# Patient Record
Sex: Male | Born: 1955 | Race: White | Hispanic: No | Marital: Single | Smoking: Current every day smoker
Health system: Southern US, Community
[De-identification: ages and names within clinical notes are randomized; demographics above are authoritative.]

## PROBLEM LIST (undated history)

## (undated) DIAGNOSIS — J449 Chronic obstructive pulmonary disease, unspecified: Secondary | ICD-10-CM

## (undated) DIAGNOSIS — M549 Dorsalgia, unspecified: Secondary | ICD-10-CM

## (undated) DIAGNOSIS — F1011 Alcohol abuse, in remission: Secondary | ICD-10-CM

## (undated) DIAGNOSIS — G8929 Other chronic pain: Secondary | ICD-10-CM

## (undated) DIAGNOSIS — Z8619 Personal history of other infectious and parasitic diseases: Secondary | ICD-10-CM

## (undated) DIAGNOSIS — R569 Unspecified convulsions: Secondary | ICD-10-CM

## (undated) DIAGNOSIS — M48061 Spinal stenosis, lumbar region without neurogenic claudication: Secondary | ICD-10-CM

## (undated) DIAGNOSIS — F329 Major depressive disorder, single episode, unspecified: Secondary | ICD-10-CM

## (undated) DIAGNOSIS — F32A Depression, unspecified: Secondary | ICD-10-CM

## (undated) DIAGNOSIS — M461 Sacroiliitis, not elsewhere classified: Secondary | ICD-10-CM

## (undated) DIAGNOSIS — G250 Essential tremor: Secondary | ICD-10-CM

## (undated) DIAGNOSIS — Z720 Tobacco use: Secondary | ICD-10-CM

## (undated) DIAGNOSIS — F419 Anxiety disorder, unspecified: Secondary | ICD-10-CM

## (undated) HISTORY — DX: Sacroiliitis, not elsewhere classified: M46.1

## (undated) HISTORY — DX: Anxiety disorder, unspecified: F41.9

## (undated) HISTORY — DX: Other chronic pain: G89.29

## (undated) HISTORY — DX: Alcohol abuse, in remission: F10.11

## (undated) HISTORY — DX: Major depressive disorder, single episode, unspecified: F32.9

## (undated) HISTORY — DX: Depression, unspecified: F32.A

## (undated) HISTORY — DX: Essential tremor: G25.0

## (undated) HISTORY — DX: Personal history of other infectious and parasitic diseases: Z86.19

## (undated) HISTORY — DX: Spinal stenosis, lumbar region without neurogenic claudication: M48.061

## (undated) HISTORY — DX: Dorsalgia, unspecified: M54.9

## (undated) HISTORY — DX: Chronic obstructive pulmonary disease, unspecified: J44.9

## (undated) HISTORY — DX: Tobacco use: Z72.0

---
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
## 1991-06-13 DIAGNOSIS — F1011 Alcohol abuse, in remission: Secondary | ICD-10-CM

## 1991-06-13 HISTORY — DX: Alcohol abuse, in remission: F10.11

## 1998-06-12 HISTORY — PX: ANTERIOR CERVICAL DECOMP/DISCECTOMY FUSION: SHX1161

## 2001-01-14 ENCOUNTER — Encounter: Admission: RE | Admit: 2001-01-14 | Discharge: 2001-01-14 | Payer: Self-pay | Admitting: *Deleted

## 2001-01-14 ENCOUNTER — Encounter: Payer: Self-pay | Admitting: Family Medicine

## 2001-01-31 ENCOUNTER — Inpatient Hospital Stay (HOSPITAL_COMMUNITY): Admission: RE | Admit: 2001-01-31 | Discharge: 2001-02-01 | Payer: Self-pay | Admitting: Neurosurgery

## 2001-01-31 ENCOUNTER — Encounter: Payer: Self-pay | Admitting: Neurosurgery

## 2001-02-21 ENCOUNTER — Encounter: Admission: RE | Admit: 2001-02-21 | Discharge: 2001-02-21 | Payer: Self-pay | Admitting: Neurosurgery

## 2001-02-21 ENCOUNTER — Encounter: Payer: Self-pay | Admitting: Neurosurgery

## 2001-04-05 ENCOUNTER — Encounter: Admission: RE | Admit: 2001-04-05 | Discharge: 2001-04-05 | Payer: Self-pay | Admitting: Neurosurgery

## 2001-04-05 ENCOUNTER — Encounter: Payer: Self-pay | Admitting: Neurosurgery

## 2002-07-21 ENCOUNTER — Emergency Department (HOSPITAL_COMMUNITY): Admission: EM | Admit: 2002-07-21 | Discharge: 2002-07-21 | Payer: Self-pay | Admitting: Emergency Medicine

## 2002-10-14 ENCOUNTER — Encounter: Payer: Self-pay | Admitting: Emergency Medicine

## 2002-10-14 ENCOUNTER — Emergency Department (HOSPITAL_COMMUNITY): Admission: EM | Admit: 2002-10-14 | Discharge: 2002-10-14 | Payer: Self-pay | Admitting: Emergency Medicine

## 2002-10-23 ENCOUNTER — Emergency Department (HOSPITAL_COMMUNITY): Admission: EM | Admit: 2002-10-23 | Discharge: 2002-10-23 | Payer: Self-pay | Admitting: Emergency Medicine

## 2004-01-20 ENCOUNTER — Ambulatory Visit (HOSPITAL_BASED_OUTPATIENT_CLINIC_OR_DEPARTMENT_OTHER): Admission: RE | Admit: 2004-01-20 | Discharge: 2004-01-20 | Payer: Self-pay | Admitting: Internal Medicine

## 2004-08-29 ENCOUNTER — Encounter: Admission: RE | Admit: 2004-08-29 | Discharge: 2004-08-29 | Payer: Self-pay | Admitting: Family Medicine

## 2004-09-12 ENCOUNTER — Encounter: Admission: RE | Admit: 2004-09-12 | Discharge: 2004-09-12 | Payer: Self-pay | Admitting: Family Medicine

## 2004-09-26 ENCOUNTER — Encounter: Admission: RE | Admit: 2004-09-26 | Discharge: 2004-09-26 | Payer: Self-pay | Admitting: Family Medicine

## 2005-12-18 ENCOUNTER — Emergency Department (HOSPITAL_COMMUNITY): Admission: EM | Admit: 2005-12-18 | Discharge: 2005-12-18 | Payer: Self-pay | Admitting: *Deleted

## 2005-12-25 ENCOUNTER — Ambulatory Visit: Payer: Self-pay | Admitting: Family Medicine

## 2006-01-15 ENCOUNTER — Ambulatory Visit: Payer: Self-pay | Admitting: *Deleted

## 2007-06-11 ENCOUNTER — Emergency Department (HOSPITAL_COMMUNITY): Admission: EM | Admit: 2007-06-11 | Discharge: 2007-06-11 | Payer: Self-pay | Admitting: *Deleted

## 2007-07-11 ENCOUNTER — Encounter: Admission: RE | Admit: 2007-07-11 | Discharge: 2007-07-11 | Payer: Self-pay | Admitting: Neurosurgery

## 2007-08-05 ENCOUNTER — Encounter: Admission: RE | Admit: 2007-08-05 | Discharge: 2007-08-05 | Payer: Self-pay | Admitting: Neurosurgery

## 2009-08-03 ENCOUNTER — Encounter: Admission: RE | Admit: 2009-08-03 | Discharge: 2009-08-03 | Payer: Self-pay | Admitting: Family Medicine

## 2010-10-28 NOTE — Procedures (Signed)
NAME:  Ronald [name], Ronald [name]              ACCOUNT NO.:  [id number]   MEDICAL RECORD NO.:  [id number]          PATIENT TYPE:  OUT   LOCATION:  SLEEP CENTER                 FACILITY:  [facility]   PHYSICIAN:  Clinton D. Maple Hudson, M.D. DATE OF BIRTH:  [birth year]   DATE OF ADMISSION:  01/20/2004  DATE OF DISCHARGE:  01/20/2004                              NOCTURNAL POLYSOMNOGRAM   REFERRING PHYSICIAN:  Dr. Jetty Duhamel   INDICATION FOR STUDY:  Insomnia with sleep apnea, difficulty maintaining  sleep, nonrestorative sleep.   EPWORTH SLEEPINESS SCALE:  14/24   BODY MASS INDEX:  29.7   WEIGHT:  220 pounds.   MEDICATIONS:  Protonix, Primidone.   SLEEP ARCHITECTURE:  The patient arrived a little late having just gotten  off of work.  He had a caffeine drink just before arrival and smoked a  cigarette before the monitor leads were applied.  Total sleep time 329  minutes with a sleep efficiency of 90%.  Stage I was 2%, Stage II 65%,  Stages III and IV 8%, REM 15% of total sleep time.  Latency to sleep onset  9.5 minutes.  Latency to REM was 97 minutes.  Awake after sleep onset 27  minutes.  Arousal index 36.8 per hour.   RESPIRATORY DATA:  Occasional respiratory events, within normal limits with  RDI 2.7/hr reflecting 15 hypopneas.  Events were not positional.  REM RDI  was 2.3/hr.   OXYGEN DATA:  Light to moderate snoring, loudest while supine.  Occasional  coughing.  Mild occasional oxygen desaturation to 84%.  Mean oxygen  saturation through the study was 90%.   CARDIAC DATA:  Sinus rhythm with occasional PAC, multifocal PVC and  couplets.   MOVEMENT/PARASOMNIA:  84 limb movements were recorded of which 32 were  associated with arousal or awakening for a periodic limb movement with  arousal index of 5.8/hr consistent with mild periodic limb movement  syndrome.   IMPRESSION/RECOMMENDATION:  No significant obstructive sleep disorder  breathing.  Lifestyle choices likely to affect sleep  quality.  Periodic limb  movement with arousal.                                   ______________________________                                Rennis Chris. Maple Hudson, M.D.                                Diplomate, American Board of Sleep Medicine    CDY/MEDQ  D:  01/24/2004 13:39:10  T:  01/25/2004 11:36:58  Job:  [id number]

## 2010-10-28 NOTE — Op Note (Signed)
[place]  Patient:    Ronald [name], Ronald [name] Visit Number: [id number] MRN: [id number]          Service Type: SUR Location: 5700 [id number] Attending Physician:  Barton Fanny Proc. Date: 01/31/01 Adm. Date:  01/31/2001                             Operative Report  PREOPERATIVE DIAGNOSES:  C4-5 and C5-6 degenerative disk disease, spondylosis, and disk herniation with radiculopathy.  POSTOPERATIVE DIAGNOSES:  C4-5 and C5-6 degenerative disk disease, spondylosis, and disk herniation with radiculopathy.  PROCEDURE:  C4-5 and C5-6 anterior cervical diskectomy and arthrodesis with iliac crest allograft and Tether cervical plating.  SURGEON:  Hewitt Shorts, M.D.  ASSISTANT:  Cristi Loron, M.D.  ANESTHESIA:  General endotracheal.  INDICATION:  The patient is a 55 year old man who presented with a right cervical radiculopathy and was found to have multilevel degenerative disk disease and spondylosis, worst at the C4-5 and C5-6 levels with superimposed disk herniation to the right at C5-6.  A decision made to proceed with elective anterior cervical diskectomy and arthrodesis.  DESCRIPTION OF PROCEDURE:  The patient was brought to the operating room, placed under general endotracheal anesthesia.  The patient was placed in 10 pounds of Holter traction.  Then the neck was prepped with Betadine soap and solution and draped in a sterile fashion.  A horizontal incision was made in the left side of the neck.  The line of the incision was infiltrated with local anesthetic with epinephrine.  The incision itself was made with a Shaw scalpel at a temperature of 120.  Dissection was carried down through the subcutaneous tissue and platysma.  Dissection was then carried out through the avascular plane, leaving the sternocleidomastoid, carotid artery, and jugular vein laterally and the trachea and esophagus medially.  The ventral aspect of the  vertebral column was identified and a localizing x-ray taken and the C4-5 and C5-6 intervertebral disk space identified.  Diskectomy was begun at each level with incision of the annulus and continued with microcurettes and pituitary rongeurs.  At each level we encountered advanced spondylitic degeneration.  The cartilaginous end plates of the corresponding vertebrae were removed using microcurettes as well as the [facility] Max drill.  The microscope was then draped and brought into the field to provide additional magnification, illumination, and visualization, and the remainder of the procedure was performed using microdissection and microsurgical technique. Posterior osteophytic overgrowth was removed using the [facility] Max drill as well as the 2 mm Kerrison punch with the thin foot plate.  Foraminotomy was performed bilaterally, and the spondylitic posterior longitudinal ligament was removed, as was the disk herniation.  In the end, good decompression of the thecal sac and nerve roots bilaterally was achieved at each level, and then once the decompression was completed, hemostasis was established with the use of Gelfoam soaked in thrombin, and then we proceeded with the arthrodesis.  We selected wedges of iliac crest allograft.  They were cut and shaped to size and positioned in the intervertebral disk space and countersunk.  Anterior osteophytic overgrowth was removed using the double-action rongeurs as well as the [facility] Max drill.  Then we selected a 38 mm Tether cervical plate.  This was positioned over the fusion construct and secured to the C4 and C6 vertebrae with a pair of 4.0 x 14 mm variable-angled screws and a single 4.0 x 14 mm  variable-angled screw at C5.  All screws had final tightening done.  An x-ray was done, which showed the grafts and plate and screws in good position, and the overall alignment was good.  We then once again checked for hemostasis, which was established  and confirmed, and we proceeded with closure.  The platysma was closed with interrupted inverted 2-0 undyed Vicryl sutures, the subcutaneous and subcuticular were closed with interrupted inverted 3-0 undyed Vicryl sutures, and the skin was reapproximated with Dermabond.  The patient tolerated the procedure well.  The estimated blood loss was 50 cc.  Sponge and needle count were correct.  Following surgery, the patient had Holter traction discontinued once the grafts were in place, was placed in a soft cervical collar, reversed from the anesthetic, to be extubated and transferred to the recovery room for further care. Attending Physician:  Barton Fanny DD:  01/31/01 TD:  02/01/01 Job: [phone] [id number]

## 2010-10-28 NOTE — H&P (Signed)
[place]  Patient:    Ronald [name], Ronald [name] Visit Number: [id number] MRN: [id number]          Service Type: SUR Location: [facility] 3172 06 Attending Physician:  Barton Fanny Dictated by:   Hewitt Shorts, M.D. Adm. Date:  01/31/2001                           History and Physical  CHIEF COMPLAINT: The patient is a 55 year old right-handed white male, who was evaluated for a right cervical radiculopathy.  HISTORY OF PRESENT ILLNESS: He injured himself at work a little over a month ago when pushing a roll of rubber back onto a forklift that was above his head.  He developed pain in the neck running down through the right shoulder, arm, and forearm.  He has had numbness in the right arm, forearm, and hand, although he cannot identify it to any specific digit.  He is not aware of any weakness.  He has been working on [employer] duty and he has taken anti-inflammatories without relief.  He has used muscle relaxants, which actually have tended to aggravate the discomfort.  He was evaluated with an MRI scan.  PAST MEDICAL HISTORY: He does not describe any history of hypertension, myocardial infarction, cancer, stroke, diabetes, peptic ulcer disease, or lung disease.  PAST SURGICAL HISTORY: No previous surgeries.  ALLERGIES: He does report an allergy to CODEINE.  CURRENT MEDICATIONS: None.  FAMILY HISTORY: His mother is in good health at age 85.  His father died at age 65 of unknown causes.  SOCIAL HISTORY: The patient is separated from his wife.  He works as a Company secretary.  He smokes two packs a day and has been smoking for 26 years.  He does not drink alcohol.  He denies history of substance abuse.  REVIEW OF SYSTEMS: Notable for that described in the History of Present Illness and Past Medical History, otherwise unremarkable.  PHYSICAL EXAMINATION:  GENERAL: He is a well-developed, well-nourished white male, who appears  in discomfort and his mobility is somewhat limited.  VITAL SIGNS: Temperature 97 degrees, pulse 88, blood pressure 142/80, respiratory rate 20.  Height 6 feet 1 inch.  Weight 188 pounds.  LUNGS: Clear to auscultation.  He has symmetric respiratory excursion.  HEART: Regular rate and rhythm.  Normal S1 and S2.  No murmur.  ABDOMEN: Soft, nontender.  Bowel sounds present.  EXTREMITIES: No clubbing, cyanosis, or edema.  MUSCULOSKELETAL: Mild tenderness to palpation in the mid to lower cervical region posteriorly.  Mobility of the neck is intact but he has discomfort with extension as well as with lateral flexion to either side.  NEUROLOGIC: Strength 5/5 throughout the upper extremities except for the right triceps, which is 4-/5.  Sensory examination shows sensation to pinprick is more intense in the left hand in all the digits as compared to the right hand, with no specific dermatomal deficits to pinprick.  Reflex at the left biceps is 2 and the right is 1, brachial radialis and triceps are 1 bilaterally, quadriceps are 1 bilaterally, gastrocnemius trace bilaterally.  Toes downgoing bilaterally.  Normal gait and stance.  DIAGNOSTIC STUDIES: MRI of the cervical spine shows degenerative disk disease and spondylosis through all the cervical disk levels, most advanced at C4-5 and C5-6, and at C5-6 there is a moderately large central to the right acute disk herniation with significant spinal canal narrowing as well at C4-5 and C5-6.  IMPRESSION: Acute right cervical radiculopathy with right triceps weakness secondary to spondylosis and degenerative disk disease, with superimposed central and to the right C5-6 cervical disk herniation.  The patient has weakness of his triceps on the right side on examination.  PLAN: The patient is being admitted for two-level C4-5 and C5-6 anterior cervical diskectomy and arthrodesis with iliac crest allograft and cervical plating.  We have discussed  alternatives to surgery, the nature of the surgical procedure itself, typical length of surgery and hospital stay and overall recuperation, limitations postoperatively, need for postoperative mobilization and a soft cervical collar, and the risks of surgery including the risks of infection, bleeding with possible need for transfusion, the risk of nerve dysfunction with pain, weakness, numbness, or paresthesias, the risk of spinal cord dysfunction with paralysis of all four limbs and quadriplegia, the risk of failure of the arthrodesis, particularly in light of his heavy smoking history, the anesthetic risks of myocardial infarction, stroke, pneumonia, and death, all of which again are increased due to his significant smoking history.  Understanding all this the patient does wish to proceed with surgery.  He was strongly counseled to quit or at least reduce the amount of smoking that he does, and he understands the importance of that. Dictated by:   Hewitt Shorts, M.D. Attending Physician:  Barton Fanny DD:  01/31/01 TD:  01/31/01 Job: [phone] [id number]

## 2013-02-12 ENCOUNTER — Ambulatory Visit: Payer: Self-pay | Admitting: Neurology

## 2013-02-13 ENCOUNTER — Encounter: Payer: Self-pay | Admitting: Neurology

## 2013-02-13 ENCOUNTER — Telehealth: Payer: Self-pay | Admitting: Neurology

## 2013-02-13 ENCOUNTER — Ambulatory Visit: Payer: Self-pay | Admitting: Neurology

## 2013-02-13 NOTE — Telephone Encounter (Signed)
Pt no showed 2 appts 02/12/13 and 02/13/13. Per Dr. Arbutus Leas, pt is not to be rescheduled again with our office. Contacted [facility] # [phone] and made them aware of this. Also sent note to pt advising we could not r/s  Ronald S.

## 2013-03-20 ENCOUNTER — Emergency Department: Payer: Self-pay | Admitting: Emergency Medicine

## 2013-09-03 ENCOUNTER — Other Ambulatory Visit: Payer: Self-pay | Admitting: Family Medicine

## 2013-09-03 DIAGNOSIS — F0390 Unspecified dementia without behavioral disturbance: Secondary | ICD-10-CM

## 2013-09-10 ENCOUNTER — Inpatient Hospital Stay
Admission: RE | Admit: 2013-09-10 | Discharge: 2013-09-10 | Disposition: A | Discharge status: Home / Self Care | Payer: Self-pay | Source: Ambulatory Visit | Attending: Family Medicine | Admitting: Family Medicine

## 2013-09-24 ENCOUNTER — Encounter: Payer: Self-pay | Admitting: Neurology

## 2013-09-24 ENCOUNTER — Ambulatory Visit (INDEPENDENT_AMBULATORY_CARE_PROVIDER_SITE_OTHER): Payer: Medicare Other | Admitting: Neurology

## 2013-09-24 ENCOUNTER — Other Ambulatory Visit: Payer: Self-pay | Admitting: Neurology

## 2013-09-24 VITALS — BP 112/68 | HR 68 | Temp 97.2°F | Resp 18 | Ht 72.0 in | Wt 138.9 lb

## 2013-09-24 DIAGNOSIS — G252 Other specified forms of tremor: Secondary | ICD-10-CM

## 2013-09-24 DIAGNOSIS — F09 Unspecified mental disorder due to known physiological condition: Secondary | ICD-10-CM

## 2013-09-24 DIAGNOSIS — G250 Essential tremor: Secondary | ICD-10-CM

## 2013-09-24 DIAGNOSIS — R4189 Other symptoms and signs involving cognitive functions and awareness: Secondary | ICD-10-CM

## 2013-09-24 LAB — VITAMIN B12: VITAMIN B 12: 410 pg/mL (ref 211–911)

## 2013-09-24 LAB — TSH: TSH: 1.719 u[IU]/mL (ref 0.350–4.500)
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
## 2013-09-24 NOTE — Patient Instructions (Addendum)
1.  We will check an MRI of the brain 2.  We will check vitamin B12 and thyroid levels 3.  Would refer to neuropsychological testing. 4.  Follow up afterwards.  5.  Continue trying to stop smoking.

## 2013-09-29 ENCOUNTER — Telehealth: Payer: Self-pay | Admitting: *Deleted

## 2013-09-29 ENCOUNTER — Ambulatory Visit
Admission: RE | Admit: 2013-09-29 | Discharge: 2013-09-29 | Disposition: A | Discharge status: Home / Self Care | Payer: Medicare Other | Source: Ambulatory Visit | Attending: Family Medicine | Admitting: Family Medicine

## 2013-09-29 DIAGNOSIS — F0390 Unspecified dementia without behavioral disturbance: Secondary | ICD-10-CM

## 2013-09-29 MED ORDER — GADOBENATE DIMEGLUMINE 529 MG/ML IV SOLN
13.0000 mL | Freq: Once | INTRAVENOUS | Status: AC | PRN
  Administered 2013-09-29: 13 mL via INTRAVENOUS

## 2013-09-29 NOTE — Telephone Encounter (Signed)
Patient has appt with Dr Lurena NidaBourn  On 10/22/13 @10  am  He is aware notes faxed to [phone]

## 2013-12-24 ENCOUNTER — Ambulatory Visit: Payer: Medicare Other | Admitting: Neurology

## 2013-12-25 ENCOUNTER — Telehealth: Payer: Self-pay | Admitting: Neurology

## 2013-12-25 ENCOUNTER — Encounter: Payer: Self-pay | Admitting: Neurology

## 2013-12-25 NOTE — Telephone Encounter (Signed)
Pt no showed 12/24/13 appt w/ Dr. Everlena CooperJaffe. No show letter mailed to pt / Sherri S.

## 2013-12-28 ENCOUNTER — Emergency Department (HOSPITAL_COMMUNITY)
Admission: EM | Admit: 2013-12-28 | Discharge: 2013-12-28 | Disposition: A | Discharge status: Home / Self Care | Payer: Medicare Other | Attending: Emergency Medicine | Admitting: Emergency Medicine

## 2013-12-28 ENCOUNTER — Emergency Department (HOSPITAL_COMMUNITY): Payer: Medicare Other

## 2013-12-28 ENCOUNTER — Encounter (HOSPITAL_COMMUNITY): Payer: Self-pay | Admitting: Emergency Medicine

## 2013-12-28 DIAGNOSIS — F17200 Nicotine dependence, unspecified, uncomplicated: Secondary | ICD-10-CM | POA: Insufficient documentation

## 2013-12-28 DIAGNOSIS — Z7982 Long term (current) use of aspirin: Secondary | ICD-10-CM | POA: Diagnosis not present

## 2013-12-28 DIAGNOSIS — R000 Tachycardia, unspecified: Secondary | ICD-10-CM | POA: Diagnosis not present

## 2013-12-28 DIAGNOSIS — G40909 Epilepsy, unspecified, not intractable, without status epilepticus: Secondary | ICD-10-CM | POA: Insufficient documentation

## 2013-12-28 DIAGNOSIS — Z79899 Other long term (current) drug therapy: Secondary | ICD-10-CM | POA: Diagnosis not present

## 2013-12-28 DIAGNOSIS — R569 Unspecified convulsions: Secondary | ICD-10-CM

## 2013-12-28 LAB — URINALYSIS, ROUTINE W REFLEX MICROSCOPIC
BILIRUBIN URINE: NEGATIVE
GLUCOSE, UA: NEGATIVE mg/dL
Ketones, ur: NEGATIVE mg/dL
LEUKOCYTES UA: NEGATIVE
Nitrite: NEGATIVE
PROTEIN: NEGATIVE mg/dL
Specific Gravity, Urine: 1.011 (ref 1.005–1.030)
Urobilinogen, UA: 1 mg/dL (ref 0.0–1.0)
pH: 6 (ref 5.0–8.0)

## 2013-12-28 LAB — COMPREHENSIVE METABOLIC PANEL
ALBUMIN: 4.1 g/dL (ref 3.5–5.2)
ALT: 9 U/L (ref 0–53)
ANION GAP: 15 (ref 5–15)
AST: 18 U/L (ref 0–37)
Alkaline Phosphatase: 61 U/L (ref 39–117)
BUN: 11 mg/dL (ref 6–23)
CHLORIDE: 98 meq/L (ref 96–112)
CO2: 23 meq/L (ref 19–32)
Calcium: 9.1 mg/dL (ref 8.4–10.5)
Creatinine, Ser: 0.71 mg/dL (ref 0.50–1.35)
GFR calc non Af Amer: >90 mL/min (ref >90)
GLUCOSE: 107 mg/dL — AB (ref 70–99)
Potassium: 4.6 mEq/L (ref 3.7–5.3)
SODIUM: 136 meq/L — AB (ref 137–147)
Total Bilirubin: 0.8 mg/dL (ref 0.3–1.2)
Total Protein: 7.1 g/dL (ref 6.0–8.3)

## 2013-12-28 LAB — CBC
HEMATOCRIT: 39.9 % (ref 39.0–52.0)
HEMOGLOBIN: 13.7 g/dL (ref 13.0–17.0)
MCH: 33 pg (ref 26.0–34.0)
MCHC: 34.3 g/dL (ref 30.0–36.0)
MCV: 96.1 fL (ref 78.0–100.0)
Platelets: 204 10*3/uL (ref 150–400)
RBC: 4.15 MIL/uL — ABNORMAL LOW (ref 4.22–5.81)
RDW: 13.1 % (ref 11.5–15.5)
WBC: 10.6 10*3/uL — AB (ref 4.0–10.5)

## 2013-12-28 LAB — URINE MICROSCOPIC-ADD ON

## 2013-12-28 LAB — I-STAT TROPONIN, ED: Troponin i, poc: 0.01 ng/mL (ref 0.00–0.08)

## 2013-12-28 LAB — TROPONIN I: Troponin I: <0.3 ng/mL (ref <0.30)

## 2013-12-28 MED ORDER — SODIUM CHLORIDE 0.9 % IV BOLUS (SEPSIS)
1000.0000 mL | Freq: Once | INTRAVENOUS | Status: AC
  Administered 2013-12-28: 1000 mL via INTRAVENOUS

## 2013-12-28 MED ORDER — LORAZEPAM 2 MG/ML IJ SOLN
1.0000 mg | Freq: Once | INTRAMUSCULAR | Status: AC
  Administered 2013-12-28: 1 mg via INTRAVENOUS
  Filled 2013-12-28: qty 1

## 2013-12-28 MED ORDER — CITALOPRAM HYDROBROMIDE 20 MG PO TABS: 20.0000 mg | ORAL_TABLET | Freq: Every day | ORAL | Status: DC

## 2013-12-28 NOTE — ED Notes (Signed)
Per son: Pt was sitting in chair and "everything seemed fine." Son then reports "all of a sudden his jaw became clenched, he was breathing really shallow and then he just stopped breathing. He turned blue. His whole body was shaking." Per family and pt, pt had decrease in Valium from 30mg  to 10mg  per day.  Pt denies any chest pain or palpitations. Pt states "I feel just fine."

## 2013-12-28 NOTE — ED Provider Notes (Signed)
TIME SEEN: 5:45 PM  CHIEF COMPLAINT: Seizure  HPI: Patient is a 58 y.o. M with prior history of heavy alcohol use, chronic pain and resting tremors to recently stop taking Percocet and is tapering down on his Valium for the past several weeks who presents to the emergency department with a generalized tonic-clonic seizure today. Sister witnessed the seizure. She states she began staring off into space and then grabbed his arms around his chest, his eyes in the back of his head and began shaking all over. Family reports this lasted for several minutes and then he was post ictal afterwards. She states she did foam at the mouth and had blue lips. He has had a seizure before when he stopped drinking but states it was over 20 years ago and has not had alcohol in several years. He states that he has been on Valium chronically but has been decreasing his dose recently per Dr. Clarene DukeLittle his PCPs request. He was taking 30 mg a day and has now dropped down to 10 mg a day over the past 3 weeks. No recent head injury. He is not on anticoagulation. He does have a mild headache but no numbness, tingling or focal weakness. No chest pain or shortness of breath. No abdominal or back pain. He is not on other medications such as Wellbutrin, tramadol. No family history of epilepsy.  ROS: See HPI Constitutional: no fever  Eyes: no drainage  ENT: no runny nose   Cardiovascular:  no chest pain  Resp: no SOB  GI: no vomiting GU: no dysuria Integumentary: no rash  Allergy: no hives  Musculoskeletal: no leg swelling  Neurological: no slurred speech ROS otherwise negative  PAST MEDICAL HISTORY/PAST SURGICAL HISTORY:  History reviewed. No pertinent past medical history.  MEDICATIONS:  Prior to Admission medications   Medication Sig Start Date End Date Taking? Authorizing Provider  aspirin 325 MG tablet Take 325 mg by mouth daily. For pain   Yes Historical Provider, MD  diazepam (VALIUM) 5 MG tablet Take 5 mg by mouth 2  (two) times daily.   Yes Historical Provider, MD  naproxen sodium (ANAPROX) 220 MG tablet Take 440 mg by mouth daily as needed (for pain).   Yes Historical Provider, MD  oxyCODONE-acetaminophen (PERCOCET) 10-325 MG per tablet Take 1 tablet by mouth every 4 (four) hours as needed for pain. Neck and back pain   Yes Historical Provider, MD    ALLERGIES:  Allergies  Allergen Reactions  . Antihistamines, Diphenhydramine-Type Other (See Comments)    Speeds up heart rate.  . Codeine Nausea And Vomiting    SOCIAL HISTORY:  History  Substance Use Topics  . Smoking status: Current Every Day Smoker -- 1.00 packs/day for 26 years    Types: Cigarettes  . Smokeless tobacco: Not on file     Comment: patient is aware he needs to stop   . Alcohol Use: No    FAMILY HISTORY: Family History  Problem Relation Age of Onset  . Cancer Mother     lymphoma  . Cancer Brother     lung     EXAM: BP 108/84  Pulse 95  Temp(Src) 98.1 F (36.7 C) (Oral)  Resp 18  Ht 6' (1.829 m)  Wt 132 lb (59.875 kg)  BMI 17.90 kg/m2  SpO2 95% CONSTITUTIONAL: Alert and oriented and responds appropriately to questions. Well-appearing; well-nourished HEAD: Normocephalic EYES: Conjunctivae clear, PERRL ENT: normal nose; no rhinorrhea; moist mucous membranes; pharynx without lesions noted NECK: Supple, no  meningismus, no LAD  CARD: Regular and tachycardic; S1 and S2 appreciated; no murmurs, no clicks, no rubs, no gallops RESP: Normal chest excursion without splinting or tachypnea; breath sounds clear and equal bilaterally; no wheezes, no rhonchi, no rales,  ABD/GI: Normal bowel sounds; non-distended; soft, non-tender, no rebound, no guarding BACK:  The back appears normal and is non-tender to palpation, there is no CVA tenderness EXT: Normal ROM in all joints; non-tender to palpation; no edema; normal capillary refill; no cyanosis    SKIN: Normal color for age and race; warm NEURO: Moves all extremities equally,  patient is slightly tremulous, sensation to light touch intact diffusely, cranial nerves II through XII intact, no pronator drift, strength 5/5 her extremities PSYCH: The patient's mood and manner are appropriate. Grooming and personal hygiene are appropriate.  MEDICAL DECISION MAKING: Patient here after seizure that may be secondary to withdrawal from benzodiazepines. He is tachycardic. We'll give IV fluids and Ativan. He does appear slightly tremulous on exam. Denies any other illicit drug use or recent alcohol use. Is otherwise neurologically intact. We'll obtain labs, urine and a CT of his head. He has outpatient neurology followup with Dr. Everlena Cooper due to his history of essential tremor and memory loss. He understands he may need an outpatient MRI or EEG. I do not feel he needs to be started on antiepileptics at this time. Patient agrees with this plan.  ED PROGRESS: Patient's workup in the ED has been unremarkable. His heart rate has improved with IV Ativan and IV fluids. He now states that he does take bupropion for depression. He has been on this for the past several weeks. Discussed with him of this medication along with tapering off of his Valium may have contributed to his seizure. I do not feel he needs to be started on antiepileptics at this time. He has neurology for followup CTs are a scheduled for an outpatient brain MRI. He states he does not drive but have talked to him about other seizure restrictions. He will continue his Valium at 10 mg a day and slowly taper over the next several weeks. We'll switch his antidepressant to Celexa. Discussed strict return precautions and supportive care instructions. Patient and family verbalize understanding and are comfortable with plan.       EKG Interpretation  Date/Time:  Sunday December 28 2013 17:14:48 EDT Ventricular Rate:  140 PR Interval:  94 QRS Duration: 80 QT Interval:  307 QTC Calculation: 468 R Axis:   -98 Text Interpretation:  Sinus  tachycardia Left anterior fascicular block Probable right ventricular hypertrophy Confirmed by WARD,  DO, KRISTEN ([id number]) on 12/28/2013 6:31:20 PM        Layla Maw Ward, DO 12/28/13 2305

## 2013-12-28 NOTE — ED Notes (Signed)
Per EMS: Pt had witnessed seizure this afternoon while sitting in chair. Per family, pt became unresponsive and was shaking lasting appx 1 minutes. Pt had no memory of seizure, denies injury. Pt now AO x4. States she recently stopped taking Percocet and Valium on 7/4.\ Pt had no hx of seizures.

## 2013-12-28 NOTE — Discharge Instructions (Signed)
Your seizure today with likely due to a fast taper of your Valium. It also likely occurred because of being on bupropion which is an antidepressant. I recommended you stop bupropion and start Celexa. Please take Valium 10 mg a day for one week then 7.5 mg a day for 1 week then 5 mg a day for 1 week then 2.5 mg a day for 1 week then stop.   Seizure, Adult A seizure is abnormal electrical activity in the brain. Seizures usually last from 30 seconds to 2 minutes. There are various types of seizures. Before a seizure, you may have a warning sensation (aura) that a seizure is about to occur. An aura may include the following symptoms:   Fear or anxiety.  Nausea.  Feeling like the room is spinning (vertigo).  Vision changes, such as seeing flashing lights or spots. Common symptoms during a seizure include:  A change in attention or behavior (altered mental status).  Convulsions with rhythmic jerking movements.  Drooling.  Rapid eye movements.  Grunting.  Loss of bladder and bowel control.  Bitter taste in the mouth.  Tongue biting. After a seizure, you may feel confused and sleepy. You may also have an injury resulting from convulsions during the seizure. HOME CARE INSTRUCTIONS   If you are given medicines, take them exactly as prescribed by your health care provider.  Keep all follow-up appointments as directed by your health care provider.  Do not swim or drive or engage in risky activity during which a seizure could cause further injury to you or others until your health care provider says it is OK.  Get adequate rest.  Teach friends and family what to do if you have a seizure. They should:  Lay you on the ground to prevent a fall.  Put a cushion under your head.  Loosen any tight clothing around your neck.  Turn you on your side. If vomiting occurs, this helps keep your airway clear.  Stay with you until you recover.  Know whether or not you need emergency  care. SEEK IMMEDIATE MEDICAL CARE IF:  The seizure lasts longer than 5 minutes.  The seizure is severe or you do not wake up immediately after the seizure.  You have an altered mental status after the seizure.  You are having more frequent or worsening seizures. Someone should drive you to the emergency department or call local emergency services (911 in [place] MAKE SURE YOU:  Understand these instructions.  Will watch your condition.  Will get help right away if you are not doing well or get worse. Document Released: 05/26/2000 Document Revised: 03/19/2013 Document Reviewed: 01/08/2013 [facility] Patient Information 2015 [place], [place]. This information is not intended to replace advice given to you by your health care provider. Make sure you discuss any questions you have with your health care provider.     Driving and Equipment Restrictions Some medical problems make it dangerous to drive, ride a bike, or use machines. Some of these problems are:  A hard blow to the head (concussion).  Passing out (fainting).  Twitching and shaking (seizures).  Low blood sugar.  Taking medicine to help you relax (sedatives).  Taking pain medicines.  Wearing an eye patch.  Wearing splints. This can make it hard to use parts of your body that you need to drive safely. HOME CARE   Do not drive until your doctor says it is okay.  Do not use machines until your doctor says it is okay. You  may need a form signed by your doctor (medical release) before you can drive again. You may also need this form before you do other tasks where you need to be fully alert. MAKE SURE YOU:  Understand these instructions.  Will watch your condition.  Will get help right away if you are not doing well or get worse. Document Released: 07/06/2004 Document Revised: 08/21/2011 Document Reviewed: 10/06/2009 [facility] Patient Information 2015 [place]. This information is not intended to replace  advice given to you by your health care provider. Make sure you discuss any questions you have with your health care provider.

## 2014-01-08 ENCOUNTER — Ambulatory Visit (INDEPENDENT_AMBULATORY_CARE_PROVIDER_SITE_OTHER): Payer: Medicare Other | Admitting: Neurology

## 2014-01-08 ENCOUNTER — Encounter: Payer: Self-pay | Admitting: Neurology

## 2014-01-08 VITALS — BP 98/60 | HR 60 | Temp 97.5°F | Resp 16 | Ht 72.0 in | Wt 130.4 lb

## 2014-01-08 DIAGNOSIS — G250 Essential tremor: Secondary | ICD-10-CM

## 2014-01-08 DIAGNOSIS — G252 Other specified forms of tremor: Secondary | ICD-10-CM

## 2014-01-08 DIAGNOSIS — R569 Unspecified convulsions: Secondary | ICD-10-CM

## 2014-01-08 DIAGNOSIS — F09 Unspecified mental disorder due to known physiological condition: Secondary | ICD-10-CM

## 2014-01-08 DIAGNOSIS — R4189 Other symptoms and signs involving cognitive functions and awareness: Secondary | ICD-10-CM | POA: Insufficient documentation

## 2014-01-08 HISTORY — DX: Essential tremor: G25.0
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
## 2014-01-08 NOTE — Patient Instructions (Addendum)
I do suspect that the seizure was provoked by the bupropion and getting of the Valium too quickly.  But to be sure, we need to evaluate for any reason to start an anti-seizure medication. 1.  [facility] 6:45[facility] 2.  EEG 3.  Discuss with Dr. Clarene DukeLittle regarding a plan to taper off the Valium much slower. 4.  We will send you for cognitive testing and follow up right afterwards. 5.  When you follow up, we can discuss starting Mysoline for the tremor. 6.  Smoking cessation

## 2014-01-16 ENCOUNTER — Ambulatory Visit (HOSPITAL_COMMUNITY): Admission: RE | Admit: 2014-01-16 | Payer: Medicare Other | Source: Ambulatory Visit

## 2014-02-03 ENCOUNTER — Ambulatory Visit (INDEPENDENT_AMBULATORY_CARE_PROVIDER_SITE_OTHER): Payer: Medicare Other | Admitting: Neurology

## 2014-02-03 DIAGNOSIS — G250 Essential tremor: Secondary | ICD-10-CM

## 2014-02-03 DIAGNOSIS — R4189 Other symptoms and signs involving cognitive functions and awareness: Secondary | ICD-10-CM

## 2014-02-03 DIAGNOSIS — R569 Unspecified convulsions: Secondary | ICD-10-CM

## 2014-02-04 NOTE — Procedures (Signed)
ELECTROENCEPHALOGRAM REPORT  Date of Study: 02/03/2014  Patient's Name: Ronald [name] MRN: [id number] Date of Birth: 05-21-1956  Indication: 58 year old man with history of memory deficits who presents for isolated seizure in setting of bupropion and Valium withdrawal.  Medications: Celexa Valium percocet  Technical Summary: This is a multichannel digital EEG recording, using the international 10-20 placement system.  Spike detection software was employed.  Description: The EEG background is symmetric, with a well-developed posterior dominant rhythm of 8-9 Hz, which is reactive to eye opening and closing.  Mildly increased diffuse beta activity is seen.  No focal or generalized abnormalities are seen.  No focal or generalized epileptiform discharges are seen.  Stage II sleep is not seen.  Hyperventilation and photic stimulation were performed, and produced no abnormalities.  ECG revealed normal cardiac rate and rhythm.  Impression: This is a normal routine EEG of the awake and drowsy states, with activating procedures.  Excess beta activity is likely pharmacologic effect from the Valium and Percocet.  A normal study does not rule out the possibility of a seizure disorder in this patient.  Adam R. Everlena Cooper, DO

## 2014-03-10 ENCOUNTER — Telehealth: Payer: Self-pay | Admitting: *Deleted

## 2014-03-10 NOTE — Telephone Encounter (Signed)
Patient has appt with Dr Leonides CaveZelson on 04/08/14 and [facility]  He did not get message on the one that was scheduled in August he will make a follow up appt with Dr Everlena Cooperjaffe for Nov

## 2014-03-10 NOTE — Telephone Encounter (Signed)
Patient calling in reference to a referral made in July and results of his MRI Call back number [phone] or [phone]

## 2014-03-16 ENCOUNTER — Ambulatory Visit (HOSPITAL_COMMUNITY): Payer: Medicare Other | Attending: Neurology

## 2014-04-08 ENCOUNTER — Telehealth: Payer: Self-pay | Admitting: Neurology

## 2014-04-08 NOTE — Telephone Encounter (Signed)
[place] called to let us know this pt has cancelled his neuropsych testing and did not r/s / Sherri S.

## 2014-04-09 ENCOUNTER — Ambulatory Visit (HOSPITAL_COMMUNITY): Payer: Medicare Other

## 2014-04-09 ENCOUNTER — Ambulatory Visit (HOSPITAL_COMMUNITY): Admission: RE | Admit: 2014-04-09 | Payer: Medicare Other | Source: Ambulatory Visit

## 2014-04-10 ENCOUNTER — Ambulatory Visit (HOSPITAL_COMMUNITY)
Admission: RE | Admit: 2014-04-10 | Discharge: 2014-04-10 | Disposition: A | Discharge status: Home / Self Care | Payer: Medicare Other | Source: Ambulatory Visit | Attending: Radiology | Admitting: Radiology

## 2014-04-10 ENCOUNTER — Telehealth: Payer: Self-pay | Admitting: *Deleted

## 2014-04-10 DIAGNOSIS — R634 Abnormal weight loss: Secondary | ICD-10-CM | POA: Insufficient documentation

## 2014-04-10 DIAGNOSIS — R569 Unspecified convulsions: Secondary | ICD-10-CM | POA: Insufficient documentation

## 2014-04-10 DIAGNOSIS — R251 Tremor, unspecified: Secondary | ICD-10-CM | POA: Diagnosis not present

## 2014-04-10 DIAGNOSIS — Z9181 History of falling: Secondary | ICD-10-CM | POA: Insufficient documentation

## 2014-04-10 DIAGNOSIS — G319 Degenerative disease of nervous system, unspecified: Secondary | ICD-10-CM | POA: Insufficient documentation

## 2014-04-10 DIAGNOSIS — R531 Weakness: Secondary | ICD-10-CM | POA: Insufficient documentation

## 2014-04-10 NOTE — Telephone Encounter (Signed)
Message copied by Fredirick MaudlinVAN DER GLAS, Masiah Lewing E on Fri Apr 10, 2014  3:40 PM ------      Message from: JAFFE, ADAM R      Created: Fri Apr 10, 2014  2:19 PM       MRI of the brain reveals no specific cause of seizure.      ----- Message -----         From: Rad Results In Interface         Sent: 04/10/2014   2:04 PM           To: Cira ServantAdam Robert Jaffe, DO                   ------

## 2014-04-30 ENCOUNTER — Emergency Department: Payer: Self-pay | Admitting: Emergency Medicine

## 2014-05-04 ENCOUNTER — Telehealth: Payer: Self-pay | Admitting: *Deleted

## 2014-05-04 NOTE — Telephone Encounter (Signed)
Dr Everlena CooperJaffe please see note below  Regarding MRI of neck and advise
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
## 2014-05-04 NOTE — Telephone Encounter (Signed)
There is no hospital encounter or [facility]

## 2014-05-25 ENCOUNTER — Emergency Department: Payer: Self-pay | Admitting: Emergency Medicine

## 2014-07-28 ENCOUNTER — Emergency Department (HOSPITAL_COMMUNITY): Payer: Medicare Other

## 2014-07-28 ENCOUNTER — Encounter (HOSPITAL_COMMUNITY): Payer: Self-pay | Admitting: *Deleted

## 2014-07-28 ENCOUNTER — Inpatient Hospital Stay (HOSPITAL_COMMUNITY)
Admission: EM | Admit: 2014-07-28 | Discharge: 2014-07-29 | DRG: 100 | Disposition: A | Discharge status: Home Health | Payer: Medicare Other | Attending: Internal Medicine | Admitting: Internal Medicine

## 2014-07-28 ENCOUNTER — Inpatient Hospital Stay (HOSPITAL_COMMUNITY): Payer: Medicare Other

## 2014-07-28 DIAGNOSIS — R32 Unspecified urinary incontinence: Secondary | ICD-10-CM | POA: Diagnosis present

## 2014-07-28 DIAGNOSIS — R079 Chest pain, unspecified: Secondary | ICD-10-CM | POA: Diagnosis present

## 2014-07-28 DIAGNOSIS — E876 Hypokalemia: Secondary | ICD-10-CM

## 2014-07-28 DIAGNOSIS — Z791 Long term (current) use of non-steroidal anti-inflammatories (NSAID): Secondary | ICD-10-CM

## 2014-07-28 DIAGNOSIS — G250 Essential tremor: Secondary | ICD-10-CM | POA: Diagnosis present

## 2014-07-28 DIAGNOSIS — G8929 Other chronic pain: Secondary | ICD-10-CM | POA: Diagnosis not present

## 2014-07-28 DIAGNOSIS — G40909 Epilepsy, unspecified, not intractable, without status epilepticus: Secondary | ICD-10-CM | POA: Diagnosis present

## 2014-07-28 DIAGNOSIS — F13239 Sedative, hypnotic or anxiolytic dependence with withdrawal, unspecified: Secondary | ICD-10-CM | POA: Diagnosis not present

## 2014-07-28 DIAGNOSIS — D72829 Elevated white blood cell count, unspecified: Secondary | ICD-10-CM | POA: Diagnosis not present

## 2014-07-28 DIAGNOSIS — Z7982 Long term (current) use of aspirin: Secondary | ICD-10-CM | POA: Diagnosis not present

## 2014-07-28 DIAGNOSIS — M4854XA Collapsed vertebra, not elsewhere classified, thoracic region, initial encounter for fracture: Secondary | ICD-10-CM | POA: Diagnosis not present

## 2014-07-28 DIAGNOSIS — F17210 Nicotine dependence, cigarettes, uncomplicated: Secondary | ICD-10-CM | POA: Diagnosis not present

## 2014-07-28 DIAGNOSIS — E43 Unspecified severe protein-calorie malnutrition: Secondary | ICD-10-CM | POA: Diagnosis not present

## 2014-07-28 DIAGNOSIS — R0902 Hypoxemia: Secondary | ICD-10-CM | POA: Diagnosis not present

## 2014-07-28 DIAGNOSIS — G40309 Generalized idiopathic epilepsy and epileptic syndromes, not intractable, without status epilepticus: Secondary | ICD-10-CM

## 2014-07-28 DIAGNOSIS — F329 Major depressive disorder, single episode, unspecified: Secondary | ICD-10-CM | POA: Diagnosis not present

## 2014-07-28 DIAGNOSIS — Z79899 Other long term (current) drug therapy: Secondary | ICD-10-CM

## 2014-07-28 DIAGNOSIS — R062 Wheezing: Secondary | ICD-10-CM | POA: Diagnosis present

## 2014-07-28 DIAGNOSIS — Z681 Body mass index (BMI) 19 or less, adult: Secondary | ICD-10-CM

## 2014-07-28 DIAGNOSIS — E46 Unspecified protein-calorie malnutrition: Secondary | ICD-10-CM | POA: Diagnosis present

## 2014-07-28 DIAGNOSIS — G40209 Localization-related (focal) (partial) symptomatic epilepsy and epileptic syndromes with complex partial seizures, not intractable, without status epilepticus: Secondary | ICD-10-CM

## 2014-07-28 DIAGNOSIS — G40301 Generalized idiopathic epilepsy and epileptic syndromes, not intractable, with status epilepticus: Secondary | ICD-10-CM | POA: Diagnosis present

## 2014-07-28 DIAGNOSIS — R569 Unspecified convulsions: Secondary | ICD-10-CM

## 2014-07-28 DIAGNOSIS — Z720 Tobacco use: Secondary | ICD-10-CM | POA: Diagnosis present

## 2014-07-28 DIAGNOSIS — G40901 Epilepsy, unspecified, not intractable, with status epilepticus: Secondary | ICD-10-CM

## 2014-07-28 HISTORY — DX: Unspecified convulsions: R56.9

## 2014-07-28 LAB — CBC WITH DIFFERENTIAL/PLATELET
BASOS ABS: 0 10*3/uL (ref 0.0–0.1)
Basophils Relative: 0 % (ref 0–1)
EOS ABS: 0.1 10*3/uL (ref 0.0–0.7)
EOS PCT: 0 % (ref 0–5)
HCT: 40.9 % (ref 39.0–52.0)
HEMOGLOBIN: 14 g/dL (ref 13.0–17.0)
Lymphocytes Relative: 6 % — ABNORMAL LOW (ref 12–46)
Lymphs Abs: 1.2 10*3/uL (ref 0.7–4.0)
MCH: 32.3 pg (ref 26.0–34.0)
MCHC: 34.2 g/dL (ref 30.0–36.0)
MCV: 94.2 fL (ref 78.0–100.0)
MONOS PCT: 4 % (ref 3–12)
Monocytes Absolute: 0.7 10*3/uL (ref 0.1–1.0)
NEUTROS PCT: 90 % — AB (ref 43–77)
Neutro Abs: 17.1 10*3/uL — ABNORMAL HIGH (ref 1.7–7.7)
Platelets: 198 10*3/uL (ref 150–400)
RBC: 4.34 MIL/uL (ref 4.22–5.81)
RDW: 12.8 % (ref 11.5–15.5)
WBC: 19.1 10*3/uL — ABNORMAL HIGH (ref 4.0–10.5)

## 2014-07-28 LAB — I-STAT CHEM 8, ED
BUN: 7 mg/dL (ref 6–23)
CREATININE: 0.7 mg/dL (ref 0.50–1.35)
Calcium, Ion: 1.12 mmol/L (ref 1.12–1.23)
Chloride: 94 mmol/L — ABNORMAL LOW (ref 96–112)
Glucose, Bld: 150 mg/dL — ABNORMAL HIGH (ref 70–99)
HCT: 44 % (ref 39.0–52.0)
Hemoglobin: 15 g/dL (ref 13.0–17.0)
Potassium: 3.4 mmol/L — ABNORMAL LOW (ref 3.5–5.1)
SODIUM: 135 mmol/L (ref 135–145)
TCO2: 23 mmol/L (ref 0–100)

## 2014-07-28 LAB — LACTIC ACID, PLASMA: Lactic Acid, Venous: 1 mmol/L (ref 0.5–2.0)

## 2014-07-28 LAB — TROPONIN I
Troponin I: <0.03 ng/mL (ref <0.031)
Troponin I: <0.03 ng/mL (ref <0.031)
Troponin I: <0.03 ng/mL (ref <0.031)

## 2014-07-28 LAB — MRSA PCR SCREENING: MRSA BY PCR: NEGATIVE

## 2014-07-28 LAB — GLUCOSE, CAPILLARY: Glucose-Capillary: 126 mg/dL — ABNORMAL HIGH (ref 70–99)

## 2014-07-28 LAB — RAPID URINE DRUG SCREEN, HOSP PERFORMED
Amphetamines: NOT DETECTED
BARBITURATES: NOT DETECTED
BENZODIAZEPINES: POSITIVE — AB
Cocaine: NOT DETECTED
Opiates: NOT DETECTED
Tetrahydrocannabinol: NOT DETECTED

## 2014-07-28 LAB — I-STAT TROPONIN, ED: Troponin i, poc: 0 ng/mL (ref 0.00–0.08)

## 2014-07-28 LAB — SALICYLATE LEVEL: Salicylate Lvl: <4 mg/dL (ref 2.8–20.0)

## 2014-07-28 LAB — I-STAT CG4 LACTIC ACID, ED
LACTIC ACID, VENOUS: 0.71 mmol/L (ref 0.5–2.0)
Lactic Acid, Venous: 5.3 mmol/L (ref 0.5–2.0)

## 2014-07-28 LAB — ACETAMINOPHEN LEVEL: Acetaminophen (Tylenol), Serum: <10 ug/mL — ABNORMAL LOW (ref 10–30)

## 2014-07-28 LAB — MAGNESIUM: MAGNESIUM: 2.1 mg/dL (ref 1.5–2.5)

## 2014-07-28 MED ORDER — LEVETIRACETAM 500 MG PO TABS
500.0000 mg | ORAL_TABLET | Freq: Two times a day (BID) | ORAL | Status: DC
  Administered 2014-07-28 – 2014-07-29 (×3): 500 mg via ORAL
  Filled 2014-07-28 (×3): qty 1

## 2014-07-28 MED ORDER — ENSURE COMPLETE PO LIQD
237.0000 mL | Freq: Three times a day (TID) | ORAL | Status: DC
  Administered 2014-07-28 – 2014-07-29 (×2): 237 mL via ORAL

## 2014-07-28 MED ORDER — ADULT MULTIVITAMIN W/MINERALS CH
1.0000 | ORAL_TABLET | Freq: Every day | ORAL | Status: DC
Start: 2014-07-28 — End: 2014-07-29
  Administered 2014-07-29: 1 via ORAL
  Filled 2014-07-28: qty 1

## 2014-07-28 MED ORDER — OXYCODONE-ACETAMINOPHEN 5-325 MG PO TABS
1.0000 | ORAL_TABLET | Freq: Four times a day (QID) | ORAL | Status: DC | PRN
  Administered 2014-07-28 – 2014-07-29 (×3): 1 via ORAL
  Filled 2014-07-28 (×4): qty 1

## 2014-07-28 MED ORDER — DIAZEPAM 5 MG PO TABS
5.0000 mg | ORAL_TABLET | Freq: Two times a day (BID) | ORAL | Status: DC
  Administered 2014-07-28 – 2014-07-29 (×2): 5 mg via ORAL
  Filled 2014-07-28 (×2): qty 1

## 2014-07-28 MED ORDER — ASPIRIN 81 MG PO CHEW
324.0000 mg | CHEWABLE_TABLET | Freq: Once | ORAL | Status: AC
  Administered 2014-07-28: 324 mg via ORAL
  Filled 2014-07-28: qty 4

## 2014-07-28 MED ORDER — LORAZEPAM 2 MG/ML IJ SOLN: 1.0000 mg | Freq: Four times a day (QID) | INTRAMUSCULAR | Status: DC | PRN

## 2014-07-28 MED ORDER — ONDANSETRON HCL 4 MG/2ML IJ SOLN: 4.0000 mg | Freq: Four times a day (QID) | INTRAMUSCULAR | Status: DC | PRN

## 2014-07-28 MED ORDER — SODIUM CHLORIDE 0.9 % IV BOLUS (SEPSIS)
500.0000 mL | Freq: Once | INTRAVENOUS | Status: AC
  Administered 2014-07-28: 500 mL via INTRAVENOUS

## 2014-07-28 MED ORDER — OXYCODONE HCL 5 MG PO TABS
5.0000 mg | ORAL_TABLET | Freq: Four times a day (QID) | ORAL | Status: DC | PRN
  Administered 2014-07-28 – 2014-07-29 (×3): 5 mg via ORAL
  Filled 2014-07-28 (×4): qty 1

## 2014-07-28 MED ORDER — SODIUM CHLORIDE 0.9 % IV BOLUS (SEPSIS)
1000.0000 mL | Freq: Once | INTRAVENOUS | Status: AC
  Administered 2014-07-28: 1000 mL via INTRAVENOUS

## 2014-07-28 MED ORDER — ALBUTEROL SULFATE (2.5 MG/3ML) 0.083% IN NEBU
5.0000 mg | INHALATION_SOLUTION | Freq: Once | RESPIRATORY_TRACT | Status: AC
  Administered 2014-07-28: 5 mg via RESPIRATORY_TRACT
  Filled 2014-07-28: qty 6

## 2014-07-28 MED ORDER — NICOTINE 14 MG/24HR TD PT24
14.0000 mg | MEDICATED_PATCH | Freq: Every day | TRANSDERMAL | Status: DC
  Administered 2014-07-28 – 2014-07-29 (×2): 14 mg via TRANSDERMAL
  Filled 2014-07-28 (×2): qty 1

## 2014-07-28 MED ORDER — ASPIRIN 325 MG PO TABS
325.0000 mg | ORAL_TABLET | Freq: Every day | ORAL | Status: DC
  Administered 2014-07-29: 325 mg via ORAL

## 2014-07-28 MED ORDER — KETOROLAC TROMETHAMINE 30 MG/ML IJ SOLN
30.0000 mg | Freq: Once | INTRAMUSCULAR | Status: AC
  Administered 2014-07-28: 30 mg via INTRAVENOUS
  Filled 2014-07-28: qty 1

## 2014-07-28 MED ORDER — POTASSIUM CHLORIDE IN NACL 40-0.9 MEQ/L-% IV SOLN
INTRAVENOUS | Status: DC
  Administered 2014-07-28: 125 mL/h via INTRAVENOUS
  Administered 2014-07-28: 75 mL/h via INTRAVENOUS
  Administered 2014-07-29: 125 mL/h via INTRAVENOUS
  Filled 2014-07-28 (×5): qty 1000

## 2014-07-28 MED ORDER — ACETAMINOPHEN 650 MG RE SUPP: 650.0000 mg | Freq: Four times a day (QID) | RECTAL | Status: DC | PRN

## 2014-07-28 MED ORDER — ALBUTEROL SULFATE (2.5 MG/3ML) 0.083% IN NEBU: 2.5000 mg | INHALATION_SOLUTION | RESPIRATORY_TRACT | Status: DC | PRN

## 2014-07-28 MED ORDER — OXYCODONE-ACETAMINOPHEN 10-325 MG PO TABS: 1.0000 | ORAL_TABLET | Freq: Four times a day (QID) | ORAL | Status: DC | PRN

## 2014-07-28 MED ORDER — FOLIC ACID 1 MG PO TABS
1.0000 mg | ORAL_TABLET | Freq: Every day | ORAL | Status: DC
  Administered 2014-07-29: 1 mg via ORAL
  Filled 2014-07-28: qty 1

## 2014-07-28 MED ORDER — VITAMIN B-1 100 MG PO TABS
100.0000 mg | ORAL_TABLET | Freq: Every day | ORAL | Status: DC
  Administered 2014-07-29: 100 mg via ORAL
  Filled 2014-07-28: qty 1

## 2014-07-28 MED ORDER — CITALOPRAM HYDROBROMIDE 20 MG PO TABS: 20.0000 mg | ORAL_TABLET | Freq: Every day | ORAL | Status: DC

## 2014-07-28 MED ORDER — ACETAMINOPHEN 325 MG PO TABS: 650.0000 mg | ORAL_TABLET | Freq: Four times a day (QID) | ORAL | Status: DC | PRN

## 2014-07-28 MED ORDER — SODIUM CHLORIDE 0.9 % IJ SOLN
3.0000 mL | Freq: Two times a day (BID) | INTRAMUSCULAR | Status: DC
Start: 2014-07-28 — End: 2014-07-29
  Administered 2014-07-28: 3 mL via INTRAVENOUS

## 2014-07-28 MED ORDER — LEVETIRACETAM IN NACL 1000 MG/100ML IV SOLN
1000.0000 mg | Freq: Once | INTRAVENOUS | Status: AC
  Administered 2014-07-28: 1000 mg via INTRAVENOUS
  Filled 2014-07-28: qty 100

## 2014-07-28 MED ORDER — ENOXAPARIN SODIUM 40 MG/0.4ML ~~LOC~~ SOLN
40.0000 mg | SUBCUTANEOUS | Status: DC
  Administered 2014-07-28: 40 mg via SUBCUTANEOUS
  Filled 2014-07-28 (×2): qty 0.4

## 2014-07-28 MED ORDER — CITALOPRAM HYDROBROMIDE 20 MG PO TABS
20.0000 mg | ORAL_TABLET | Freq: Every day | ORAL | Status: DC
  Administered 2014-07-28: 20 mg via ORAL
  Filled 2014-07-28: qty 1

## 2014-07-28 MED ORDER — PNEUMOCOCCAL VAC POLYVALENT 25 MCG/0.5ML IJ INJ
0.5000 mL | INJECTION | INTRAMUSCULAR | Status: AC
  Administered 2014-07-29: 0.5 mL via INTRAMUSCULAR
  Filled 2014-07-28: qty 0.5

## 2014-07-28 MED ORDER — ONDANSETRON HCL 4 MG PO TABS: 4.0000 mg | ORAL_TABLET | Freq: Four times a day (QID) | ORAL | Status: DC | PRN

## 2014-07-28 NOTE — ED Notes (Addendum)
Pt to ED from home via [facility] c/o seizures x 4 at home. Son at bedside, reports pt was "unresponsive, no pulse, not breathing nothing. I did cpr for about 5 minutes." EMS reports being called to home four times tonight in which pt was having a witness seizure. Pt alert and oriented x 4 on arrival to ED. Pt also c/o chest pain radiating to between shoulder blades. Reports taking 324mg  ASA at home. Son also expressing concerns that pt has been taking diazepam more than prescribed

## 2014-07-28 NOTE — Progress Notes (Signed)
EEG completed; results pending.    

## 2014-07-28 NOTE — ED Provider Notes (Signed)
CSN: [id number]     Arrival date & time 07/28/14  0421 History   First MD Initiated Contact with Patient 07/28/14 0451     Chief Complaint  Patient presents with  . Seizures  . Chest Pain     (Consider location/radiation/quality/duration/timing/severity/associated sxs/prior Treatment) Patient is a 59 y.o. male presenting with seizures and chest pain. The history is provided by the patient and a relative.  Seizures Seizure activity on arrival: no   Seizure type:  Grand mal Preceding symptoms: no sensation of an aura present   Initial focality:  None Episode characteristics: generalized shaking and incontinence   Postictal symptoms: no memory loss   Return to baseline: yes   Severity:  Severe Timing:  Clustered Progression:  Resolved Context: not intracranial lesion   Recent head injury:  No recent head injuries PTA treatment:  None History of seizures: yes   Severity:  Severe Seizure control level:  Well controlled Home seizure meds: valium. Compliance with current therapy:  Unable to specify Chest Pain Pain location:  Substernal area Pain quality: sharp   Pain radiates to:  Does not radiate Pain severity:  Severe Onset quality:  Sudden Context: not lifting   Relieved by:  Nothing Worsened by:  Nothing tried Associated symptoms: no abdominal pain, no fever, no lower extremity edema and no palpitations     History reviewed. No pertinent past medical history. Past Surgical History  Procedure Laterality Date  . Cervical spine surgery     Family History  Problem Relation Age of Onset  . Cancer Mother     lymphoma  . Cancer Brother     lung    History  Substance Use Topics  . Smoking status: Current Every Day Smoker -- 1.00 packs/day for 26 years    Types: Cigarettes  . Smokeless tobacco: Not on file     Comment: patient is aware he needs to stop   . Alcohol Use: No    Review of Systems  Constitutional: Negative for fever.  Respiratory: Positive for  wheezing.   Cardiovascular: Positive for chest pain. Negative for palpitations and leg swelling.  Gastrointestinal: Negative for abdominal pain.  Neurological: Positive for seizures.  All other systems reviewed and are negative.     Allergies  Antihistamines, diphenhydramine-type and Codeine  Home Medications   Prior to Admission medications   Medication Sig Start Date End Date Taking? Authorizing Provider  citalopram (CELEXA) 20 MG tablet Take 1 tablet (20 mg total) by mouth daily. 12/28/13  Yes Kristen N Ward, DO  diazepam (VALIUM) 5 MG tablet Take 5 mg by mouth 2 (two) times daily.   Yes Historical Provider, MD  oxyCODONE-acetaminophen (PERCOCET) 10-325 MG per tablet Take 1 tablet by mouth every 4 (four) hours as needed for pain. Neck and back pain   Yes Historical Provider, MD  aspirin 325 MG tablet Take 325 mg by mouth daily. For pain    Historical Provider, MD  naproxen sodium (ANAPROX) 220 MG tablet Take 440 mg by mouth daily as needed (for pain).    Historical Provider, MD   BP 111/71 mmHg  Pulse 80  Temp(Src) 98.1 F (36.7 C) (Oral)  Resp 16  SpO2 96% Physical Exam  Constitutional: He is oriented to person, place, and time. He appears well-developed and well-nourished. No distress.  HENT:  Head: Normocephalic and atraumatic.  Mouth/Throat: Oropharynx is clear and moist.  Eyes: Conjunctivae are normal. Pupils are equal, round, and reactive to light.  Neck: Normal range of  motion. Neck supple.  Cardiovascular: Normal rate and regular rhythm.   Pulmonary/Chest: No stridor. He has wheezes. He has no rales.  Abdominal: Soft. Bowel sounds are normal. There is no tenderness. There is no rebound and no guarding.  Musculoskeletal: Normal range of motion. He exhibits no edema.  Neurological: He is alert and oriented to person, place, and time.  Skin: Skin is warm and dry.  Psychiatric: He has a normal mood and affect.    ED Course  Procedures (including critical care  time) Labs Review Labs Reviewed  CBC WITH DIFFERENTIAL/PLATELET - Abnormal; Notable for the following:    WBC 19.1 (*)    Neutrophils Relative % 90 (*)    Neutro Abs 17.1 (*)    Lymphocytes Relative 6 (*)    All other components within normal limits  I-STAT CHEM 8, ED - Abnormal; Notable for the following:    Potassium 3.4 (*)    Chloride 94 (*)    Glucose, Bld 150 (*)    All other components within normal limits  I-STAT CG4 LACTIC ACID, ED - Abnormal; Notable for the following:    Lactic Acid, Venous 5.30 (*)    All other components within normal limits  URINE RAPID DRUG SCREEN (HOSP PERFORMED)  ACETAMINOPHEN LEVEL  SALICYLATE LEVEL  LACTIC ACID, PLASMA  I-STAT CG4 LACTIC ACID, ED  CBG MONITORING, ED  Rosezena Sensor, ED    Imaging Review Dg Chest 2 View  07/28/2014   CLINICAL DATA:  Seizures, unable to communicate. History of cognitive impairment.  EXAM: CHEST  2 VIEW  COMPARISON:  None.  FINDINGS: Cardiac silhouette is unremarkable, tortuous mildly calcified aorta. Increased lung volumes with mild chronic interstitial changes. No pleural effusion or focal consolidation. No pneumothorax.  Osteopenia. Moderate degenerative change of thoracic spine. Moderate mid thoracic compression fracture. Partially imaged ACDF appear  IMPRESSION: No acute cardiopulmonary process.  Moderate mid thoracic compression fracture, age indeterminate. Recommend correlation point tenderness.   Electronically Signed   By: Awilda Metro   On: 07/28/2014 05:33   Ct Head Wo Contrast  07/28/2014   CLINICAL DATA:  Four seizures at home, found unresponsive, CPR performed.  EXAM: CT HEAD WITHOUT CONTRAST  TECHNIQUE: Contiguous axial images were obtained from the base of the skull through the vertex without intravenous contrast.  COMPARISON:  CT of the head April 30, 2014  FINDINGS: No acute large vascular territory infarcts. Ventricles are sulci are normal for patient's age. Minimal white matter changes can  be seen with chronic small vessel ischemic disease. No intraparenchymal hemorrhage, mass effect or midline shift.  No abnormal extra-axial fluid collections. Basal cisterns are patent. Mild calcific atherosclerosis of the carotid siphons.  No skull fracture. The included ocular globes and orbital contents are non-suspicious. Status post bilateral ocular lens implants. Trace LEFT mastoid effusion. The paranasal sinuses are well aerated. Patient is edentulous.  IMPRESSION: No acute intracranial process ; stable appearance the head from April 30, 2014.   Electronically Signed   By: Awilda Metro   On: 07/28/2014 05:22     EKG Interpretation   Date/Time:  Tuesday July 28 2014 04:38:17 EST Ventricular Rate:  81 PR Interval:  174 QRS Duration: 102 QT Interval:  394 QTC Calculation: 457 R Axis:   -91 Text Interpretation:  Sinus rhythm Baseline wander in lead(s) III aVF V6  Confirmed by [facility]  MD, Jozlynn Plaia ([id number]) on 07/28/2014 4:47:25 AM      MDM   Final diagnoses:  Seizure  Medications  sodium chloride 0.9 % bolus 1,000 mL (1,000 mLs Intravenous New Bag/Given 07/28/14 0530)  levETIRAcetam (KEPPRA) IVPB 1000 mg/100 mL premix (1,000 mg Intravenous Given 07/28/14 9604)  aspirin chewable tablet 324 mg (324 mg Oral Given 07/28/14 0604)  ketorolac (TORADOL) 30 MG/ML injection 30 mg (30 mg Intravenous Given 07/28/14 0604)  albuterol (PROVENTIL) (2.5 MG/3ML) 0.083% nebulizer solution 5 mg (5 mg Nebulization Given 07/28/14 0711)    Seen by Dr. Amada Jupiter of neurology.  Keppra and admit to medicine  Plan admit  Case d/w Dr. Julian Reil admit to inpatient, tele    Jezabel Lecker Smitty Cords, MD 07/28/14 [phone]

## 2014-07-28 NOTE — ED Notes (Signed)
Dr. Palumbo at bedside. 

## 2014-07-28 NOTE — Care Management Note (Signed)
    Page 1 of 1   07/29/2014     12:12:01 PM CARE MANAGEMENT NOTE 07/29/2014  Patient:  Ronald MarcusGOINS,Ronald [name]   Account Number:  [id number]  Date Initiated:  07/28/2014  Documentation initiated by:  COLE,ANGELA  Subjective/Objective Assessment:   PTA  pt from home admitted with seizures and CP     Action/Plan:   Return to home once stable. Will continue to monitor for disposition needs.   Anticipated DC Date:  07/29/2014   Anticipated DC Plan:        DC Planning Services  CM consult      [facility]   Choice offered to / List presented to:  C-1 Patient        HH arranged  HH-2 PT      HH agency  [facility]   Status of service:  Completed, signed off Medicare Important Message given?   (If response is "NO", the following Medicare IM given date fields will be blank) Date Medicare IM given:   Medicare IM given by:   Date Additional Medicare IM given:   Additional Medicare IM given by:    Discharge Disposition:  HOME W HOME HEALTH SERVICES  Per UR Regulation:  Reviewed for med. necessity/level of care/duration of stay  If discussed at Long Length of Stay Meetings, dates discussed:    Comments:  07/29/2014 @ 11:30 Gae GallopAngela Cole RN,BSN Spoke with pt about HHPT. Pt agreed with recommendations [facility] Pt states son lives with him and son works 8 hrs of the day. While son is @ work pt states family and friends are avaliable to check in on him. [facility] Lupita LeashDonna @ [phone] for HHPT. Contact # for son [name](Sam) [phone]. SOC to begin within 24-48 hrs of d/c.Awaitng MD  orders for HHPT  and F2F.

## 2014-07-28 NOTE — Progress Notes (Signed)
INITIAL NUTRITION ASSESSMENT  DOCUMENTATION CODES Per approved criteria  -Severe  malnutrition in the context of social or environmental circumstances   INTERVENTION:  Ensure Complete PO TID, each supplement provides 350 kcal and 13 grams of protein  NUTRITION DIAGNOSIS: Malnutrition related to inadequate oral intake as evidenced by severe depletion of muscle and subcutaneous fat mass.   Goal: Intake to meet >90% of estimated nutrition needs.  Monitor:  PO intake, labs, weight trend.  Reason for Assessment: MD Consult  59 y.o. male  Admitting Dx: Epileptic seizures  ASSESSMENT: Patient admitted on 2/16 after having 4 seizures that night. During one of the seizures, patient did not have a pulse and son had to briefly perform CPR on him.  Patient reports that he has been eating poorly for the past few months due to depression and worrying. He says he is going to skip eating lunch today. Discussed with patient the importance of eating 3 meals and drinking supplements between meals to prevent further weight loss. He was surprised when he found out that he only weighs 119.5 lbs.  Nutrition Focused Physical Exam:  Subcutaneous Fat:  Orbital Region: moderate depletion Upper Arm Region: severe depletion Thoracic and Lumbar Region: NA  Muscle:  Temple Region: severe depletion Clavicle Bone Region: severe depletion Clavicle and Acromion Bone Region: severe depletion Scapular Bone Region: NA Dorsal Hand: severe depletion Patellar Region: severe depletion Anterior Thigh Region: severe depletion Posterior Calf Region: severe depletion  Edema: none  Height: Ht Readings from Last 1 Encounters:  07/28/14 6' (1.829 m)    Weight: Wt Readings from Last 1 Encounters:  07/28/14 119 lb 8 oz (54.205 kg)    Ideal Body Weight: 80.9 kg  % Ideal Body Weight: 67%  Wt Readings from Last 10 Encounters:  07/28/14 119 lb 8 oz (54.205 kg)  01/08/14 130 lb 6.4 oz (59.149 kg)  12/28/13  132 lb (59.875 kg)  09/24/13 138 lb 14.4 oz (63.005 kg)    Usual Body Weight: 130 lbs  % Usual Body Weight: 92%  BMI:  Body mass index is 16.2 kg/(m^2). Underweight  Estimated Nutritional Needs: Kcal: 1900-2100 Protein: 90-100 gm Fluid: 2 L  Skin: no issues  Diet Order: Diet regular  EDUCATION NEEDS: -Education needs addressed   Intake/Output Summary (Last 24 hours) at 07/28/14 1356 Last data filed at 07/28/14 1342  Gross per 24 hour  Intake      0 ml  Output    360 ml  Net   -360 ml    Last BM: 2/13   Labs:   Recent Labs Lab 07/28/14 0451 07/28/14 0805  NA 135  --   K 3.4*  --   CL 94*  --   BUN 7  --   CREATININE 0.70  --   MG  --  2.1  GLUCOSE 150*  --     CBG (last 3)   Recent Labs  07/28/14 1005  GLUCAP 126*    Scheduled Meds: . [START ON 07/29/2014] aspirin  325 mg Oral Daily  . citalopram  20 mg Oral Daily  . diazepam  5 mg Oral BID  . enoxaparin (LOVENOX) injection  40 mg Subcutaneous Q24H  . feeding supplement (ENSURE COMPLETE)  237 mL Oral TID BM  . folic acid  1 mg Oral Daily  . levETIRAcetam  500 mg Oral BID  . multivitamin with minerals  1 tablet Oral Daily  . nicotine  14 mg Transdermal Daily  . [START ON 07/29/2014] pneumococcal  23 valent vaccine  0.5 mL Intramuscular Tomorrow-1000  . sodium chloride  3 mL Intravenous Q12H  . thiamine  100 mg Oral Daily    Continuous Infusions: . 0.9 % NaCl with KCl 40 mEq / L 125 mL/hr (07/28/14 1045)    Past Medical History  Diagnosis Date  . Seizures   . Bipolar disorder     Past Surgical History  Procedure Laterality Date  . Cervical spine surgery      Joaquin CourtsKimberly Harris, RD, LDN, CNSC Pager [phone] After Hours Pager [phone]

## 2014-07-28 NOTE — Procedures (Signed)
EEG report.  Brief clinical history: 59 y.o. male with a history of a single other episode of seizure who presents with multiple seizures over the night. He had a total of four, including one following which the son reports that he "did not have a pulse" and he performed CPR. He currently is back to baseline.   Technique: this is a 17 channel routine scalp EEG performed at the bedside with bipolar and monopolar montages arranged in accordance to the international 10/20 system of electrode placement. One channel was dedicated to EKG recording.  The study was performed during wakefulness, drowsiness, and stage 2 sleep. No activating procedures performed.  Description:In the wakeful state, the best background consisted of a medium amplitude, posterior dominant, well sustained, symmetric and reactive 10 Hz rhythm. Beta activity was noted over the anterior head regions. Drowsiness demonstrated dropout of the alpha rhythm. Stage 2 sleep showed symmetric and synchronous sleep spindles without intermixed epileptiform discharges. No focal or generalized epileptiform discharges noted.  No pathologic areas of slowing seen.  EKG showed sinus rhythm.  Impression: this is a normal awake and asleep EEG. Please, be aware that a normal EEG does not exclude the possibility of epilepsy.  Clinical correlation is advised.   Wyatt Portelasvaldo Camilo, MD

## 2014-07-28 NOTE — Consult Note (Signed)
Neurology Consultation Reason for Consult: Seizure Referring Physician: Palumbo-Rasch, Ronald [name]  CC: Seizures  History is obtained from:Patient, Ronald [name]  HPI: Ronald MarcusSamuel E Arshad Sr. is Ronald [name] 59 y.o. male with Ronald [name] history of Ronald [name] single other episode of seizure who presents with multiple seizures over the night. He had Ronald [name] total of four, including one following which the Ronald [name] reports that he "did not have Ronald [name] pulse" and he performed CPR. He currently is back to baseline.   His Ronald [name] describes him getting Ronald [name] blank stare before the seizures, and then his right arm will flex and left arm extend.   His previous seizure was in the setting of Ronald [name] reduction in benzodiazepine dose and starting bupropion and it was attributed to these factors.   He is being evaluated by Dr. Everlena CooperJaffe for the seizures and dementia, but has not made it to his appointments due to lack of ride.   He has been on the valium for seizures.   His Ronald [name] reports Ronald [name] widely varying dosing regimen for the valium, and he is concerned for overuse at times.   ROS: Ronald [name] 14 point ROS was performed and is negative except as noted in the HPI.   PMH: Previous Seizure  Family History: No hx seizures.   Social History: Tob: current smoker  Exam: Current vital signs: BP 111/71 mmHg  Pulse 80  Temp(Src) 98.1 F (36.7 C) (Oral)  Resp 16  SpO2 96% Vital signs in last 24 hours: Temp:  [98.1 F (36.7 C)] 98.1 F (36.7 C) (02/16 0436) Pulse Rate:  [80-84] 80 (02/16 0530) Resp:  [16] 16 (02/16 0530) BP: (111-116)/(71-82) 111/71 mmHg (02/16 0530) SpO2:  [85 %-97 %] 96 % (02/16 0530)   Physical Exam  Constitutional: Appears thin and mildly dissheveled Psych: Affect appropriate to situation Eyes: No scleral injection HENT: No OP obstrucion Head: Normocephalic.  Cardiovascular: Normal rate and regular rhythm.  Respiratory: Effort normal  GI: Soft.  No distension. There is no tenderness.  Skin: WDI  Neuro: Mental Status: Patient is awake, alert, oriented to  person, place, month, year, and situation. Patient is able to give Ronald [name] clear and coherent history. No signs of aphasia or neglect Cranial Nerves: II: Visual Fields are full. Pupils are equal, round, and reactive to light.   III,IV, VI: EOMI without ptosis or diploplia.  V: Facial sensation is symmetric to temperature VII: Facial movement is symmetric.  VIII: hearing is intact to voice X: Uvula elevates symmetrically XI: Shoulder shrug is symmetric. XII: tongue is midline without atrophy or fasciculations.  Motor: Tone is normal. Bulk is normal. 5/5 strength was present in all four extremities though limited in his upper extremities due to pain.  Sensory: Sensation is symmetric to light touch in the arms and legs. Plantars: Toes are downgoing bilaterally.  Cerebellar: FNF with mild tremor bilaterally     I have reviewed labs in epic and the results pertinent to this consultation are: leukocytosis  I have reviewed the images obtained:CT head - no acute findings.   Impression: 59 yo M with seizures in the setting of likely benzodiazepine withdrawal. He is being loaded with keppra due to the repeated episodes and the fact that there is Ronald [name] possible localizing sign(figure-of-four posturing). He has had an MRI since these started and CT is negative, but Ronald [name] repeat EEG could be helpful.   Recommendations: 1) EEG 2) Keppra 500mg  BID for now.  3) Patient will need slow wean from valium   Ritta SlotMcNeill Carmichael Burdette, MD [facility] [phone]  If 7pm- 7am, please page neurology on call as listed in [place].

## 2014-07-28 NOTE — ED Notes (Signed)
Keppra completed.

## 2014-07-28 NOTE — ED Notes (Signed)
Admitting MD at bedside. Report given to floor.

## 2014-07-28 NOTE — ED Notes (Signed)
Dr. Kirkpatrick at bedside 

## 2014-07-28 NOTE — Progress Notes (Signed)
UR Completed Ardean Simonich Graves-Bigelow, RN,BSN [phone]  

## 2014-07-28 NOTE — Evaluation (Signed)
Physical Therapy Evaluation Patient Details Name: Ronald [name] Sr. MRN: [id number] DOB: 05-Mar-1956 Today's Date: 07/28/2014   History of Present Illness  Pt is a 59 y/o male admitted after experiencing 4 episodes of seizure.  Work up continues.  Clinical Impression  Pt admitted with/for seizure.  Pt currently limited functionally due to the problems listed below.  (see problems list.)  Pt will benefit from PT to maximize function and safety to be able to get home safely with available assist.     Follow Up Recommendations Home health PT;Supervision - Intermittent    Equipment Recommendations  None recommended by PT    Recommendations for Other Services       Precautions / Restrictions Precautions Precautions: Fall      Mobility  Bed Mobility Overal bed mobility: Needs Assistance Bed Mobility: Supine to Sit     Supine to sit: Supervision        Transfers Overall transfer level: Needs assistance Equipment used: Straight cane Transfers: Sit to/from Stand Sit to Stand: Supervision         General transfer comment: generally safe transfer technque  Ambulation/Gait Ambulation/Gait assistance: Min guard Ambulation Distance (Feet): 40 Feet Assistive device: Straight cane Gait Pattern/deviations: Step-through pattern Gait velocity: slower   General Gait Details: slow, mildly unsteady steps with flexed posture due to chronically painful back.  Stairs            Wheelchair Mobility    Modified Rankin (Stroke Patients Only)       Balance Overall balance assessment: Needs assistance Sitting-balance support: No upper extremity supported Sitting balance-Leahy Scale: Fair     Standing balance support: No upper extremity supported;Single extremity supported Standing balance-Leahy Scale: Fair                               Pertinent Vitals/Pain Pain Assessment: Faces Faces Pain Scale: Hurts whole lot Pain Location: back Pain Descriptors  / Indicators: Constant;Guarding;Aching Pain Intervention(s): Limited activity within patient's tolerance;Patient requesting pain meds-RN notified    Home Living Family/patient expects to be discharged to:: Private residence Living Arrangements: Children Available Help at Discharge: Family;Available PRN/intermittently;Other (Comment) (pt is alone quite a bit.) Type of Home: House Home Access: Stairs to enter Entrance Stairs-Rails: Doctor, general practice of Steps: 5 Home Layout: One level Home Equipment: Cane - single point      Prior Function Level of Independence: Independent with assistive device(s)               Hand Dominance        Extremity/Trunk Assessment               Lower Extremity Assessment: Generalized weakness;Overall WFL for tasks assessed (and MMT causes back pain)         Communication   Communication: No difficulties  Cognition Arousal/Alertness: Awake/alert Behavior During Therapy: WFL for tasks assessed/performed Overall Cognitive Status: Within Functional Limits for tasks assessed                      General Comments General comments (skin integrity, edema, etc.): BP has been low today, but after ambulation it was 117/80.  SpO2 int the mid 90's on RA    Exercises        Assessment/Plan    PT Assessment Patient needs continued PT services  PT Diagnosis Generalized weakness   PT Problem List Decreased strength;Decreased activity tolerance;Decreased balance;Decreased mobility;Pain  PT  Treatment Interventions Gait training;Stair training;Functional mobility training;Therapeutic activities;Balance training;Patient/family education   PT Goals (Current goals can be found in the Care Plan section) Acute Rehab PT Goals Patient Stated Goal: back home PT Goal Formulation: With patient Time For Goal Achievement: 08/04/14 Potential to Achieve Goals: Good    Frequency Min 3X/week   Barriers to discharge Decreased  caregiver support      Co-evaluation               End of Session   Activity Tolerance: Patient tolerated treatment well Patient left: Other (comment);with family/visitor present;with call bell/phone within reach (sitting EOB) Nurse Communication: Mobility status         Time: [id number] PT Time Calculation (min) (ACUTE ONLY): 24 min   Charges:   PT Evaluation $Initial PT Evaluation Tier I: 1 Procedure PT Treatments $Gait Training: 8-22 mins   PT G Codes:        Timathy Newberry, Eliseo GumKenneth V 07/28/2014, 4:50 PM  07/28/2014  [place], PT [phone]  (pager)

## 2014-07-28 NOTE — H&P (Signed)
[facility] History and Physical  Ronald [name] Sr. ZOX:[id number] DOB: 1956/03/01 DOA: 07/28/2014  Referring physician:ED PCP: Aida Puffer, MD   Chief Complaint:  seizures  History open from ED, neurology notes. Patient is a poor historian and his son was not reachable during the time of Exam.   HPI:  59 year old male with history of chronic back and neck pain who follows with neurologist Dr. Everlena Cooper for cognitive impairment and essential tremor (on chronic Valium), who had an episode of generalized tonic-clonic seizure in July 2015. He had an MRI of his brain and EEG during that time which were unremarkable. Patient was not started on any antiepileptics. He did not have any further seizure activity until last night when patient was found to have a total of 4 seizure activities when his son noticed patient to have a blank stare prior to the seizures and then his right arm flexed and the left arm extended. His last seizure was thought to be due to reduction of the benzodiazepine dose and starting bupropion. His son also reported that during one of the seizures last night , patient did not have a pulse and he had to briefly perform CPR on him. The ED physician's note reports that patient also was incontinent of urine. His son reported that patient has varying  dose of Valium as outpatient and he was concerned about overuse as well. Patient also reported having some chest pain to the ED physician.  In the ED patient's vitals were stable except for briefly being hypoxic to 85% on room air. Patient was also found to be wheezy on exam. Drug work done showed WBC of 19.1, normal hemoglobin and platelets. MSG show sodium of 135, potassium of 3.4, chloride of 94, BUN of 7 and creatinine of 0.7, glucose of 150. Chest x-ray was unremarkable. Head CT was negative for acute findings. EKG showed normal sinus rhythm without ST-T changes. Initial troponin was negative. Patient had elevated venous lactate,  with subsequent level being normal. Tylenol and salicylate levels were normal as well. Patient was given a loading dose of IV Keppra 1000 mg in the ED and neuro hospitalist was consulted. He was also given a dose of albuterol nebulizer. Hospitalist admission requested.  Patient denies headache, dizziness, fever, chills, nausea , vomiting,  palpitations, SOB, abdominal pain, bowel  symptoms. Denies recent illness or missing out on his medications. Reports chronic neck and back pain.  Review of Systems:  As outlined in history of present illness. Review of systems Limited due to patient being poor historian and possible post ictal state.   History reviewed. No pertinent past medical history. Past Surgical History  Procedure Laterality Date  . Cervical spine surgery     Social History:  reports that he has been smoking Cigarettes.  He has a 26 pack-year smoking history. He does not have any smokeless tobacco history on file. He reports that he does not drink alcohol or use illicit drugs.  Allergies  Allergen Reactions  . Antihistamines, Diphenhydramine-Type Other (See Comments)    Speeds up heart rate.  . Codeine Nausea And Vomiting    Family History  Problem Relation Age of Onset  . Cancer Mother     lymphoma  . Cancer Brother     lung     Prior to Admission medications   Medication Sig Start Date End Date Taking? Authorizing Provider  citalopram (CELEXA) 20 MG tablet Take 1 tablet (20 mg total) by mouth daily. 12/28/13  Yes Layla Maw Ward,  DO  diazepam (VALIUM) 5 MG tablet Take 5 mg by mouth 2 (two) times daily.   Yes Historical Provider, MD  oxyCODONE-acetaminophen (PERCOCET) 10-325 MG per tablet Take 1 tablet by mouth every 4 (four) hours as needed for pain. Neck and back pain   Yes Historical Provider, MD  aspirin 325 MG tablet Take 325 mg by mouth daily. For pain    Historical Provider, MD  naproxen sodium (ANAPROX) 220 MG tablet Take 440 mg by mouth daily as needed (for  pain).    Historical Provider, MD     Physical Exam:  Filed Vitals:   07/28/14 0600 07/28/14 0630 07/28/14 0700 07/28/14 0723  BP: 121/76 119/80 108/77 108/77  Pulse: 79 75 71 74  Temp:      TempSrc:      Resp: 19 16 17 20   SpO2:    100%    Constitutional: Vital signs reviewed.  Patient is a cachectic male lying in bed in no acute distress. Appeared drowsy  HEENT: dilated pupils, no pallor, no icterus, moist oral mucosa, no cervical lymphadenopathy, supple neck Cardiovascular: RRR, S1 normal, S2 normal, no MRG Chest: CTAB, scattered rhonchi, no crackles Abdominal: Soft. Non-tender, non-distended, bowel sounds are normal, no masses, organomegaly, or guarding present.  Ext: warm, no edema Neurological: Drowsy but easily arousable and oriented answering a few questions, dilated pupil, cranial nerves intact, resting tremors, bilateral strength and sensory function intact.  Labs on Admission:  Basic Metabolic Panel:  Recent Labs Lab 07/28/14 0451  NA 135  K 3.4*  CL 94*  GLUCOSE 150*  BUN 7  CREATININE 0.70   Liver Function Tests: No results for input(s): AST, ALT, ALKPHOS, BILITOT, PROT, ALBUMIN in the last 168 hours. No results for input(s): LIPASE, AMYLASE in the last 168 hours. No results for input(s): AMMONIA in the last 168 hours. CBC:  Recent Labs Lab 07/28/14 0440 07/28/14 0451  WBC 19.1*  --   NEUTROABS 17.1*  --   HGB 14.0 15.0  HCT 40.9 44.0  MCV 94.2  --   PLT 198  --    Cardiac Enzymes: No results for input(s): CKTOTAL, CKMB, CKMBINDEX, TROPONINI in the last 168 hours. BNP: Invalid input(s): POCBNP CBG: No results for input(s): GLUCAP in the last 168 hours.  Radiological Exams on Admission: Dg Chest 2 View  07/28/2014   CLINICAL DATA:  Seizures, unable to communicate. History of cognitive impairment.  EXAM: CHEST  2 VIEW  COMPARISON:  None.  FINDINGS: Cardiac silhouette is unremarkable, tortuous mildly calcified aorta. Increased lung volumes with  mild chronic interstitial changes. No pleural effusion or focal consolidation. No pneumothorax.  Osteopenia. Moderate degenerative change of thoracic spine. Moderate mid thoracic compression fracture. Partially imaged ACDF appear  IMPRESSION: No acute cardiopulmonary process.  Moderate mid thoracic compression fracture, age indeterminate. Recommend correlation point tenderness.   Electronically Signed   By: Awilda Metroourtnay  Bloomer   On: 07/28/2014 05:33   Ct Head Wo Contrast  07/28/2014   CLINICAL DATA:  Four seizures at home, found unresponsive, CPR performed.  EXAM: CT HEAD WITHOUT CONTRAST  TECHNIQUE: Contiguous axial images were obtained from the base of the skull through the vertex without intravenous contrast.  COMPARISON:  CT of the head April 30, 2014  FINDINGS: No acute large vascular territory infarcts. Ventricles are sulci are normal for patient's age. Minimal white matter changes can be seen with chronic small vessel ischemic disease. No intraparenchymal hemorrhage, mass effect or midline shift.  No abnormal extra-axial fluid  collections. Basal cisterns are patent. Mild calcific atherosclerosis of the carotid siphons.  No skull fracture. The included ocular globes and orbital contents are non-suspicious. Status post bilateral ocular lens implants. Trace LEFT mastoid effusion. The paranasal sinuses are well aerated. Patient is edentulous.  IMPRESSION: No acute intracranial process ; stable appearance the head from April 30, 2014.   Electronically Signed   By: Awilda Metro   On: 07/28/2014 05:22    EKG: NSR  no ST-T changes  Assessment/Plan  Principal Problem:   Epileptic seizures Suspected possibly in the setting of benzodiazepine withdrawal. Patient loaded with IV Keppra in the ED. Head CT unremarkable. -Admit to neuro telemetry. Neuro checks every 4 hours, seizure precautions. -Continue Keppra 500 mg twice a day. Resume home dose Valium (which she takes for essential tremors) with  recommendations for slow weaning per neurology. IV Ativan when necessary for seizures. -Patient had MRI brain done in October 2015 which showed stable mild generalized atrophy. -EEG ordered. Check urine drug screen. -Appreciate neurology recommendations.   Active Problems: Tobacco abuse Counseled on suggestion. Ordered nicotine patch  Protein calorie malnutrition Nutrition consulted.  Hypokalemia Replenish with IV fluids and po kcl. Check mg.  Chest pain Symptoms unclear. Patient denies having symptoms to me but reported to the ED physician as having substernal chest pains. Will cycle serial troponins and monitor on telemetry. EKG unremarkable. Continue home dose aspirin.  Wheezing  noted in ED. CXR unremarkable. Improves improved after receiving albuterol nebulizer in the ED. Will continue with when necessary nebs.  Leukocytosis Possibly related to acute seizure activity. Check UA. Chest x-ray unremarkable.  Chronic pain Resume home dose Percocet.  Essential tremors On chronic Valium  Chronic depression Continue citalopram.  Mid thoracic compression fracture on CXR Age undeterminate. Will follow with PT. Further imaging if symptoms more pronounced.  Diet:regular  DVT prophylaxis: sq lovenox   Code Status: full code Family Communication: unable to reach son Sam on the phone. Disposition Plan: admit to tele  Eddie North [facility] Pager [phone]  Total time spent on admission :70 minutes  If 7PM-7AM, please contact night-coverage [email or web address] Password [email or web address] 07/28/2014, 8:00 AM

## 2014-07-28 NOTE — ED Notes (Signed)
Attempt to call report to floor x1. 

## 2014-07-29 DIAGNOSIS — Z720 Tobacco use: Secondary | ICD-10-CM

## 2014-07-29 DIAGNOSIS — G40909 Epilepsy, unspecified, not intractable, without status epilepticus: Secondary | ICD-10-CM | POA: Diagnosis not present

## 2014-07-29 DIAGNOSIS — G250 Essential tremor: Secondary | ICD-10-CM

## 2014-07-29 LAB — URINALYSIS, ROUTINE W REFLEX MICROSCOPIC
Bilirubin Urine: NEGATIVE
Glucose, UA: NEGATIVE mg/dL
Ketones, ur: NEGATIVE mg/dL
Leukocytes, UA: NEGATIVE
Nitrite: NEGATIVE
PROTEIN: NEGATIVE mg/dL
Specific Gravity, Urine: 1.018 (ref 1.005–1.030)
UROBILINOGEN UA: 1 mg/dL (ref 0.0–1.0)
pH: 5.5 (ref 5.0–8.0)

## 2014-07-29 LAB — BASIC METABOLIC PANEL
Anion gap: 3 — ABNORMAL LOW (ref 5–15)
BUN: 7 mg/dL (ref 6–23)
CHLORIDE: 106 mmol/L (ref 96–112)
CO2: 29 mmol/L (ref 19–32)
Calcium: 8.4 mg/dL (ref 8.4–10.5)
Creatinine, Ser: 0.77 mg/dL (ref 0.50–1.35)
GFR calc Af Amer: >90 mL/min (ref >90)
GFR calc non Af Amer: >90 mL/min (ref >90)
GLUCOSE: 95 mg/dL (ref 70–99)
Potassium: 4.6 mmol/L (ref 3.5–5.1)
Sodium: 138 mmol/L (ref 135–145)

## 2014-07-29 LAB — URINE MICROSCOPIC-ADD ON

## 2014-07-29 LAB — CBC
HEMATOCRIT: 36.7 % — AB (ref 39.0–52.0)
Hemoglobin: 12.6 g/dL — ABNORMAL LOW (ref 13.0–17.0)
MCH: 33.2 pg (ref 26.0–34.0)
MCHC: 34.3 g/dL (ref 30.0–36.0)
MCV: 96.6 fL (ref 78.0–100.0)
Platelets: 180 10*3/uL (ref 150–400)
RBC: 3.8 MIL/uL — ABNORMAL LOW (ref 4.22–5.81)
RDW: 13.2 % (ref 11.5–15.5)
WBC: 10.5 10*3/uL (ref 4.0–10.5)

## 2014-07-29 LAB — TSH: TSH: 4.714 u[IU]/mL — AB (ref 0.350–4.500)

## 2014-07-29 MED ORDER — NICOTINE 14 MG/24HR TD PT24: 14.0000 mg | MEDICATED_PATCH | Freq: Every day | TRANSDERMAL | Status: DC

## 2014-07-29 MED ORDER — ASPIRIN EC 81 MG PO TBEC
81.0000 mg | DELAYED_RELEASE_TABLET | Freq: Every day | ORAL | Status: DC
Start: 2014-07-29 — End: 2015-03-20

## 2014-07-29 MED ORDER — ENSURE COMPLETE PO LIQD: 237.0000 mL | Freq: Three times a day (TID) | ORAL | Status: DC

## 2014-07-29 MED ORDER — LEVETIRACETAM 500 MG PO TABS: 500.0000 mg | ORAL_TABLET | Freq: Two times a day (BID) | ORAL | Status: DC

## 2014-07-29 MED ORDER — CITALOPRAM HYDROBROMIDE 20 MG PO TABS: 40.0000 mg | ORAL_TABLET | Freq: Every day | ORAL | Status: DC

## 2014-07-29 NOTE — Progress Notes (Signed)
Pt is ready for DC home accompanied by family. Pt reports he understands all DC medications, follow up appointments, and instructions.   Ronald [name], RCharity fundraiser

## 2014-07-29 NOTE — Discharge Instructions (Signed)
Malnutrition Many of us think of malnutrition as a condition in which there is not enough to eat. Malnutrition is actually any condition where nutrition is poor. This means:  Too much to eat as we see in conditions of obesity.  Too little to eat with starvation. The following information is only for the malnourished with dietary deficiencies (poor diet). CAUSES  Under-nutrition can result from:  Poor intake.  Malabsorption  Lactation  Bleeding  Diarrhea  Old age.  Kidney failure.  Infancy  Poverty  Infection  Adolescence  Excessive sweating  Drug addiction  Pregnancy  Early childhood. Under-nutrition comes anytime the demand is more than the intake. SYMPTOMS  The problems depend on what type of malnutrition is present. Some general symptoms include:  Fatigue.  Dizziness.  Fainting  Weight loss.  Poor immune response.  Lack of menstruation.  Lack of growth in children.  Hair loss. DIAGNOSIS  Your caregiver will usually suspect malnutrition based on results of your:   Medical and dietary history.  Physical exam. This will often include measurements of your BMI (body mass index).  Perhaps some blood tests. These may include: plasma levels of nutrients and nutrient-dependent substances, such as:  Hemoglobin  Thyroid hormones  Transferrin  Albumin RISK FACTORS Persons in the following circumstances may be at risk of malnutrition.  Infants and children are at risk of under-nutrition. This is because of their high demand for energy and essential nutrients. Protein-energy malnutrition in children consuming inadequate amounts of protein, calories, and other nutrients is a particularly severe form of under-nutrition that delays growth and development. This includes [place] and [place].  Hemorrhagic disease of the newborn is a life-threatening disorder. This is due to a lack of vitamin K, iron, folic acid, vitamin C, copper, zinc, and vitamin  A. This may occur in inadequately fed infants and children.  In adolescence, nutritional requirements increase because they are growing. Anorexia nervosa, a form of starvation, may affect adolescents.  Pregnancy and lactation. Requirements for all nutrients are increased during pregnancy and lactation.  Abnormal diets, such as pica (the consumption of nonnutritive substances, such as clay and charcoal), are common in pregnancy.  Anemia due to folic acid deficiency is common in pregnant women. This is especially true for those who have taken oral contraceptives. Folic acid supplements are now recommended for pregnant women. Folic acid prevents neural tube defects (spina bifida) in children.  Breast-fed-only infants may develop vitamin B12 deficiency if the mother is a vegan.  An alcoholic mother may have a handicapped and stunted child with fetal alcohol syndrome. This is due to the effects of alcohol on the fetus. Do not drink during pregnancy.  Old age: A weakened sense of taste and smell, loneliness, physical and mental handicaps, immobility, and chronic illness can hurt the food intake in the elderly. Absorption is reduced. This may add to iron deficiency, calcium and bone problems and also a softening of the bones due to lack of vitamin D. This is also made worse by not being in the sun.  With aging, we loose lean body mass. These changes and a reduction in physical activity result in lower energy and protein requirements compared with those of younger adults.  Chronic disease including malabsorption states (including those resulting from surgery) tend to impair the absorption of fat-soluble vitamins, vitamin B12, calcium, and iron.  Liver disease impairs the storage of vitamins A and B12. It also interferes with the metabolism of protein and energy sources.  Kidney disease may   cause deficiencies of protein, iron, and vitamin D.  Cancer and AIDS may cause anorexia. This is a loss of  appetite.  Vegetarian diet. The most common form of this type of diet is when meat and fish are not eaten, but eggs and dairy products are eaten. Iron deficiency is the only risk. Ovo-lacto vegetarians tend to live longer and to develop fewer chronic disabling conditions than their meat-eating peers. However, their lifestyle usually includes regular exercise and abstention from alcohol and tobacco. This may contribute to better health. Vegans consume no animal products and are susceptible to vitamin B12 deficiency. Yeast extracts and oriental-style fermented foods provide this vitamin. Intake of calcium, iron, and zinc also tends to be low. A fruitarian diet (eat only fruit) is deficient in protein, salt, and many micronutrients. This is not recommended.  Fad diets: Many commercial diets are claimed to enhance well-being or reduce weight. A physician should be alert to early evidence of nutrient deficiency or toxicity in patients on these diets. Such diets have resulted in vitamin, mineral, and protein deficiency states and cardiac, renal, and metabolic disorders. Some fad diets have resulted in death. People on very low calorie diets (less than 400 kcal/day) cannot sustain health for long. Some trace mineral supplements have induced toxicity.  Alcohol or drug dependency: Addiction leads to a troubled lifestyle in which adequate nourishment is ignored. Absorption and metabolism of nutrients are impaired. High levels of alcohol are poisonous. Too much alcohol can cause tissue injury, particularly of the GI tract, liver, pancreas, brain, and peripheral nervous system. Beer drinkers who consume food may gain weight, but alcoholics who use more than one quart of hard liquor per day lose weight and become undernourished. Drug addicts are usually very skinny. Alcoholism is the most common cause of thiamine deficiency and may lead to deficiencies of magnesium, zinc, and other vitamins. TREATMENT  Get treatment if  you experience changes in how your body is working.  PREVENTION  Eating a good, well-balanced diet helps to prevent most forms of malnutrition. Document Released: 04/14/2005 Document Revised: 08/21/2011 Document Reviewed: 05/06/2007 [facility] Patient Information 2015 [place]. This information is not intended to replace advice given to you by your health care provider. Make sure you discuss any questions you have with your health care provider.  Seizure, Adult A seizure means there is unusual activity in the brain. A seizure can cause changes in attention or behavior. Seizures often cause shaking (convulsions). Seizures often last from 30 seconds to 2 minutes. HOME CARE   If you are given medicines, take them exactly as told by your doctor.  Keep all doctor visits as told.  Do not swim or drive until your doctor says it is okay.  Teach others what to do if you have a seizure. They should:  Lay you on the ground.  Put a cushion under your head.  Loosen any tight clothing around your neck.  Turn you on your side.  Stay with you until you get better. GET HELP RIGHT AWAY IF:   The seizure lasts longer than 2 to 5 minutes.  The seizure is very bad.  The person does not wake up after the seizure.  The person's attention or behavior changes. Drive the person to the emergency room or call your local emergency services (911 in [place] MAKE SURE YOU:   Understand these instructions.  Will watch your condition.  Will get help right away if you are not doing well or get worse. Document Released:  11/15/2007 Document Revised: 08/21/2011 Document Reviewed: 05/17/2011 ExitCare Patient Information 2015 [place], [place]. This information is not intended to replace advice given to you by your health care provider. Make sure you discuss any questions you have with your health care provider.

## 2014-07-29 NOTE — Progress Notes (Signed)
Subjective: No complaints and no further events over night.   Objective: Current vital signs: BP 113/67 mmHg  Pulse 69  Temp(Src) 98.4 F (36.9 C) (Oral)  Resp 22  Ht 6' (1.829 m)  Wt 56.201 kg (123 lb 14.4 oz)  BMI 16.80 kg/m2  SpO2 95% Vital signs in last 24 hours: Temp:  [97.9 F (36.6 C)-99 F (37.2 C)] 98.4 F (36.9 C) (02/17 0400) Pulse Rate:  [61-71] 69 (02/17 0400) Resp:  [15-23] 22 (02/17 0400) BP: (76-120)/(46-87) 113/67 mmHg (02/17 0400) SpO2:  [93 %-100 %] 95 % (02/17 0400) Weight:  [54.205 kg (119 lb 8 oz)-56.201 kg (123 lb 14.4 oz)] 56.201 kg (123 lb 14.4 oz) (02/17 0400)  Intake/Output from previous day: 02/16 0701 - 02/17 0700 In: 2097 [P.O.:597; I.V.:1500] Out: 1085 [Urine:1085] Intake/Output this shift:   Nutritional status: Diet regular  Neurologic Exam: General: Mental Status: Alert, oriented, thought content appropriate.  Speech fluent without evidence of aphasia.  Able to follow 3 step commands without difficulty. Cranial Nerves: II: Visual fields grossly normal, pupils equal, round, reactive to light and accommodation III,IV, VI: ptosis not present, extra-ocular motions intact bilaterally V,VII: smile symmetric, facial light touch sensation normal bilaterally VIII: hearing normal bilaterally IX,X: gag reflex present XI: bilateral shoulder shrug XII: midline tongue extension without atrophy or fasciculations  Motor: Right : Upper extremity   5/5    Left:     Upper extremity   5/5  Lower extremity   5/5     Lower extremity   5/5 Tone and bulk:normal tone throughout; no atrophy noted Sensory: Pinprick and light touch intact throughout, bilaterally Deep Tendon Reflexes:  Right: Upper Extremity   Left: Upper extremity   biceps (C-5 to C-6) 2/4   biceps (C-5 to C-6) 2/4 tricep (C7) 2/4    triceps (C7) 2/4 Brachioradialis (C6) 2/4  Brachioradialis (C6) 2/4  Lower Extremity Lower Extremity  quadriceps (L-2 to L-4) 2/4   quadriceps (L-2 to L-4)  2/4 Achilles (S1) 2/4   Achilles (S1) 2/4  Plantars: Right: downgoing   Left: downgoing    Lab Results: Basic Metabolic Panel:  Recent Labs Lab 07/28/14 0451 07/28/14 0805 07/29/14 0435  NA 135  --  138  K 3.4*  --  4.6  CL 94*  --  106  CO2  --   --  29  GLUCOSE 150*  --  95  BUN 7  --  7  CREATININE 0.70  --  0.77  CALCIUM  --   --  8.4  MG  --  2.1  --     Liver Function Tests: No results for input(s): AST, ALT, ALKPHOS, BILITOT, PROT, ALBUMIN in the last 168 hours. No results for input(s): LIPASE, AMYLASE in the last 168 hours. No results for input(s): AMMONIA in the last 168 hours.  CBC:  Recent Labs Lab 07/28/14 0440 07/28/14 0451 07/29/14 0435  WBC 19.1*  --  10.5  NEUTROABS 17.1*  --   --   HGB 14.0 15.0 12.6*  HCT 40.9 44.0 36.7*  MCV 94.2  --  96.6  PLT 198  --  180    Cardiac Enzymes:  Recent Labs Lab 07/28/14 0805 07/28/14 1548 07/28/14 2140  TROPONINI <0.03 <0.03 <0.03    Lipid Panel: No results for input(s): CHOL, TRIG, HDL, CHOLHDL, VLDL, LDLCALC in the last 168 hours.  CBG:  Recent Labs Lab 07/28/14 1005  GLUCAP 126*    Microbiology: Results for orders placed or performed during  the hospital encounter of 07/28/14  MRSA PCR Screening     Status: None   Collection Time: 07/28/14  6:32 PM  Result Value Ref Range Status   MRSA by PCR NEGATIVE NEGATIVE Final    Comment:        The GeneXpert MRSA Assay (FDA approved for NASAL specimens only), is one component of a comprehensive MRSA colonization surveillance program. It is not intended to diagnose MRSA infection nor to guide or monitor treatment for MRSA infections.     Coagulation Studies: No results for input(s): LABPROT, INR in the last 72 hours.  Imaging: Dg Chest 2 View  07/28/2014   CLINICAL DATA:  Seizures, unable to communicate. History of cognitive impairment.  EXAM: CHEST  2 VIEW  COMPARISON:  None.  FINDINGS: Cardiac silhouette is unremarkable, tortuous  mildly calcified aorta. Increased lung volumes with mild chronic interstitial changes. No pleural effusion or focal consolidation. No pneumothorax.  Osteopenia. Moderate degenerative change of thoracic spine. Moderate mid thoracic compression fracture. Partially imaged ACDF appear  IMPRESSION: No acute cardiopulmonary process.  Moderate mid thoracic compression fracture, age indeterminate. Recommend correlation point tenderness.   Electronically Signed   By: Awilda Metro   On: 07/28/2014 05:33   Ct Head Wo Contrast  07/28/2014   CLINICAL DATA:  Four seizures at home, found unresponsive, CPR performed.  EXAM: CT HEAD WITHOUT CONTRAST  TECHNIQUE: Contiguous axial images were obtained from the base of the skull through the vertex without intravenous contrast.  COMPARISON:  CT of the head April 30, 2014  FINDINGS: No acute large vascular territory infarcts. Ventricles are sulci are normal for patient's age. Minimal white matter changes can be seen with chronic small vessel ischemic disease. No intraparenchymal hemorrhage, mass effect or midline shift.  No abnormal extra-axial fluid collections. Basal cisterns are patent. Mild calcific atherosclerosis of the carotid siphons.  No skull fracture. The included ocular globes and orbital contents are non-suspicious. Status post bilateral ocular lens implants. Trace LEFT mastoid effusion. The paranasal sinuses are well aerated. Patient is edentulous.  IMPRESSION: No acute intracranial process ; stable appearance the head from April 30, 2014.   Electronically Signed   By: Awilda Metro   On: 07/28/2014 05:22    Medications:  Scheduled: . aspirin  325 mg Oral Daily  . citalopram  20 mg Oral QHS  . diazepam  5 mg Oral BID  . enoxaparin (LOVENOX) injection  40 mg Subcutaneous Q24H  . feeding supplement (ENSURE COMPLETE)  237 mL Oral TID BM  . folic acid  1 mg Oral Daily  . levETIRAcetam  500 mg Oral BID  . multivitamin with minerals  1 tablet Oral  Daily  . nicotine  14 mg Transdermal Daily  . pneumococcal 23 valent vaccine  0.5 mL Intramuscular Tomorrow-1000  . sodium chloride  3 mL Intravenous Q12H  . thiamine  100 mg Oral Daily    Assessment/Plan: 59 yo M with seizures in the setting of likely benzodiazepine withdrawal. He has had no further episodes while in hospital and  with keppra. He has had an MRI since these started and CT is negative,and repeat EEG was negative.    Recommendations:  1) Keppra  BID for now. With follow up as out patient with PCP or neurology. After weaned off Valium may D/C Keppra 2) Patient will need slow wean from valium 3) Follow up with Dr. Everlena Cooper as out patient.   Neurology S/O  Felicie Morn PA-C [facility] [phone]  07/29/2014,  8:52 AM

## 2014-07-29 NOTE — Discharge Summary (Signed)
Discharge Summary  Ronald [name] Sr. RUE:[id number] DOB: 08-10-1955  PCP: Aida Puffer, MD  Admit date: 07/28/2014 Discharge date: 07/29/2014  Time spent: less than  Recommendations for Outpatient Follow-up:  1. F/u with pcp, pcp to supervise slow valium taper, significant weight loss, smoking cessation 2. F/u with neurology 3. Home health/PT  Discharge Diagnoses:  Active Hospital Problems   Diagnosis Date Noted  . Epileptic seizures 07/28/2014  . Tobacco abuse 07/28/2014  . Protein calorie malnutrition 07/28/2014  . Chest pain 07/28/2014  . Epileptic seizure, generalized 07/28/2014  . Essential tremor 01/08/2014    Resolved Hospital Problems   Diagnosis Date Noted Date Resolved  No resolved problems to display.    Discharge Condition: stable  Diet recommendation: regular diet  Filed Weights   07/28/14 0819 07/28/14 1342 07/29/14 0400  Weight: 53.434 kg (117 lb 12.8 oz) 54.205 kg (119 lb 8 oz) 56.201 kg (123 lb 14.4 oz)    History of present illness:  59 year old male with history of chronic back and neck pain who follows with neurologist Dr. Everlena Cooper for cognitive impairment and essential tremor (on chronic Valium), who had an episode of generalized tonic-clonic seizure in July 2015. He had an MRI of his brain and EEG during that time which were unremarkable. Patient was not started on any antiepileptics. He did not have any further seizure activity until last night when patient was found to have a total of 4 seizure activities when his son noticed patient to have a blank stare prior to the seizures and then his right arm flexed and the left arm extended. His last seizure was thought to be due to reduction of the benzodiazepine dose and starting bupropion. His son also reported that during one of the seizures last night , patient did not have a pulse and he had to briefly perform CPR on him. The ED physician's note reports that patient also was incontinent of urine. His  son reported that patient has varying dose of Valium as outpatient and he was concerned about overuse as well. Patient also reported having some chest pain to the ED physician.  In the ED patient's vitals were stable except for briefly being hypoxic to 85% on room air. Patient was also found to be wheezy on exam. Drug work done showed WBC of 19.1, normal hemoglobin and platelets. MSG show sodium of 135, potassium of 3.4, chloride of 94, BUN of 7 and creatinine of 0.7, glucose of 150. Chest x-ray was unremarkable. Head CT was negative for acute findings. EKG showed normal sinus rhythm without ST-T changes. Initial troponin was negative. Patient had elevated venous lactate, with subsequent level being normal. Tylenol and salicylate levels were normal as well. Patient was given a loading dose of IV Keppra 1000 mg in the ED and neuro hospitalist was consulted. He was also given a dose of albuterol nebulizer. Hospitalist admission requested.  Patient denies headache, dizziness, fever, chills, nausea , vomiting, palpitations, SOB, abdominal pain, bowel symptoms. Denies recent illness or missing out on his medications. Reports chronic neck and back pain.   Hospital Course:  Principal Problem:   Epileptic seizures Active Problems:   Essential tremor   Tobacco abuse   Protein calorie malnutrition   Chest pain   Epileptic seizure, generalized Epileptic seizures 59 yo M with seizures in the setting of likely benzodiazepine withdrawal. He has had no further episodes while in hospital and with keppra. He has had an MRI since these started and CT is negative,and repeat EEG  was negative  1) Keppra  BID for now. With follow up as out patient with PCP or neurology. After weaned off Valium may D/C Keppra 2) Patient will need slow wean from valium 3) Follow up with Dr. Everlena Cooper as out patient. Suspected possibly in the setting of benzodiazepine withdrawal.   Active Problems: Tobacco abuse Counseled  on suggestion. Ordered nicotine patch  Protein calorie malnutrition Nutrition consulted. Nutrition supplement provided, patient reported significant weight loss, pcp to f/u weight loss issues/cancer screening/management of depression etc.  Hypokalemia Replenish with IV fluids and po kcl. Mg wnl.  Chest pain Symptoms unclear. Patient denies having symptoms to me but reported to the ED physician as having substernal chest pains. Will cycle serial troponins and monitor on telemetry. EKG unremarkable. Continue home dose aspirin decreased to  po qd.  Wheezing noted in ED. CXR unremarkable. Improves improved after receiving albuterol nebulizer in the ED.  No wheezing at time of discharge, encourage smoking cessation.  Leukocytosis Possibly related to acute seizure activity. unremarkable UA. Chest x-ray unremarkable. resollved.  Chronic pain Resume home dose Percocet.  Essential tremors On chronic Valium, taper off slowly  Chronic depression Continue citalopram. Outpatient psych referral per pmd  Procedures:  Ct/mri/EEG  Consultations:  neurology  Discharge Exam: BP 113/67 mmHg  Pulse 69  Temp(Src) 98.4 F (36.9 C) (Oral)  Resp 22  Ht 6' (1.829 m)  Wt 56.201 kg (123 lb 14.4 oz)  BMI 16.80 kg/m2  SpO2 95%  Constitutional: Vital signs reviewed. Patient is a cachectic male lying in bed in no acute distress. Aaox3, sitting in chair HEENT: dilated pupils, no pallor, no icterus, moist oral mucosa, no cervical lymphadenopathy, supple neck Cardiovascular: RRR, S1 normal, S2 normal, no MRG Chest: CTAB,  no crackles, no wheezing Abdominal: Soft. Non-tender, non-distended, bowel sounds are normal, no masses, organomegaly, or guarding present.  Ext: warm, no edema Neurological: AAox3, cranial nerves intact, resting tremors, bilateral strength and sensory function intact.  Patient reported back to baseline. At baseline, he walk with a cane due to imbalance.   Discharge  Instructions You were cared for by a hospitalist during your hospital stay. If you have any questions about your discharge medications or the care you received while you were in the hospital after you are discharged, you can call the unit and asked to speak with the hospitalist on call if the hospitalist that took care of you is not available. Once you are discharged, your primary care physician will handle any further medical issues. Please note that NO REFILLS for any discharge medications will be authorized once you are discharged, as it is imperative that you return to your primary care physician (or establish a relationship with a primary care physician if you do not have one) for your aftercare needs so that they can reassess your need for medications and monitor your lab values.  Discharge Instructions    Diet - low sodium heart healthy    Complete by:  As directed      Face-to-face encounter (required for Medicare/Medicaid patients)    Complete by:  As directed   I Dustee Bottenfield certify that this patient is under my care and that I, or a nurse practitioner or physician's assistant working with me, had a face-to-face encounter that meets the physician face-to-face encounter requirements with this patient on 07/29/2014. The encounter with the patient was in whole, or in part for the following medical condition(s) which is the primary reason for home health care (List medical condition):  Seizure/FTT  The encounter with the patient was in whole, or in part, for the following medical condition, which is the primary reason for home health care:  seizure/FTT  I certify that, based on my findings, the following services are medically necessary home health services:  Physical therapy  Reason for Medically Necessary Home Health Services:  Skilled Nursing- Change/Decline in Patient Status  My clinical findings support the need for the above services:  Unable to leave home safely without assistance and/or assistive  device  Further, I certify that my clinical findings support that this patient is homebound due to:  Unable to leave home safely without assistance     Home Health    Complete by:  As directed   To provide the following care/treatments:  PT     Increase activity slowly    Complete by:  As directed             Medication List    STOP taking these medications        aspirin 325 MG tablet  Replaced by:  aspirin EC 81 MG tablet     naproxen sodium 220 MG tablet  Commonly known as:  ANAPROX      TAKE these medications        aspirin EC 81 MG tablet  Take 1 tablet (81 mg total) by mouth daily.     citalopram 40 MG tablet  Commonly known as:  CELEXA  Take 40 mg by mouth daily.     diazepam 5 MG tablet  Commonly known as:  VALIUM  Take 5 mg by mouth 2 (two) times daily.     feeding supplement (ENSURE COMPLETE) Liqd  Take 237 mLs by mouth 3 (three) times daily between meals.     levETIRAcetam 500 MG tablet  Commonly known as:  KEPPRA  Take 1 tablet (500 mg total) by mouth 2 (two) times daily.     nicotine 14 mg/24hr patch  Commonly known as:  NICODERM CQ - dosed in mg/24 hours  Place 1 patch (14 mg total) onto the skin daily.     oxyCODONE-acetaminophen 5-325 MG per tablet  Commonly known as:  PERCOCET/ROXICET  Take 1 tablet by mouth every 4 (four) hours as needed for severe pain.       Allergies  Allergen Reactions  . Antihistamines, Diphenhydramine-Type Other (See Comments)    Speeds up heart rate.  . Codeine Nausea And Vomiting       Follow-up Information    Follow up with [facility].   Why:  HOME HEALTH PHYSICAL THERAPY   Contact information:   [address] [place] [address] [phone]       Follow up with JAFFE, ADAM ROBERT, DO In 2 weeks.   Specialty:  Neurology   Contact information:   [address] [place] [address] [phone]       Follow up with Aida PufferLITTLE,JAMES, MD In 1 week.    Specialty:  Family Medicine   Contact information:   [address] [place] [address] [phone]        The results of significant diagnostics from this hospitalization (including imaging, microbiology, ancillary and laboratory) are listed below for reference.    Significant Diagnostic Studies: Dg Chest 2 View  07/28/2014   CLINICAL DATA:  Seizures, unable to communicate. History of cognitive impairment.  EXAM: CHEST  2 VIEW  COMPARISON:  None.  FINDINGS: Cardiac silhouette is unremarkable,  tortuous mildly calcified aorta. Increased lung volumes with mild chronic interstitial changes. No pleural effusion or focal consolidation. No pneumothorax.  Osteopenia. Moderate degenerative change of thoracic spine. Moderate mid thoracic compression fracture. Partially imaged ACDF appear  IMPRESSION: No acute cardiopulmonary process.  Moderate mid thoracic compression fracture, age indeterminate. Recommend correlation point tenderness.   Electronically Signed   By: Awilda Metro   On: 07/28/2014 05:33   Ct Head Wo Contrast  07/28/2014   CLINICAL DATA:  Four seizures at home, found unresponsive, CPR performed.  EXAM: CT HEAD WITHOUT CONTRAST  TECHNIQUE: Contiguous axial images were obtained from the base of the skull through the vertex without intravenous contrast.  COMPARISON:  CT of the head April 30, 2014  FINDINGS: No acute large vascular territory infarcts. Ventricles are sulci are normal for patient's age. Minimal white matter changes can be seen with chronic small vessel ischemic disease. No intraparenchymal hemorrhage, mass effect or midline shift.  No abnormal extra-axial fluid collections. Basal cisterns are patent. Mild calcific atherosclerosis of the carotid siphons.  No skull fracture. The included ocular globes and orbital contents are non-suspicious. Status post bilateral ocular lens implants. Trace LEFT mastoid effusion. The paranasal sinuses are well aerated. Patient is edentulous.   IMPRESSION: No acute intracranial process ; stable appearance the head from April 30, 2014.   Electronically Signed   By: Awilda Metro   On: 07/28/2014 05:22    Microbiology: Recent Results (from the past 240 hour(s))  MRSA PCR Screening     Status: None   Collection Time: 07/28/14  6:32 PM  Result Value Ref Range Status   MRSA by PCR NEGATIVE NEGATIVE Final    Comment:        The GeneXpert MRSA Assay (FDA approved for NASAL specimens only), is one component of a comprehensive MRSA colonization surveillance program. It is not intended to diagnose MRSA infection nor to guide or monitor treatment for MRSA infections.      Labs: Basic Metabolic Panel:  Recent Labs Lab 07/28/14 0451 07/28/14 0805 07/29/14 0435  NA 135  --  138  K 3.4*  --  4.6  CL 94*  --  106  CO2  --   --  29  GLUCOSE 150*  --  95  BUN 7  --  7  CREATININE 0.70  --  0.77  CALCIUM  --   --  8.4  MG  --  2.1  --    Liver Function Tests: No results for input(s): AST, ALT, ALKPHOS, BILITOT, PROT, ALBUMIN in the last 168 hours. No results for input(s): LIPASE, AMYLASE in the last 168 hours. No results for input(s): AMMONIA in the last 168 hours. CBC:  Recent Labs Lab 07/28/14 0440 07/28/14 0451 07/29/14 0435  WBC 19.1*  --  10.5  NEUTROABS 17.1*  --   --   HGB 14.0 15.0 12.6*  HCT 40.9 44.0 36.7*  MCV 94.2  --  96.6  PLT 198  --  180   Cardiac Enzymes:  Recent Labs Lab 07/28/14 0805 07/28/14 1548 07/28/14 2140  TROPONINI <0.03 <0.03 <0.03   BNP: BNP (last 3 results) No results for input(s): BNP in the last 8760 hours.  ProBNP (last 3 results) No results for input(s): PROBNP in the last 8760 hours.  CBG:  Recent Labs Lab 07/28/14 1005  GLUCAP 126*       Signed:  Dexter Sauser  [facility] 07/29/2014, 12:50 PM

## 2014-08-01 ENCOUNTER — Encounter (HOSPITAL_COMMUNITY): Payer: Self-pay | Admitting: Adult Health

## 2014-08-01 ENCOUNTER — Emergency Department (HOSPITAL_COMMUNITY)
Admission: EM | Admit: 2014-08-01 | Discharge: 2014-08-01 | Disposition: A | Discharge status: Home / Self Care | Payer: Medicare Other | Attending: Emergency Medicine | Admitting: Emergency Medicine

## 2014-08-01 DIAGNOSIS — M549 Dorsalgia, unspecified: Secondary | ICD-10-CM | POA: Diagnosis present

## 2014-08-01 DIAGNOSIS — F319 Bipolar disorder, unspecified: Secondary | ICD-10-CM | POA: Diagnosis not present

## 2014-08-01 DIAGNOSIS — G40909 Epilepsy, unspecified, not intractable, without status epilepticus: Secondary | ICD-10-CM | POA: Diagnosis not present

## 2014-08-01 DIAGNOSIS — Z720 Tobacco use: Secondary | ICD-10-CM | POA: Diagnosis not present

## 2014-08-01 DIAGNOSIS — Z9889 Other specified postprocedural states: Secondary | ICD-10-CM | POA: Insufficient documentation

## 2014-08-01 DIAGNOSIS — Z7982 Long term (current) use of aspirin: Secondary | ICD-10-CM | POA: Diagnosis not present

## 2014-08-01 DIAGNOSIS — G8929 Other chronic pain: Secondary | ICD-10-CM | POA: Diagnosis not present

## 2014-08-01 MED ORDER — CYCLOBENZAPRINE HCL 10 MG PO TABS
10.0000 mg | ORAL_TABLET | Freq: Once | ORAL | Status: AC
  Administered 2014-08-01: 10 mg via ORAL
  Filled 2014-08-01: qty 1

## 2014-08-01 MED ORDER — HYDROMORPHONE HCL 1 MG/ML IJ SOLN
1.0000 mg | Freq: Once | INTRAMUSCULAR | Status: AC
  Administered 2014-08-01: 1 mg via INTRAMUSCULAR
  Filled 2014-08-01: qty 1

## 2014-08-01 MED ORDER — OXYCODONE-ACETAMINOPHEN 5-325 MG PO TABS: 1.0000 | ORAL_TABLET | ORAL | Status: DC | PRN

## 2014-08-01 MED ORDER — CYCLOBENZAPRINE HCL 10 MG PO TABS: 10.0000 mg | ORAL_TABLET | Freq: Three times a day (TID) | ORAL | Status: DC | PRN

## 2014-08-01 NOTE — ED Notes (Signed)
[facility] for malnutrition and seizures, since being home, reports increase in back pian from neck down to lower back-he currently takes oxycodone 5/325 which usually helps but has not since home. He is unable to eat and unable to care for self due to pain. Pt is emaciated.

## 2014-08-01 NOTE — Discharge Instructions (Signed)
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
## 2014-10-08 ENCOUNTER — Ambulatory Visit: Payer: Medicare Other | Admitting: Neurology

## 2014-10-20 ENCOUNTER — Ambulatory Visit: Payer: Medicare Other | Admitting: Neurology

## 2014-11-24 ENCOUNTER — Encounter (HOSPITAL_COMMUNITY): Payer: Self-pay | Admitting: *Deleted

## 2014-11-24 ENCOUNTER — Emergency Department (HOSPITAL_COMMUNITY)
Admission: EM | Admit: 2014-11-24 | Discharge: 2014-11-24 | Disposition: A | Discharge status: Home / Self Care | Payer: Medicare Other | Attending: Emergency Medicine | Admitting: Emergency Medicine

## 2014-11-24 DIAGNOSIS — G40909 Epilepsy, unspecified, not intractable, without status epilepticus: Secondary | ICD-10-CM | POA: Diagnosis not present

## 2014-11-24 DIAGNOSIS — Z79899 Other long term (current) drug therapy: Secondary | ICD-10-CM | POA: Insufficient documentation

## 2014-11-24 DIAGNOSIS — Z7982 Long term (current) use of aspirin: Secondary | ICD-10-CM | POA: Insufficient documentation

## 2014-11-24 DIAGNOSIS — R569 Unspecified convulsions: Secondary | ICD-10-CM | POA: Diagnosis present

## 2014-11-24 DIAGNOSIS — Z720 Tobacco use: Secondary | ICD-10-CM | POA: Insufficient documentation

## 2014-11-24 LAB — CBC WITH DIFFERENTIAL/PLATELET
BASOS ABS: 0 10*3/uL (ref 0.0–0.1)
Basophils Relative: 0 % (ref 0–1)
EOS PCT: 2 % (ref 0–5)
Eosinophils Absolute: 0.2 10*3/uL (ref 0.0–0.7)
HCT: 45.5 % (ref 39.0–52.0)
Hemoglobin: 14.8 g/dL (ref 13.0–17.0)
LYMPHS PCT: 33 % (ref 12–46)
Lymphs Abs: 3.1 10*3/uL (ref 0.7–4.0)
MCH: 32.2 pg (ref 26.0–34.0)
MCHC: 32.5 g/dL (ref 30.0–36.0)
MCV: 99.1 fL (ref 78.0–100.0)
MONO ABS: 0.5 10*3/uL (ref 0.1–1.0)
Monocytes Relative: 5 % (ref 3–12)
Neutro Abs: 5.7 10*3/uL (ref 1.7–7.7)
Neutrophils Relative %: 60 % (ref 43–77)
PLATELETS: 265 10*3/uL (ref 150–400)
RBC: 4.59 MIL/uL (ref 4.22–5.81)
RDW: 12.9 % (ref 11.5–15.5)
WBC: 9.4 10*3/uL (ref 4.0–10.5)

## 2014-11-24 LAB — CBG MONITORING, ED: GLUCOSE-CAPILLARY: 71 mg/dL (ref 65–99)

## 2014-11-24 LAB — URINALYSIS, ROUTINE W REFLEX MICROSCOPIC
Bilirubin Urine: NEGATIVE
Glucose, UA: NEGATIVE mg/dL
HGB URINE DIPSTICK: NEGATIVE
Ketones, ur: NEGATIVE mg/dL
Leukocytes, UA: NEGATIVE
Nitrite: NEGATIVE
PH: 7.5 (ref 5.0–8.0)
PROTEIN: NEGATIVE mg/dL
SPECIFIC GRAVITY, URINE: 1.01 (ref 1.005–1.030)
UROBILINOGEN UA: 1 mg/dL (ref 0.0–1.0)

## 2014-11-24 LAB — COMPREHENSIVE METABOLIC PANEL
ALT: 13 U/L — AB (ref 17–63)
AST: 20 U/L (ref 15–41)
Albumin: 4.3 g/dL (ref 3.5–5.0)
Alkaline Phosphatase: 57 U/L (ref 38–126)
Anion gap: 8 (ref 5–15)
BUN: 8 mg/dL (ref 6–20)
CO2: 32 mmol/L (ref 22–32)
CREATININE: 0.63 mg/dL (ref 0.61–1.24)
Calcium: 9.3 mg/dL (ref 8.9–10.3)
Chloride: 96 mmol/L — ABNORMAL LOW (ref 101–111)
GFR calc Af Amer: >60 mL/min (ref >60)
GFR calc non Af Amer: >60 mL/min (ref >60)
GLUCOSE: 90 mg/dL (ref 65–99)
Potassium: 4 mmol/L (ref 3.5–5.1)
Sodium: 136 mmol/L (ref 135–145)
Total Bilirubin: 0.5 mg/dL (ref 0.3–1.2)
Total Protein: 7.5 g/dL (ref 6.5–8.1)

## 2014-11-24 MED ORDER — DIAZEPAM 5 MG PO TABS
5.0000 mg | ORAL_TABLET | Freq: Once | ORAL | Status: AC
  Administered 2014-11-24: 5 mg via ORAL
  Filled 2014-11-24: qty 1

## 2014-11-24 MED ORDER — DIAZEPAM 5 MG PO TABS: 5.0000 mg | ORAL_TABLET | Freq: Once | ORAL | Status: DC

## 2014-11-24 MED ORDER — OXYCODONE-ACETAMINOPHEN 5-325 MG PO TABS
2.0000 | ORAL_TABLET | Freq: Once | ORAL | Status: AC
  Administered 2014-11-24: 2 via ORAL
  Filled 2014-11-24: qty 2

## 2014-11-24 MED ORDER — SODIUM CHLORIDE 0.9 % IV SOLN
1000.0000 mg | Freq: Once | INTRAVENOUS | Status: AC
  Administered 2014-11-24: 1000 mg via INTRAVENOUS
  Filled 2014-11-24: qty 10
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
## 2014-11-24 NOTE — Care Management Note (Signed)
Case Management Note  Patient Details  Name: Ronald MARGESON Sr. MRN: [id number] Date of Birth: 1956-03-14  Subjective/Objective: Patient presents to ED post seizure.                 Action/Plan:  Discussed home health services with patient and patient's family.   Expected Discharge Date:                  Expected Discharge Plan:  Home w Home Health Services  In-House Referral:     Discharge planning Services  CM Consult  Post Acute Care Choice:    Choice offered to:  Patient  DME Arranged:    DME Agency:     HH Arranged:  RN HH Agency:  [facility]  Status of Service:  Completed, signed off  Medicare Important Message Given:    Date Medicare IM Given:    Medicare IM give by:    Date Additional Medicare IM Given:    Additional Medicare Important Message give by:     If discussed at Long Length of Stay Meetings, dates discussed:    Additional Comments: Patient lives at home with his son.  Patient's sister [name] [phone] and daughter [name] [phone] at bedside.  Patient confirms his pcp is Dr. Clarene Duke.  Patient also reports he has a neurologist Dr. Everlena Cooper who is following him for questionable dementia.  Patient's family concerned as patient has lost close to 20 pounds since Feb and 60 pounds in the last year.  [facility] asked patient if he eats?  Patient reports sometimes he cannot remember if he has eaten or not.  Patient's family also concerned patient is not taking his medications correctly.  Patient reports he has a pill box at home.  Patient reports he is able to complete his ADL's without difficulty.  Patient's family wanting homehealth services for medication management.  EDCM explained to patient and his family that Home Health RN will not come out every day.  Patient and patient's family verbalized understanding.  Patient does not qualify for [facility] as he does not have qualifying diagnosis and Dr. Clarene Duke is not a  Participating provider.  EDCM provided patient's  family with list of home health agencies in [facility] of which they have chosen [place].  [facility] informed patient and family that they will be in contact within 24-48 hours.  EDCM also provided patient's family with private duty nursing services and explained it would be an out of pocket expense.  Patient and patient's family thankful for assistance.  Discussed patient with EDPA.  Home health order placed for visiting RN.  No further EDCM needs at this time.  Referral for home health services faxed to [facility] at 2013pm with confirmation of receipt at 2014pm.  Bennie Dallas, [place], RN 11/24/2014, 8:25 PM

## 2014-11-24 NOTE — Discharge Instructions (Signed)
You were evaluated today after a seizure. It is important for you to call your primary care tomorrow in order to reconcile your medications and benzodiazepine use. You're given a prescription for Valium to last until you can speak with your primary care physician. Please follow-up in ED for new or worsening symptoms.  Seizure, Adult A seizure is abnormal electrical activity in the brain. Seizures usually last from 30 seconds to 2 minutes. There are various types of seizures. Before a seizure, you may have a warning sensation (aura) that a seizure is about to occur. An aura may include the following symptoms:   Fear or anxiety.  Nausea.  Feeling like the room is spinning (vertigo).  Vision changes, such as seeing flashing lights or spots. Common symptoms during a seizure include:  A change in attention or behavior (altered mental status).  Convulsions with rhythmic jerking movements.  Drooling.  Rapid eye movements.  Grunting.  Loss of bladder and bowel control.  Bitter taste in the mouth.  Tongue biting. After a seizure, you may feel confused and sleepy. You may also have an injury resulting from convulsions during the seizure. HOME CARE INSTRUCTIONS   If you are given medicines, take them exactly as prescribed by your health care provider.  Keep all follow-up appointments as directed by your health care provider.  Do not swim or drive or engage in risky activity during which a seizure could cause further injury to you or others until your health care provider says it is OK.  Get adequate rest.  Teach friends and family what to do if you have a seizure. They should:  Lay you on the ground to prevent a fall.  Put a cushion under your head.  Loosen any tight clothing around your neck.  Turn you on your side. If vomiting occurs, this helps keep your airway clear.  Stay with you until you recover.  Know whether or not you need emergency care. SEEK IMMEDIATE MEDICAL  CARE IF:  The seizure lasts longer than 5 minutes.  The seizure is severe or you do not wake up immediately after the seizure.  You have an altered mental status after the seizure.  You are having more frequent or worsening seizures. Someone should drive you to the emergency department or call local emergency services (911 in [place] MAKE SURE YOU:  Understand these instructions.  Will watch your condition.  Will get help right away if you are not doing well or get worse. Document Released: 05/26/2000 Document Revised: 03/19/2013 Document Reviewed: 01/08/2013 [facility] Patient Information 2015 [place], [place]. This information is not intended to replace advice given to you by your health care provider. Make sure you discuss any questions you have with your health care provider.

## 2014-11-24 NOTE — ED Provider Notes (Signed)
CSN: [id number]     Arrival date & time 11/24/14  1658 History   First MD Initiated Contact with Patient 11/24/14 1717     Chief Complaint  Patient presents with  . Seizures     (Consider location/radiation/quality/duration/timing/severity/associated sxs/prior Treatment) HPI Ronald SCHULKE Sr. is a 59 y.o. 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 Patient states approximately one week ago he knocked "20 or 30 of mild valuables down the sink by accident". He reports that he did not call his doctor and report the accident. He instead, try to cut his pills into "thumbnail sizes to last me until my next appointment next Wednesday". He reports earlier this afternoon while trying to take a shower, felt a seizure coming on, oriented to his bedroom where there is soft carpet and stood against a door to brace himself. At that point he reports becoming aware of his surroundings and falling forward, which knocked his dentures out. He is unsure feels consciousness, denies any intraoral trauma., Denies loss of bowel or bladder function. Reports immediately taking one of his thumbnail sized Valiums after his seizure. Denies any new discomfort at this time. Reports history of chronic neck and back pain. Denies fevers, chills, difficulty breathing, chest pain, nausea or vomiting, abdominal pain, numbness or weakness. Denies any confusion at this time. Spoke with his daughter who reports patient does not have appropriate medication compliance. Will only intermittently taking his antiseizure medication. She also reports that he will sometimes take too many of his benzodiazepine. She is upset with his PCP for continuing to prescribe benzodiazepine. She has requested home health to ensure her father is taking medications as prescribed. Patient lives at home with son who acts as caregiver.   Past Medical History  Diagnosis Date  . Seizures    Past  Surgical History  Procedure Laterality Date  . Cervical spine surgery      fusion   Family History  Problem Relation Age of Onset  . Cancer Mother     lymphoma  . Cancer Brother     lung    History  Substance Use Topics  . Smoking status: Current Every Day Smoker -- 1.00 packs/day for 26 years    Types: Cigarettes  . Smokeless tobacco: Never Used     Comment: patient is aware he needs to stop   . Alcohol Use: No    Review of Systems A 10 point review of systems was completed and was negative except for pertinent positives and negatives as mentioned in the history of present illness     Allergies  Antihistamines, diphenhydramine-type and Codeine  Home Medications   Prior to Admission medications   Medication Sig Start Date End Date Taking? Authorizing Provider  aspirin EC 81 MG tablet Take 1 tablet (81 mg total) by mouth daily. 07/29/14  Yes Albertine Grates, MD  citalopram (CELEXA) 40 MG tablet Take 40 mg by mouth at bedtime.    Yes Historical Provider, MD  feeding supplement, ENSURE COMPLETE, (ENSURE COMPLETE) LIQD Take 237 mLs by mouth 3 (three) times daily between meals. 07/29/14  Yes Albertine Grates, MD  levETIRAcetam (KEPPRA) 500 MG tablet Take 1 tablet (500 mg total) by mouth 2 (two) times daily. 07/29/14  Yes Albertine Grates, MD  oxyCODONE-acetaminophen (PERCOCET/ROXICET) 5-325 MG per tablet Take 1-1.5 tablets by mouth every 4 (four) hours as needed for severe pain. 08/01/14  Yes Elwin Mocha, MD  cyclobenzaprine (FLEXERIL) 10 MG  tablet Take 1 tablet (10 mg total) by mouth 3 (three) times daily as needed for muscle spasms. Patient not taking: Reported on 11/24/2014 08/01/14   Elwin Mocha, MD  diazepam (VALIUM) 5 MG tablet Take 1 tablet (5 mg total) by mouth once. 11/24/14   Joycie Peek, PA-C  nicotine (NICODERM CQ - DOSED IN MG/24 HOURS) 14 mg/24hr patch Place 1 patch (14 mg total) onto the skin daily. Patient not taking: Reported on 11/24/2014 07/29/14   Albertine Grates, MD   BP 112/98 mmHg  Pulse  67  Temp(Src) 98.4 F (36.9 C) (Oral)  Resp 14  SpO2 98% Physical Exam  Constitutional: He is oriented to person, place, and time. He appears well-developed and well-nourished. No distress.  Frail-appearing  HENT:  Head: Normocephalic and atraumatic.  Mouth/Throat: Oropharynx is clear and moist. No oropharyngeal exudate.  No obvious intraoral trauma.  Eyes: Conjunctivae are normal. Pupils are equal, round, and reactive to light. Right eye exhibits no discharge. Left eye exhibits no discharge. No scleral icterus.  Neck: Neck supple.  Cardiovascular: Normal rate, regular rhythm and normal heart sounds.   Pulmonary/Chest: Effort normal and breath sounds normal. No respiratory distress. He has no wheezes. He has no rales.  Abdominal: Soft. There is no tenderness.  Musculoskeletal: He exhibits no tenderness.  Neurological: He is alert and oriented to person, place, and time.  Cranial Nerves II-XII grossly intact. Moves all extremities without ataxia. Patient is alert to person place and situation. No evidence of postictal state. No post seizure sequelae. No obvious tremor noted  Skin: Skin is warm and dry. No rash noted. He is not diaphoretic.  Psychiatric: He has a normal mood and affect.  Nursing note and vitals reviewed.   ED Course  Procedures (including critical care time) Labs Review Labs Reviewed  COMPREHENSIVE METABOLIC PANEL - Abnormal; Notable for the following:    Chloride 96 (*)    ALT 13 (*)    All other components within normal limits  CBC WITH DIFFERENTIAL/PLATELET  URINALYSIS, ROUTINE W REFLEX MICROSCOPIC (NOT AT [facility])  CBG MONITORING, ED    Imaging Review No results found.   EKG Interpretation None     Meds given in ED:  Medications  oxyCODONE-acetaminophen (PERCOCET/ROXICET) 5-325 MG per tablet 2 tablet (not administered)  levETIRAcetam (KEPPRA) 1,000 mg in sodium chloride 0.9 % 100 mL IVPB (0 mg Intravenous Stopped 11/24/14 1859)  diazepam (VALIUM)  tablet 5 mg (5 mg Oral Given 11/24/14 1830)    New Prescriptions   DIAZEPAM (VALIUM) 5 MG TABLET    Take 1 tablet (5 mg total) by mouth once.   Filed Vitals:   11/24/14 1715 11/24/14 1845  BP: 130/83 112/98  Pulse: 67 67  Temp: 98.4 F (36.9 C)   TempSrc: Oral   Resp: 16 14  SpO2: 96% 98%    MDM  Vitals stable - WNL -afebrile Pt resting comfortably in ED. PE--normal neurological exam. No tremor. No evidence of postictal state or status epilepticus. Labwork-labs baseline essentially noncontributory.  Consultation case management to help with home health needs. 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 Patient giving a loading dose of Keppra in the ED of 1000 mg. Will be given benzodiazepine for 1 day and instructions to follow-up with primary care tomorrow for medication management related to seizures. Doubt any emergent cause for seizure activity today.  I discussed all relevant lab  findings and imaging results with pt and they verbalized understanding. Discussed f/u with PCP within 48 hrs and return precautions, pt very amenable to plan. Prior to patient discharge, I discussed and reviewed this case with Dr.Knapp   Final diagnoses:  Seizures       Joycie Peek, PA-C 11/24/14 1917  Linwood Dibbles, MD 11/26/14 2300

## 2014-11-24 NOTE — ED Notes (Signed)
Bed: NO03 Expected date:  Expected time:  Means of arrival:  Comments: EMS- 59yo M, neck/back pain after seizure

## 2014-11-24 NOTE — ED Notes (Signed)
Pt in by ems. Hx of seizures. On diazepam for this but has breakthrough seizures when he runs low or out of them. Sts he knew he was about to have a seizure today but could not get to a safe place quickly enough and fell backwards landing on his buttocks. Felt a jolt up his spine, then seized. Did not call ems after seizure. C/o neck and back pain. C-collar in place by ems.

## 2014-11-25 ENCOUNTER — Emergency Department
Admission: EM | Admit: 2014-11-25 | Discharge: 2014-11-25 | Disposition: A | Discharge status: Home / Self Care | Payer: Medicare Other | Attending: Emergency Medicine | Admitting: Emergency Medicine

## 2014-11-25 ENCOUNTER — Other Ambulatory Visit: Payer: Self-pay

## 2014-11-25 ENCOUNTER — Encounter: Payer: Self-pay | Admitting: Emergency Medicine

## 2014-11-25 DIAGNOSIS — Z720 Tobacco use: Secondary | ICD-10-CM | POA: Diagnosis not present

## 2014-11-25 DIAGNOSIS — Z760 Encounter for issue of repeat prescription: Secondary | ICD-10-CM | POA: Diagnosis not present

## 2014-11-25 DIAGNOSIS — M549 Dorsalgia, unspecified: Secondary | ICD-10-CM | POA: Diagnosis present

## 2014-11-25 DIAGNOSIS — Z79899 Other long term (current) drug therapy: Secondary | ICD-10-CM | POA: Insufficient documentation

## 2014-11-25 DIAGNOSIS — Z7982 Long term (current) use of aspirin: Secondary | ICD-10-CM | POA: Insufficient documentation

## 2014-11-25 MED ORDER — DIAZEPAM 5 MG PO TABS
ORAL_TABLET | ORAL | Status: AC
  Filled 2014-11-25: qty 1

## 2014-11-25 MED ORDER — OXYCODONE-ACETAMINOPHEN 5-325 MG PO TABS
1.0000 | ORAL_TABLET | Freq: Once | ORAL | Status: AC
  Administered 2014-11-25: 1 via ORAL

## 2014-11-25 MED ORDER — OXYCODONE-ACETAMINOPHEN 5-325 MG PO TABS
ORAL_TABLET | ORAL | Status: AC
  Administered 2014-11-25: 1 via ORAL
  Filled 2014-11-25: qty 1

## 2014-11-25 MED ORDER — DIAZEPAM 5 MG PO TABS
5.0000 mg | ORAL_TABLET | Freq: Once | ORAL | Status: AC
  Administered 2014-11-25: 5 mg via ORAL

## 2014-11-25 NOTE — ED Notes (Signed)
EKG done at 1437 due to high pulse rate of 124. EKG 80bpm

## 2014-11-25 NOTE — ED Notes (Addendum)
Pt reports he knocked his prescription of Valium  And Percocet's into the sink States he need the prescriptions refilled until he can see his doctor in about 2 weeks. Pt states he was seen at [facility] for seizures yesterday. Pt states he is still taking his Keppra for seizures and has those medications .

## 2014-11-25 NOTE — ED Notes (Signed)
Pt reports having appointment with PCP next Wednesday. Reports calling PCP requesting medication to prevent seizures until then but not getting answers.

## 2014-11-25 NOTE — ED Provider Notes (Signed)
[facility] Emergency Department Provider Note  ____________________________________________  Time seen: Approximately 4:55 PM  I have reviewed the triage vital signs and the nursing notes.   HISTORY  Chief Complaint Back Pain    HPI Ronald JACO Sr. is a 59 y.o. male requested a refill of Percocet and Valium until he can see his doctor in about 2 weeks. Patient states secondary to seizure activity he knocked his prescription of Percocet as Valium to the sink patient state he was seen at [facility] yesterday secondary to  having a seizure. Patient was given a one-day supply of medication told to follow up with his family doctor. Patient stated he call his family doctor and was told his prescription would probably not be ready until tomorrow as an emergency refill. Patient is now asking for enough medication to last through the night.   Past Medical History  Diagnosis Date  . Seizures     Patient Active Problem List   Diagnosis Date Noted  . Epileptic seizures 07/28/2014  . Tobacco abuse 07/28/2014  . Protein calorie malnutrition 07/28/2014  . Chest pain 07/28/2014  . Epileptic seizure, generalized 07/28/2014  . Cognitive impairment 01/08/2014  . Essential tremor 01/08/2014    Past Surgical History  Procedure Laterality Date  . Cervical spine surgery      fusion    Current Outpatient Rx  Name  Route  Sig  Dispense  Refill  . aspirin EC 81 MG tablet   Oral   Take 1 tablet (81 mg total) by mouth daily.   30 tablet   0   . citalopram (CELEXA) 40 MG tablet   Oral   Take 40 mg by mouth at bedtime.          . cyclobenzaprine (FLEXERIL) 10 MG tablet   Oral   Take 1 tablet (10 mg total) by mouth 3 (three) times daily as needed for muscle spasms. Patient not taking: Reported on 11/24/2014   30 tablet   0   . diazepam (VALIUM) 5 MG tablet   Oral   Take 1 tablet (5 mg total) by mouth once.   5 tablet   0   . feeding supplement, ENSURE  COMPLETE, (ENSURE COMPLETE) LIQD   Oral   Take 237 mLs by mouth 3 (three) times daily between meals.   30 Bottle   3   . levETIRAcetam (KEPPRA) 500 MG tablet   Oral   Take 1 tablet (500 mg total) by mouth 2 (two) times daily.   60 tablet   0   . nicotine (NICODERM CQ - DOSED IN MG/24 HOURS) 14 mg/24hr patch   Transdermal   Place 1 patch (14 mg total) onto the skin daily. Patient not taking: Reported on 11/24/2014   28 patch   0   . oxyCODONE-acetaminophen (PERCOCET/ROXICET) 5-325 MG per tablet   Oral   Take 1-1.5 tablets by mouth every 4 (four) hours as needed for severe pain.   30 tablet   0     Allergies Antihistamines, diphenhydramine-type and Codeine  Family History  Problem Relation Age of Onset  . Cancer Mother     lymphoma  . Cancer Brother     lung     Social History History  Substance Use Topics  . Smoking status: Current Every Day Smoker -- 1.00 packs/day for 26 years    Types: Cigarettes  . Smokeless tobacco: Never Used     Comment: patient is aware he needs to stop   .  Alcohol Use: No    Review of Systems Constitutional: No fever/chills Eyes: No visual changes. ENT: No sore throat. Cardiovascular: Denies chest pain. Respiratory: Denies shortness of breath. Gastrointestinal: No abdominal pain.  No nausea, no vomiting.  No diarrhea.  No constipation. Genitourinary: Negative for dysuria. Musculoskeletal: Negative for back pain. Skin: Negative for rash. Neurological: Negative for headaches, focal weakness or numbness. Psychiatric:Denies any confusion at this time. Endocrine: Hematological/Lymphatic: Allergic/Immunilogical: see medication list 10-point ROS otherwise negative.  ____________________________________________   PHYSICAL EXAM:  VITAL SIGNS: ED Triage Vitals  Enc Vitals Group     BP 11/25/14 1428 116/83 mmHg     Pulse Rate 11/25/14 1428 124     Resp 11/25/14 1428 22     Temp 11/25/14 1428 98.2 F (36.8 C)     Temp Source  11/25/14 1428 Oral     SpO2 11/25/14 1428 93 %     Weight 11/25/14 1428 115 lb (52.164 kg)     Height 11/25/14 1428 6' (1.829 m)     Head Cir --      Peak Flow --      Pain Score 11/25/14 1428 10     Pain Loc --      Pain Edu? --      Excl. in GC? --     Constitutional: Alert and oriented. Well appearing and in no acute distress. Eyes: Conjunctivae are normal. PERRL. EOMI. Head: Atraumatic. Nose: No congestion/rhinnorhea. Mouth/Throat: Mucous membranes are moist.  Oropharynx non-erythematous. Neck: No stridor. No deformity for nuchal range of motion nontender palpation. Hematological/Lymphatic/Immunilogical: No cervical lymphadenopathy. Cardiovascular: Normal rate, regular rhythm. Grossly normal heart sounds.  Good peripheral circulation. Respiratory: Normal respiratory effort.  No retractions. Lungs CTAB. Gastrointestinal: Soft and nontender. No distention. No abdominal bruits. No CVA tenderness. Musculoskeletal: No lower extremity tenderness nor edema.  No joint effusions. Neurologic:  Normal speech and language. No gross focal neurologic deficits are appreciated. Speech is normal. No gait instability. Cranial nerves II through XII grossly intact. No post seizure sequela. Skin:  Skin is warm, dry and intact. No rash noted. Psychiatric: Mood and affect are normal. Speech and behavior are normal.  ____________________________________________   LABS (all labs ordered are listed, but only abnormal results are displayed)  Labs Reviewed - No data to display ____________________________________________  EKG  ____________________________________________  RADIOLOGY   ____________________________________________   PROCEDURES  Procedure(s) performed: None  Critical Care performed: No  ____________________________________________   INITIAL IMPRESSION / ASSESSMENT AND PLAN / ED COURSE  Pertinent labs & imaging results that were available during my care of the patient were  reviewed by me and considered in my medical decision making (see chart for details).  Meds medication refill will be given in the ER tonight only. Patient advised to follow with PCP for continual medication prescriptions. ____________________________________________   FINAL CLINICAL IMPRESSION(S) / ED DIAGNOSES  Final diagnoses:  Encounter for medication refill      Joni Reining, PA-C 11/25/14 1704  Maurilio Lovely, MD 11/26/14 [phone]

## 2014-11-25 NOTE — Discharge Instructions (Signed)
Medication Refill, Emergency Department °We have refilled your medication today as a courtesy to you. It is best for your medical care, however, to take care of getting refills done through your primary caregiver's office. They have your records and can do a better job of follow-up than we can in the emergency department. °On maintenance medications, we often only prescribe enough medications to get you by until you are able to see your regular caregiver. This is a more expensive way to refill medications. °In the future, please plan for refills so that you will not have to use the emergency department for this. °Thank you for your help. Your help allows us to better take care of the daily emergencies that enter our department. °Document Released: 09/15/2003 Document Revised: 08/21/2011 Document Reviewed: 09/05/2013 °[employer]® Patient Information ©2015 [employer]. This information is not intended to replace advice given to you by your health care provider. Make sure you discuss any questions you have with your health care provider. ° °

## 2014-11-28 ENCOUNTER — Emergency Department (HOSPITAL_COMMUNITY)
Admission: EM | Admit: 2014-11-28 | Discharge: 2014-11-28 | Disposition: A | Discharge status: Home / Self Care | Payer: Medicare Other | Attending: Emergency Medicine | Admitting: Emergency Medicine

## 2014-11-28 ENCOUNTER — Encounter (HOSPITAL_COMMUNITY): Payer: Self-pay | Admitting: Emergency Medicine

## 2014-11-28 ENCOUNTER — Emergency Department (HOSPITAL_COMMUNITY): Payer: Medicare Other

## 2014-11-28 DIAGNOSIS — Z720 Tobacco use: Secondary | ICD-10-CM | POA: Diagnosis not present

## 2014-11-28 DIAGNOSIS — Z7982 Long term (current) use of aspirin: Secondary | ICD-10-CM | POA: Diagnosis not present

## 2014-11-28 DIAGNOSIS — G8929 Other chronic pain: Secondary | ICD-10-CM | POA: Insufficient documentation

## 2014-11-28 DIAGNOSIS — Z79899 Other long term (current) drug therapy: Secondary | ICD-10-CM | POA: Insufficient documentation

## 2014-11-28 DIAGNOSIS — J449 Chronic obstructive pulmonary disease, unspecified: Secondary | ICD-10-CM | POA: Insufficient documentation

## 2014-11-28 DIAGNOSIS — R7981 Abnormal blood-gas level: Secondary | ICD-10-CM | POA: Diagnosis not present

## 2014-11-28 DIAGNOSIS — R0689 Other abnormalities of breathing: Secondary | ICD-10-CM

## 2014-11-28 DIAGNOSIS — I959 Hypotension, unspecified: Secondary | ICD-10-CM

## 2014-11-28 DIAGNOSIS — R636 Underweight: Secondary | ICD-10-CM | POA: Insufficient documentation

## 2014-11-28 DIAGNOSIS — G40909 Epilepsy, unspecified, not intractable, without status epilepticus: Secondary | ICD-10-CM | POA: Diagnosis not present

## 2014-11-28 LAB — COMPREHENSIVE METABOLIC PANEL
ALBUMIN: 3.9 g/dL (ref 3.5–5.0)
ALK PHOS: 57 U/L (ref 38–126)
ALT: 14 U/L — ABNORMAL LOW (ref 17–63)
AST: 26 U/L (ref 15–41)
Anion gap: 5 (ref 5–15)
BILIRUBIN TOTAL: 0.8 mg/dL (ref 0.3–1.2)
BUN: 14 mg/dL (ref 6–20)
CO2: 33 mmol/L — ABNORMAL HIGH (ref 22–32)
Calcium: 8.9 mg/dL (ref 8.9–10.3)
Chloride: 97 mmol/L — ABNORMAL LOW (ref 101–111)
Creatinine, Ser: 0.65 mg/dL (ref 0.61–1.24)
GFR calc non Af Amer: >60 mL/min (ref >60)
Glucose, Bld: 94 mg/dL (ref 65–99)
POTASSIUM: 4.9 mmol/L (ref 3.5–5.1)
Sodium: 135 mmol/L (ref 135–145)
Total Protein: 6.8 g/dL (ref 6.5–8.1)

## 2014-11-28 LAB — PROTIME-INR
INR: 1.11 (ref 0.00–1.49)
Prothrombin Time: 14.5 seconds (ref 11.6–15.2)

## 2014-11-28 LAB — CBC WITH DIFFERENTIAL/PLATELET
BASOS ABS: 0 10*3/uL (ref 0.0–0.1)
Basophils Relative: 0 % (ref 0–1)
Eosinophils Absolute: 0.1 10*3/uL (ref 0.0–0.7)
Eosinophils Relative: 1 % (ref 0–5)
HCT: 41.6 % (ref 39.0–52.0)
HEMOGLOBIN: 13.3 g/dL (ref 13.0–17.0)
Lymphocytes Relative: 17 % (ref 12–46)
Lymphs Abs: 1.3 10*3/uL (ref 0.7–4.0)
MCH: 32 pg (ref 26.0–34.0)
MCHC: 32 g/dL (ref 30.0–36.0)
MCV: 100 fL (ref 78.0–100.0)
MONO ABS: 0.4 10*3/uL (ref 0.1–1.0)
MONOS PCT: 4 % (ref 3–12)
NEUTROS ABS: 6.2 10*3/uL (ref 1.7–7.7)
Neutrophils Relative %: 78 % — ABNORMAL HIGH (ref 43–77)
Platelets: 201 10*3/uL (ref 150–400)
RBC: 4.16 MIL/uL — ABNORMAL LOW (ref 4.22–5.81)
RDW: 12.8 % (ref 11.5–15.5)
WBC: 8 10*3/uL (ref 4.0–10.5)

## 2014-11-28 LAB — BLOOD GAS, ARTERIAL
Acid-Base Excess: 5.5 mmol/L — ABNORMAL HIGH (ref 0.0–2.0)
Bicarbonate: 30.8 mEq/L — ABNORMAL HIGH (ref 20.0–24.0)
Drawn by: 27021
FIO2: 0.21 %
O2 SAT: 88.1 %
PATIENT TEMPERATURE: 99.2
PH ART: 7.401 (ref 7.350–7.450)
TCO2: 27.6 mmol/L (ref 0–100)
pCO2 arterial: 50.9 mmHg — ABNORMAL HIGH (ref 35.0–45.0)
pO2, Arterial: 52.7 mmHg — ABNORMAL LOW (ref 80.0–100.0)

## 2014-11-28 LAB — APTT: APTT: 30 s (ref 24–37)

## 2014-11-28 LAB — I-STAT CG4 LACTIC ACID, ED: Lactic Acid, Venous: 1.35 mmol/L (ref 0.5–2.0)

## 2014-11-28 MED ORDER — SODIUM CHLORIDE 0.9 % IV SOLN: 1000.0000 mL | INTRAVENOUS | Status: DC

## 2014-11-28 MED ORDER — SODIUM CHLORIDE 0.9 % IV SOLN: 1000.0000 mL | Freq: Once | INTRAVENOUS | Status: DC

## 2014-11-28 MED ORDER — SODIUM CHLORIDE 0.9 % IV SOLN
1000.0000 mL | INTRAVENOUS | Status: DC
  Administered 2014-11-28: 1000 mL via INTRAVENOUS

## 2014-11-28 NOTE — ED Notes (Signed)
Pt's home care RN called EMS to report hypotension. Pt states he has a hx of backpain and takes Percocet Q4h and Valium. Has had several doses this morning, pt drowsy upon EMS arrival and became alert after 2 doses of Narcan. Pt's BP on arrival to ED 117/91.

## 2014-11-28 NOTE — ED Notes (Signed)
Bed: FS23 Expected date:  Expected time:  Means of arrival:  Comments: lethargic

## 2014-11-28 NOTE — ED Notes (Signed)
RT at bedside for ABG

## 2014-11-28 NOTE — ED Notes (Signed)
Home health consult at bedside speaking with pt and pt's family members.

## 2014-11-28 NOTE — ED Notes (Signed)
MD at bedside. Dr. Knapp at bedside.  

## 2014-11-28 NOTE — Discharge Instructions (Signed)

## 2014-11-28 NOTE — ED Notes (Signed)
MD at bedside. 

## 2014-11-28 NOTE — Care Management Note (Signed)
Case Management Note  Patient Details  Name: Ronald CASTEEN Sr. MRN: [id number] Date of Birth: February 28, 1956  Subjective/Objective:       COPD             Action/Plan: Home Health  Expected Discharge Date:    11/28/2014              Expected Discharge Plan:  Home w Home Health Services  In-House Referral:     Discharge planning Services  CM Consult  Post Acute Care Choice:  Resumption of Svcs/PTA Provider, Home Health Choice offered to:     DME Arranged:  Oxygen DME Agency:  [facility]  HH Arranged:    HH Agency:     Status of Service:  Completed, signed off  Medicare Important Message Given:  No Date Medicare IM Given:    Medicare IM give by:    Date Additional Medicare IM Given:    Additional Medicare Important Message give by:     If discussed at Long Length of Stay Meetings, dates discussed:    Additional Comments: Consulted for Home Health and DME. NCM spoke to pt in room. [name], DME rep for [facility] [phone] fax, #[phone] for oxygen for home. Faxed orders for oxygen, overnight pulse oximetry, facesheet and H&P. [facility] will arrange pt to have overnight pulse oximetry and set up oxygen at home. Contacted [facility] to resume HH. Provided contact number for [facility] of dc instructions for home.  Elliot Cousin, RN 11/28/2014, 5:24 PM

## 2014-11-28 NOTE — ED Provider Notes (Signed)
CSN: [id number]     Arrival date & time 11/28/14  1354 History   First MD Initiated Contact with Patient 11/28/14 1435     Chief Complaint  Patient presents with  . Hypotension    HPI Pt was getting checked out by home health today. At the house, the home health nurse checked a blood pressure and noticed that his blood pressure was low so he was sent to the ED.  EMS arrived. The noticed that the patient was drowsy.  He was given 2 dose of narcan.. Drowsiness improved and his blood pressure on arrival to the ED was 117/91.  Pt has a history of medication overuse.  He lives with his son but is often home alone. Has chronic pain issues and is chronically taking benzodiazepine's. He has a history of seizures often associated with benzodiazepine withdrawal. Patient has been instructed in the past to taper offices benzodiazepine's. Unfortunately he has had issues with possibly taking his medications inappropriately. He was actually in the emergency room and this past week asking for refill of his medications because he accidentally losing some benzodiazepine's.  Family states that he has been losing weight. He has not been eating appropriately. The symptoms have been ongoing for several months to years.  They are concerned about his ability to care for himself.  Pt states that he is feeling fine.  He did not want to come to the ED.  He has no complaints at this time. Past Medical History  Diagnosis Date  . Seizures   . Back pain    Past Surgical History  Procedure Laterality Date  . Cervical spine surgery      fusion   Family History  Problem Relation Age of Onset  . Cancer Mother     lymphoma  . Cancer Brother     lung    History  Substance Use Topics  . Smoking status: Current Every Day Smoker -- 1.00 packs/day for 26 years    Types: Cigarettes  . Smokeless tobacco: Never Used     Comment: patient is aware he needs to stop   . Alcohol Use: No    Review of Systems  All other systems  reviewed and are negative.     Allergies  Antihistamines, diphenhydramine-type and Codeine  Home Medications   Prior to Admission medications   Medication Sig Start Date End Date Taking? Authorizing Provider  aspirin EC 81 MG tablet Take 1 tablet (81 mg total) by mouth daily. 07/29/14  Yes Albertine Grates, MD  citalopram (CELEXA) 40 MG tablet Take 40 mg by mouth at bedtime.    Yes Historical Provider, MD  diazepam (VALIUM) 5 MG tablet Take 1 tablet (5 mg total) by mouth once. 11/24/14  Yes Joycie Peek, PA-C  feeding supplement, ENSURE COMPLETE, (ENSURE COMPLETE) LIQD Take 237 mLs by mouth 3 (three) times daily between meals. 07/29/14  Yes Albertine Grates, MD  levETIRAcetam (KEPPRA) 500 MG tablet Take 1 tablet (500 mg total) by mouth 2 (two) times daily. 07/29/14  Yes Albertine Grates, MD  oxyCODONE-acetaminophen (PERCOCET/ROXICET) 5-325 MG per tablet Take 1-1.5 tablets by mouth every 4 (four) hours as needed for severe pain. 08/01/14  Yes Elwin Mocha, MD  cyclobenzaprine (FLEXERIL) 10 MG tablet Take 1 tablet (10 mg total) by mouth 3 (three) times daily as needed for muscle spasms. Patient not taking: Reported on 11/24/2014 08/01/14   Elwin Mocha, MD  nicotine (NICODERM CQ - DOSED IN MG/24 HOURS) 14 mg/24hr patch Place 1 patch (  14 mg total) onto the skin daily. Patient not taking: Reported on 11/24/2014 07/29/14   Albertine Grates, MD   BP 104/67 mmHg  Pulse 68  Temp(Src) 98.5 F (36.9 C) (Oral)  Resp 20  SpO2 93% Physical Exam  Constitutional: He is oriented to person, place, and time. No distress.  Underweight   HENT:  Head: Normocephalic and atraumatic.  Right Ear: External ear normal.  Left Ear: External ear normal.  Eyes: Conjunctivae are normal. Right eye exhibits no discharge. Left eye exhibits no discharge. No scleral icterus.  Neck: Neck supple. No tracheal deviation present.  Cardiovascular: Normal rate, regular rhythm and intact distal pulses.   Pulmonary/Chest: Effort normal and breath sounds normal.  No stridor. No respiratory distress. He has no wheezes. He has no rales.  occsnl cough  Abdominal: Soft. Bowel sounds are normal. He exhibits no distension. There is no tenderness. There is no rebound and no guarding.  Musculoskeletal: He exhibits no edema or tenderness.  Neurological: He is alert and oriented to person, place, and time. He has normal strength. No cranial nerve deficit (no facial droop, extraocular movements intact, no slurred speech) or sensory deficit. He exhibits normal muscle tone. He displays no seizure activity. Coordination normal.  Skin: Skin is warm and dry. No rash noted. He is not diaphoretic.  Psychiatric: He has a normal mood and affect.  Nursing note and vitals reviewed.   ED Course  Procedures (including critical care time) Labs Review Labs Reviewed  COMPREHENSIVE METABOLIC PANEL - Abnormal; Notable for the following:    Chloride 97 (*)    CO2 33 (*)    ALT 14 (*)    All other components within normal limits  CBC WITH DIFFERENTIAL/PLATELET - Abnormal; Notable for the following:    RBC 4.16 (*)    Neutrophils Relative % 78 (*)    All other components within normal limits  BLOOD GAS, ARTERIAL - Abnormal; Notable for the following:    pCO2 arterial 50.9 (*)    pO2, Arterial 52.7 (*)    Bicarbonate 30.8 (*)    Acid-Base Excess 5.5 (*)    All other components within normal limits  APTT  PROTIME-INR  I-STAT CG4 LACTIC ACID, ED    Imaging Review Dg Chest Port 1 View  11/28/2014   CLINICAL DATA:  Shortness of breath  EXAM: PORTABLE CHEST - 1 VIEW  COMPARISON:  07/28/2014  FINDINGS: The heart size and mediastinal contours are within normal limits. Both lungs are clear. The visualized skeletal structures are unremarkable. Cervical fusion hardware is noted. Lungs are mildly hyperinflated which may be seen with emphysema.  IMPRESSION: No active disease.   Electronically Signed   By: Christiana Pellant M.D.   On: 11/28/2014 16:09     EKG  Interpretation   Date/Time:  Saturday November 28 2014 15:23:59 EDT Ventricular Rate:  69 PR Interval:  168 QRS Duration: 93 QT Interval:  397 QTC Calculation: 425 R Axis:   -89 Text Interpretation:  Sinus rhythm Markedly posterior QRS axis No  significant change since last tracing Confirmed by Daleah Coulson  MD-J, Annalia Metzger  ([id number]) on 11/28/2014 3:31:19 PM      MDM   Final diagnoses:  Chronic obstructive pulmonary disease, unspecified COPD, unspecified chronic bronchitis type  Hypercarbia   Pt has returned to baseline.  He is alert and oriented.  Blood pressure is normal here.  Pt has been able to get up walk as well as eat.  Discsused his chronic issues with his  family.  His opiates and benzos are likely contributing to his symptoms.  Pt also has copd and smokes.  I think he is a candidate for home o2 which may help with his drowsiness.  Home o2 has been ordered.  Discussed precautions with patient and family.  Pt is anxious to go home.   Linwood Dibbles, MD 11/28/14 [phone]

## 2014-12-01 ENCOUNTER — Encounter: Payer: Self-pay | Admitting: Radiology

## 2014-12-01 ENCOUNTER — Ambulatory Visit (INDEPENDENT_AMBULATORY_CARE_PROVIDER_SITE_OTHER): Payer: Medicare Other | Admitting: Family Medicine

## 2014-12-01 ENCOUNTER — Encounter: Payer: Self-pay | Admitting: Family Medicine

## 2014-12-01 VITALS — BP 148/80 | HR 74 | Temp 98.5°F | Ht 71.0 in | Wt 115.0 lb

## 2014-12-01 DIAGNOSIS — Z720 Tobacco use: Secondary | ICD-10-CM

## 2014-12-01 DIAGNOSIS — G40909 Epilepsy, unspecified, not intractable, without status epilepticus: Secondary | ICD-10-CM

## 2014-12-01 DIAGNOSIS — F1010 Alcohol abuse, uncomplicated: Secondary | ICD-10-CM

## 2014-12-01 DIAGNOSIS — F1011 Alcohol abuse, in remission: Secondary | ICD-10-CM | POA: Insufficient documentation

## 2014-12-01 DIAGNOSIS — G8929 Other chronic pain: Secondary | ICD-10-CM

## 2014-12-01 DIAGNOSIS — R64 Cachexia: Secondary | ICD-10-CM | POA: Diagnosis not present

## 2014-12-01 DIAGNOSIS — R4189 Other symptoms and signs involving cognitive functions and awareness: Secondary | ICD-10-CM

## 2014-12-01 DIAGNOSIS — M549 Dorsalgia, unspecified: Secondary | ICD-10-CM

## 2014-12-01 DIAGNOSIS — G250 Essential tremor: Secondary | ICD-10-CM

## 2014-12-01 DIAGNOSIS — F32A Depression, unspecified: Secondary | ICD-10-CM

## 2014-12-01 DIAGNOSIS — F329 Major depressive disorder, single episode, unspecified: Secondary | ICD-10-CM | POA: Insufficient documentation

## 2014-12-01 MED ORDER — CITALOPRAM HYDROBROMIDE 40 MG PO TABS: 40.0000 mg | ORAL_TABLET | Freq: Every day | ORAL | Status: DC

## 2014-12-01 MED ORDER — LEVETIRACETAM 500 MG PO TABS
500.0000 mg | ORAL_TABLET | Freq: Two times a day (BID) | ORAL | Status: DC
Start: 2014-12-01 — End: 2014-12-09

## 2014-12-01 MED ORDER — DIAZEPAM 5 MG PO TABS: 5.0000 mg | ORAL_TABLET | Freq: Three times a day (TID) | ORAL | Status: DC

## 2014-12-01 NOTE — Assessment & Plan Note (Addendum)
Likely from too rapid benzo withdrawal. Discussed recommendation and all reasons for continued slowed benzo taper - and will prescribe 5mg  TID for next 2 weeks then return to clinic to continue benzo taper. Also recommended f/u with neurology. Pt agrees. UDS and controlled substance agreement today

## 2014-12-01 NOTE — Assessment & Plan Note (Signed)
Encouraged daily ensure as well as regular meals. Continue to monitor. Recent alb 3.9.

## 2014-12-01 NOTE — Patient Instructions (Addendum)
Return in 2 weeks for follow up visit. I have provided 2 weeks of valium 5mg  three times daily. Have [facility] nurse help you set up pill box for the week. Continue keppra 500mg  twice daily (refilled today) Start taking tylenol 500-1000mg  three times daily. Continue celexa 40mg  daily.  Call me with any questions. Urine drug screen and controlled substance agreement form today.

## 2014-12-01 NOTE — Assessment & Plan Note (Signed)
Has been on percocets 5/325mg  chronically, endorses he had #120 filled last Thursday and has lost this script. Advised to take tylenol 500-1000mg  TID scheduled for next month until ne is next due for refill. UDS and controlled substance agreement today

## 2014-12-01 NOTE — Assessment & Plan Note (Signed)
Was on valium for this. Will slowly taper off. Pt interested in trial of primidone.

## 2014-12-01 NOTE — Progress Notes (Signed)
BP 148/80 mmHg  Pulse 74  Temp(Src) 98.5 F (36.9 C) (Tympanic)  Ht 5\' 11"  (1.803 m)  Wt 115 lb (52.164 kg)  BMI 16.05 kg/m2  SpO2 93%   CC: new pt to establish care  Subjective:    Patient ID: Ronald Marcus Sr., male    DOB: [birth year], 59 y.o.   MRN: [id number]  HPI: Ronald CONGO Sr. is a 59 y.o. male presenting on 12/01/2014 for Establish Care   Prior saw Dr Clarene Duke. Presents with sister today who brings up several concerns outside of room - weight loss, confusion with medications, worried he is unable to care for himself at home. He lives with son who is not frequently at home. Daughter and sister bring pt food but they don't think he's eating well. Reviewing records, several ER visits (11/24/2014, 11/25/2014 and 11/28/2014) for seizure, hypotension and med refill. Last he requested 2 wk refill of percocets and valium from the ER 6/15 due to accidentally spilling meds into sink. He had been advised to slow taper off benzos in the past and has history of medication overuse. Last hospitalization 07/2014 for seizure after benzo withdrawal. He never followed up as directed with Dr Adriana Mccallum, last seen 12/2013.   He is chronically on benzodiazepine, opiate, and has ? history of COPD and continues to smoke. Latest, home O2 was ordered and Genevieve Norlander came out to house over weekend to set up care.  Endorses he's on 10mg  valium three times daily although his list has 5mg  dose listed. He's also on oxycodone 5/325mg  QID scheduled. Endorses he agrees with coming off valium.   He had #90 valium and #120 percocets filled on Thursday by [facility].   Malnutrition with weight loss - endorses regular with 1 ensure daily.   Smoker - down to 10 cig/day, prior 2-3 ppd.   Depression - on citalopram  On disability for lower back and neck pain (2008).  Preventative: No recent CPE.  No h/o colon cancer screening.   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 Refinishes furniture  Relevant past medical, surgical, family and social history reviewed and updated as indicated. Interim medical history since our last visit reviewed. Allergies and medications reviewed and updated. Current Outpatient Prescriptions on File Prior to Visit  Medication Sig  . aspirin EC 81 MG tablet Take 1 tablet (81 mg total) by mouth daily.  . feeding supplement, ENSURE COMPLETE, (ENSURE COMPLETE) LIQD Take 237 mLs by mouth 3 (three) times daily between meals.  Marland Kitchen oxyCODONE-acetaminophen (PERCOCET/ROXICET) 5-325 MG per tablet Take 1-1.5 tablets by mouth every 4 (four) hours as needed for severe pain.   No current facility-administered medications on file prior to visit.    Review of Systems Per HPI unless specifically indicated above     Objective:    BP 148/80 mmHg  Pulse 74  Temp(Src) 98.5 F (36.9 C) (Tympanic)  Ht 5\' 11"  (1.803 m)  Wt 115 lb (52.164 kg)  BMI 16.05 kg/m2  SpO2 93%  Wt Readings from Last 3 Encounters:  12/01/14 115 lb (52.164 kg)  11/25/14 115 lb (52.164 kg)  07/29/14 123 lb 14.4 oz (56.201 kg)    Physical Exam  Constitutional: He appears well-developed and well-nourished. No distress.  Looks much older than stated age  HENT:  Head: Normocephalic and atraumatic.  Mouth/Throat: Oropharynx is clear and moist. No oropharyngeal exudate.  Eyes: Conjunctivae and EOM  are normal. Pupils are equal, round, and reactive to light.  Neck: Normal range of motion. Neck supple. No thyromegaly present.  Cardiovascular: Normal rate, regular rhythm, normal heart sounds and intact distal pulses.   No murmur heard. Pulmonary/Chest: Effort normal and breath sounds normal. No respiratory distress. He has no wheezes. He has no rales.  Musculoskeletal: He exhibits no edema.  Lymphadenopathy:    He has no cervical adenopathy.  Neurological:  slowed gait  Skin: Skin is warm and dry. No rash noted.    Psychiatric: He has a normal mood and affect.  Somewhat slowed responses  Nursing note and vitals reviewed.  Results for orders placed or performed during the hospital encounter of 11/28/14  APTT  Result Value Ref Range   aPTT 30 24 - 37 seconds  Comprehensive metabolic panel  Result Value Ref Range   Sodium 135 135 - 145 mmol/L   Potassium 4.9 3.5 - 5.1 mmol/L   Chloride 97 (L) 101 - 111 mmol/L   CO2 33 (H) 22 - 32 mmol/L   Glucose, Bld 94 65 - 99 mg/dL   BUN 14 6 - 20 mg/dL   Creatinine, Ser 4.09 0.61 - 1.24 mg/dL   Calcium 8.9 8.9 - 81.1 mg/dL   Total Protein 6.8 6.5 - 8.1 g/dL   Albumin 3.9 3.5 - 5.0 g/dL   AST 26 15 - 41 U/L   ALT 14 (L) 17 - 63 U/L   Alkaline Phosphatase 57 38 - 126 U/L   Total Bilirubin 0.8 0.3 - 1.2 mg/dL   GFR calc non Af Amer >60 >60 mL/min   GFR calc Af Amer >60 >60 mL/min   Anion gap 5 5 - 15  CBC WITH DIFFERENTIAL  Result Value Ref Range   WBC 8.0 4.0 - 10.5 K/uL   RBC 4.16 (L) 4.22 - 5.81 MIL/uL   Hemoglobin 13.3 13.0 - 17.0 g/dL   HCT 91.4 78.2 - 95.6 %   MCV 100.0 78.0 - 100.0 fL   MCH 32.0 26.0 - 34.0 pg   MCHC 32.0 30.0 - 36.0 g/dL   RDW 21.3 08.6 - 57.8 %   Platelets 201 150 - 400 K/uL   Neutrophils Relative % 78 (H) 43 - 77 %   Neutro Abs 6.2 1.7 - 7.7 K/uL   Lymphocytes Relative 17 12 - 46 %   Lymphs Abs 1.3 0.7 - 4.0 K/uL   Monocytes Relative 4 3 - 12 %   Monocytes Absolute 0.4 0.1 - 1.0 K/uL   Eosinophils Relative 1 0 - 5 %   Eosinophils Absolute 0.1 0.0 - 0.7 K/uL   Basophils Relative 0 0 - 1 %   Basophils Absolute 0.0 0.0 - 0.1 K/uL  Protime-INR  Result Value Ref Range   Prothrombin Time 14.5 11.6 - 15.2 seconds   INR 1.11 0.00 - 1.49  Blood gas, arterial ([facility], [facility], [facility])  Result Value Ref Range   FIO2 0.21 %   pH, Arterial 7.401 7.350 - 7.450   pCO2 arterial 50.9 (H) 35.0 - 45.0 mmHg   pO2, Arterial 52.7 (L) 80.0 - 100.0 mmHg   Bicarbonate 30.8 (H) 20.0 - 24.0 mEq/L   TCO2 27.6 0 - 100 mmol/L   Acid-Base Excess 5.5  (H) 0.0 - 2.0 mmol/L   O2 Saturation 88.1 %   Patient temperature 99.2    Collection site RIGHT RADIAL    Drawn by [id number]    Sample type ARTERIAL DRAW    Allens test (pass/fail)  PASS PASS  I-Stat CG4 Lactic Acid, ED  (not at [facility])  Result Value Ref Range   Lactic Acid, Venous 1.35 0.5 - 2.0 mmol/L      Assessment & Plan:  ROI signed for Dr Clarene Duke and sent off today. Problem List Items Addressed This Visit    Cachectic    Encouraged daily ensure as well as regular meals. Continue to monitor. Recent alb 3.9.      Chronic back pain    Has been on percocets 5/325mg  chronically, endorses he had #120 filled last Thursday and has lost this script. Advised to take tylenol 500-1000mg  TID scheduled for next month until ne is next due for refill. UDS and controlled substance agreement today      Cognitive impairment    Recommend reassess after completes taper off valium.      Depression    Continue celexa  daily.      Relevant Medications   diazepam (VALIUM) 5 MG tablet   citalopram (CELEXA) 40 MG tablet   Epileptic seizures - Primary    Likely from too rapid benzo withdrawal. Discussed recommendation and all reasons for continued slowed benzo taper - and will prescribe  TID for next 2 weeks then return to clinic to continue benzo taper. Also recommended f/u with neurology. Pt agrees. UDS and controlled substance agreement today      Relevant Medications   levETIRAcetam (KEPPRA) 500 MG tablet   Essential tremor    Was on valium for this. Will slowly taper off. Pt interested in trial of primidone.      History of alcohol abuse   Tobacco abuse    Continue to encourage attempts at cessation.          Follow up plan: Return in about 2 weeks (around 12/15/2014), or if symptoms worsen or fail to improve, for follow up visit.

## 2014-12-01 NOTE — Progress Notes (Signed)
Pre visit review using our clinic review tool, if applicable. No additional management support is needed unless otherwise documented below in the visit note. 

## 2014-12-01 NOTE — Assessment & Plan Note (Signed)
Continue to encourage attempts at cessation.

## 2014-12-01 NOTE — Assessment & Plan Note (Signed)
Recommend reassess after completes taper off valium.

## 2014-12-01 NOTE — Assessment & Plan Note (Signed)
Continue celexa 40 mg daily 

## 2014-12-07 ENCOUNTER — Emergency Department (HOSPITAL_COMMUNITY): Payer: Medicare Other

## 2014-12-07 ENCOUNTER — Observation Stay (HOSPITAL_COMMUNITY): Payer: Medicare Other

## 2014-12-07 ENCOUNTER — Other Ambulatory Visit: Payer: Self-pay

## 2014-12-07 ENCOUNTER — Encounter (HOSPITAL_COMMUNITY): Payer: Self-pay

## 2014-12-07 ENCOUNTER — Observation Stay (HOSPITAL_COMMUNITY)
Admission: EM | Admit: 2014-12-07 | Discharge: 2014-12-09 | Disposition: A | Discharge status: Home / Self Care | Payer: Medicare Other | Source: Home / Self Care | Attending: Emergency Medicine | Admitting: Emergency Medicine

## 2014-12-07 DIAGNOSIS — T424X5A Adverse effect of benzodiazepines, initial encounter: Secondary | ICD-10-CM | POA: Diagnosis not present

## 2014-12-07 DIAGNOSIS — R4182 Altered mental status, unspecified: Secondary | ICD-10-CM

## 2014-12-07 DIAGNOSIS — Z79899 Other long term (current) drug therapy: Secondary | ICD-10-CM

## 2014-12-07 DIAGNOSIS — G250 Essential tremor: Secondary | ICD-10-CM | POA: Diagnosis not present

## 2014-12-07 DIAGNOSIS — R569 Unspecified convulsions: Secondary | ICD-10-CM | POA: Diagnosis not present

## 2014-12-07 DIAGNOSIS — F329 Major depressive disorder, single episode, unspecified: Secondary | ICD-10-CM | POA: Insufficient documentation

## 2014-12-07 DIAGNOSIS — I952 Hypotension due to drugs: Secondary | ICD-10-CM | POA: Diagnosis not present

## 2014-12-07 DIAGNOSIS — R410 Disorientation, unspecified: Secondary | ICD-10-CM | POA: Insufficient documentation

## 2014-12-07 DIAGNOSIS — E871 Hypo-osmolality and hyponatremia: Secondary | ICD-10-CM | POA: Diagnosis not present

## 2014-12-07 DIAGNOSIS — F17210 Nicotine dependence, cigarettes, uncomplicated: Secondary | ICD-10-CM

## 2014-12-07 DIAGNOSIS — G8929 Other chronic pain: Secondary | ICD-10-CM

## 2014-12-07 DIAGNOSIS — Z720 Tobacco use: Secondary | ICD-10-CM | POA: Diagnosis not present

## 2014-12-07 DIAGNOSIS — Z7982 Long term (current) use of aspirin: Secondary | ICD-10-CM | POA: Insufficient documentation

## 2014-12-07 DIAGNOSIS — Z885 Allergy status to narcotic agent status: Secondary | ICD-10-CM

## 2014-12-07 DIAGNOSIS — F05 Delirium due to known physiological condition: Secondary | ICD-10-CM

## 2014-12-07 DIAGNOSIS — M542 Cervicalgia: Secondary | ICD-10-CM | POA: Insufficient documentation

## 2014-12-07 DIAGNOSIS — Z681 Body mass index (BMI) 19 or less, adult: Secondary | ICD-10-CM | POA: Diagnosis not present

## 2014-12-07 DIAGNOSIS — F0390 Unspecified dementia without behavioral disturbance: Secondary | ICD-10-CM

## 2014-12-07 DIAGNOSIS — Z888 Allergy status to other drugs, medicaments and biological substances status: Secondary | ICD-10-CM | POA: Insufficient documentation

## 2014-12-07 DIAGNOSIS — R627 Adult failure to thrive: Secondary | ICD-10-CM | POA: Insufficient documentation

## 2014-12-07 DIAGNOSIS — Z981 Arthrodesis status: Secondary | ICD-10-CM | POA: Diagnosis not present

## 2014-12-07 DIAGNOSIS — R062 Wheezing: Secondary | ICD-10-CM

## 2014-12-07 DIAGNOSIS — R45851 Suicidal ideations: Secondary | ICD-10-CM | POA: Insufficient documentation

## 2014-12-07 DIAGNOSIS — G40409 Other generalized epilepsy and epileptic syndromes, not intractable, without status epilepticus: Secondary | ICD-10-CM | POA: Diagnosis not present

## 2014-12-07 DIAGNOSIS — M549 Dorsalgia, unspecified: Secondary | ICD-10-CM | POA: Insufficient documentation

## 2014-12-07 DIAGNOSIS — J449 Chronic obstructive pulmonary disease, unspecified: Secondary | ICD-10-CM | POA: Diagnosis not present

## 2014-12-07 DIAGNOSIS — R64 Cachexia: Secondary | ICD-10-CM | POA: Diagnosis not present

## 2014-12-07 LAB — CBC WITH DIFFERENTIAL/PLATELET
Basophils Absolute: 0 10*3/uL (ref 0.0–0.1)
Basophils Relative: 0 % (ref 0–1)
EOS PCT: 1 % (ref 0–5)
Eosinophils Absolute: 0.1 10*3/uL (ref 0.0–0.7)
HCT: 41.9 % (ref 39.0–52.0)
HEMOGLOBIN: 14.1 g/dL (ref 13.0–17.0)
LYMPHS ABS: 1.8 10*3/uL (ref 0.7–4.0)
Lymphocytes Relative: 20 % (ref 12–46)
MCH: 32.4 pg (ref 26.0–34.0)
MCHC: 33.7 g/dL (ref 30.0–36.0)
MCV: 96.3 fL (ref 78.0–100.0)
MONOS PCT: 5 % (ref 3–12)
Monocytes Absolute: 0.5 10*3/uL (ref 0.1–1.0)
NEUTROS PCT: 74 % (ref 43–77)
Neutro Abs: 6.6 10*3/uL (ref 1.7–7.7)
Platelets: 183 10*3/uL (ref 150–400)
RBC: 4.35 MIL/uL (ref 4.22–5.81)
RDW: 12.9 % (ref 11.5–15.5)
WBC: 9 10*3/uL (ref 4.0–10.5)

## 2014-12-07 LAB — URINALYSIS, ROUTINE W REFLEX MICROSCOPIC
BILIRUBIN URINE: NEGATIVE
Glucose, UA: NEGATIVE mg/dL
HGB URINE DIPSTICK: NEGATIVE
KETONES UR: NEGATIVE mg/dL
LEUKOCYTES UA: NEGATIVE
Nitrite: NEGATIVE
PH: 7 (ref 5.0–8.0)
Protein, ur: 30 mg/dL — AB
Specific Gravity, Urine: 1.013 (ref 1.005–1.030)
Urobilinogen, UA: 1 mg/dL (ref 0.0–1.0)

## 2014-12-07 LAB — COMPREHENSIVE METABOLIC PANEL
ALBUMIN: 4.1 g/dL (ref 3.5–5.0)
ALK PHOS: 61 U/L (ref 38–126)
ALT: 16 U/L — AB (ref 17–63)
AST: 24 U/L (ref 15–41)
Anion gap: 9 (ref 5–15)
BUN: 9 mg/dL (ref 6–20)
CALCIUM: 9.1 mg/dL (ref 8.9–10.3)
CHLORIDE: 97 mmol/L — AB (ref 101–111)
CO2: 29 mmol/L (ref 22–32)
Creatinine, Ser: 0.64 mg/dL (ref 0.61–1.24)
Glucose, Bld: 87 mg/dL (ref 65–99)
Potassium: 4.1 mmol/L (ref 3.5–5.1)
SODIUM: 135 mmol/L (ref 135–145)
Total Bilirubin: 0.8 mg/dL (ref 0.3–1.2)
Total Protein: 6.7 g/dL (ref 6.5–8.1)

## 2014-12-07 LAB — URINE MICROSCOPIC-ADD ON

## 2014-12-07 LAB — MRSA PCR SCREENING: MRSA BY PCR: NEGATIVE

## 2014-12-07 MED ORDER — ENOXAPARIN SODIUM 40 MG/0.4ML ~~LOC~~ SOLN
40.0000 mg | SUBCUTANEOUS | Status: DC
  Administered 2014-12-07 – 2014-12-08 (×2): 40 mg via SUBCUTANEOUS
  Filled 2014-12-07 (×3): qty 0.4

## 2014-12-07 MED ORDER — IPRATROPIUM-ALBUTEROL 0.5-2.5 (3) MG/3ML IN SOLN
3.0000 mL | Freq: Three times a day (TID) | RESPIRATORY_TRACT | Status: DC
  Filled 2014-12-07: qty 3

## 2014-12-07 MED ORDER — TETANUS-DIPHTH-ACELL PERTUSSIS 5-2.5-18.5 LF-MCG/0.5 IM SUSP
0.5000 mL | Freq: Once | INTRAMUSCULAR | Status: AC
  Administered 2014-12-07: 0.5 mL via INTRAMUSCULAR
  Filled 2014-12-07: qty 0.5

## 2014-12-07 MED ORDER — CYCLOBENZAPRINE HCL 5 MG PO TABS
5.0000 mg | ORAL_TABLET | Freq: Once | ORAL | Status: AC
  Administered 2014-12-07: 5 mg via ORAL
  Filled 2014-12-07: qty 1

## 2014-12-07 MED ORDER — NICOTINE 7 MG/24HR TD PT24
7.0000 mg | MEDICATED_PATCH | Freq: Every day | TRANSDERMAL | Status: DC
  Administered 2014-12-07 – 2014-12-08 (×2): 7 mg via TRANSDERMAL
  Filled 2014-12-07 (×3): qty 1

## 2014-12-07 MED ORDER — ASPIRIN EC 81 MG PO TBEC
81.0000 mg | DELAYED_RELEASE_TABLET | Freq: Every day | ORAL | Status: DC
  Administered 2014-12-09: 81 mg via ORAL
  Filled 2014-12-07 (×2): qty 1

## 2014-12-07 MED ORDER — SODIUM CHLORIDE 0.9 % IV BOLUS (SEPSIS)
500.0000 mL | Freq: Once | INTRAVENOUS | Status: AC
  Administered 2014-12-07: 500 mL via INTRAVENOUS

## 2014-12-07 MED ORDER — LORAZEPAM 2 MG/ML IJ SOLN
1.0000 mg | Freq: Once | INTRAMUSCULAR | Status: AC
  Administered 2014-12-08: 1 mg via INTRAVENOUS
  Filled 2014-12-07 (×2): qty 1

## 2014-12-07 MED ORDER — LEVETIRACETAM 500 MG/5ML IV SOLN
1000.0000 mg | Freq: Once | INTRAVENOUS | Status: AC
  Administered 2014-12-07: 1000 mg via INTRAVENOUS
  Filled 2014-12-07: qty 10

## 2014-12-07 MED ORDER — ENSURE ENLIVE PO LIQD
237.0000 mL | Freq: Two times a day (BID) | ORAL | Status: DC
  Administered 2014-12-07: 237 mL via ORAL
  Filled 2014-12-07 (×2): qty 237

## 2014-12-07 MED ORDER — LORAZEPAM 2 MG/ML IJ SOLN
1.0000 mg | Freq: Once | INTRAMUSCULAR | Status: AC
  Administered 2014-12-07: 1 mg via INTRAVENOUS

## 2014-12-07 MED ORDER — OXYCODONE-ACETAMINOPHEN 5-325 MG PO TABS
1.0000 | ORAL_TABLET | ORAL | Status: DC | PRN
  Administered 2014-12-07 – 2014-12-08 (×2): 1 via ORAL
  Administered 2014-12-08: 1.5 via ORAL
  Administered 2014-12-09: 1 via ORAL
  Administered 2014-12-09 (×2): 1.5 via ORAL
  Filled 2014-12-07 (×2): qty 2
  Filled 2014-12-07 (×2): qty 1
  Filled 2014-12-07: qty 2
  Filled 2014-12-07: qty 1

## 2014-12-07 MED ORDER — CITALOPRAM HYDROBROMIDE 40 MG PO TABS
40.0000 mg | ORAL_TABLET | Freq: Every day | ORAL | Status: DC
  Administered 2014-12-07 – 2014-12-08 (×2): 40 mg via ORAL
  Filled 2014-12-07 (×3): qty 1

## 2014-12-07 NOTE — ED Notes (Signed)
Neurology at bedside.

## 2014-12-07 NOTE — ED Notes (Signed)
Pt son called EMS to transport pt for possible seizure.  Pt has a hx of frequent seizures but there was no evidence of seizure today.  EMS reports that son called because pt was talking into a phone charger instead of the cell phone and felt that he was acting "weird".  Pt was recently diagnosed with dementia and is A&Ox4 upon arrival.  Pt reports that he remembers the whole incident and that he was speaking into the phone.  Pt was on Valium x4 years and recently took himself off of it 10 days ago.

## 2014-12-07 NOTE — Progress Notes (Signed)
RT Note: Patient off floor at this time. Unable to give nebulizer tx.
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
## 2014-12-07 NOTE — ED Notes (Addendum)
Pt now talking, and following commands, RN aware
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
## 2014-12-07 NOTE — ED Notes (Signed)
  Checked on pt, pt staring into space, follows this NT with eyes, RN made aware as well as MD, possible focal seizure.

## 2014-12-07 NOTE — ED Notes (Addendum)
Walked into pt room and pt would not respond and not follow directions.  Pt would follow RN with his eyes but would not respond.  Merilynn Finland MD made aware and will come see pt.

## 2014-12-07 NOTE — ED Notes (Signed)
RN went into room to initiate IV.  Pt is now talking and back at baseline.  MD made aware and advises to hold Ativan at this time but will keep it ordered if needed in future.
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
## 2014-12-07 NOTE — ED Notes (Signed)
Pt had a witnessed seizure by tech Marcelle SmilingNatasha. States that he was full body shaking for about 15 seconds. sats dropped to 50% on RA. Dr Criss AlvineGoldston at bedside. Pt placed on nob-rebreather. 1mg  Ativan given. Pts sats at 100%.

## 2014-12-08 DIAGNOSIS — R627 Adult failure to thrive: Secondary | ICD-10-CM | POA: Diagnosis not present

## 2014-12-08 DIAGNOSIS — R569 Unspecified convulsions: Secondary | ICD-10-CM | POA: Diagnosis not present

## 2014-12-08 LAB — CBC
HEMATOCRIT: 39.5 % (ref 39.0–52.0)
Hemoglobin: 13.2 g/dL (ref 13.0–17.0)
MCH: 32.6 pg (ref 26.0–34.0)
MCHC: 33.4 g/dL (ref 30.0–36.0)
MCV: 97.5 fL (ref 78.0–100.0)
Platelets: 201 10*3/uL (ref 150–400)
RBC: 4.05 MIL/uL — ABNORMAL LOW (ref 4.22–5.81)
RDW: 13.1 % (ref 11.5–15.5)
WBC: 10.2 10*3/uL (ref 4.0–10.5)

## 2014-12-08 LAB — BASIC METABOLIC PANEL
Anion gap: 5 (ref 5–15)
BUN: 14 mg/dL (ref 6–20)
CO2: 28 mmol/L (ref 22–32)
Calcium: 9 mg/dL (ref 8.9–10.3)
Chloride: 102 mmol/L (ref 101–111)
Creatinine, Ser: 0.69 mg/dL (ref 0.61–1.24)
GFR calc Af Amer: >60 mL/min (ref >60)
GLUCOSE: 99 mg/dL (ref 65–99)
Potassium: 4.2 mmol/L (ref 3.5–5.1)
Sodium: 135 mmol/L (ref 135–145)

## 2014-12-08 LAB — TSH: TSH: 2.433 u[IU]/mL (ref 0.350–4.500)

## 2014-12-08 LAB — LEVETIRACETAM LEVEL: Levetiracetam Lvl: 33.5 ug/mL (ref 10.0–40.0)

## 2014-12-08 LAB — RAPID URINE DRUG SCREEN, HOSP PERFORMED
AMPHETAMINES: NOT DETECTED
BARBITURATES: NOT DETECTED
Benzodiazepines: POSITIVE — AB
Cocaine: NOT DETECTED
OPIATES: NOT DETECTED
TETRAHYDROCANNABINOL: NOT DETECTED

## 2014-12-08 LAB — GLUCOSE, CAPILLARY: Glucose-Capillary: 133 mg/dL — ABNORMAL HIGH (ref 65–99)

## 2014-12-08 LAB — MAGNESIUM: Magnesium: 2 mg/dL (ref 1.7–2.4)

## 2014-12-08 LAB — T4, FREE: Free T4: 1 ng/dL (ref 0.61–1.12)

## 2014-12-08 MED ORDER — SODIUM CHLORIDE 0.9 % IV SOLN
750.0000 mg | Freq: Two times a day (BID) | INTRAVENOUS | Status: DC
  Filled 2014-12-08: qty 7.5

## 2014-12-08 MED ORDER — CYCLOBENZAPRINE HCL 5 MG PO TABS
5.0000 mg | ORAL_TABLET | Freq: Once | ORAL | Status: AC
  Administered 2014-12-08: 5 mg via ORAL
  Filled 2014-12-08: qty 1

## 2014-12-08 MED ORDER — ALBUTEROL SULFATE HFA 108 (90 BASE) MCG/ACT IN AERS: 2.0000 | INHALATION_SPRAY | Freq: Four times a day (QID) | RESPIRATORY_TRACT | Status: DC | PRN

## 2014-12-08 MED ORDER — CYCLOBENZAPRINE HCL 5 MG PO TABS: 5.0000 mg | ORAL_TABLET | Freq: Every evening | ORAL | Status: DC | PRN

## 2014-12-08 MED ORDER — SODIUM CHLORIDE 0.9 % IV SOLN
500.0000 mg | Freq: Once | INTRAVENOUS | Status: AC
  Administered 2014-12-08: 500 mg via INTRAVENOUS
  Filled 2014-12-08: qty 5

## 2014-12-08 MED ORDER — ENSURE ENLIVE PO LIQD
237.0000 mL | Freq: Three times a day (TID) | ORAL | Status: DC
  Administered 2014-12-08 – 2014-12-09 (×2): 237 mL via ORAL

## 2014-12-08 MED ORDER — IPRATROPIUM-ALBUTEROL 0.5-2.5 (3) MG/3ML IN SOLN: 3.0000 mL | Freq: Four times a day (QID) | RESPIRATORY_TRACT | Status: DC | PRN

## 2014-12-08 MED ORDER — LEVETIRACETAM 750 MG PO TABS
750.0000 mg | ORAL_TABLET | Freq: Two times a day (BID) | ORAL | Status: DC
  Filled 2014-12-08 (×2): qty 1

## 2014-12-08 MED ORDER — LEVETIRACETAM 750 MG PO TABS: 750.0000 mg | ORAL_TABLET | Freq: Two times a day (BID) | ORAL | Status: DC

## 2014-12-08 MED ORDER — LEVETIRACETAM 500 MG/5ML IV SOLN
1000.0000 mg | Freq: Two times a day (BID) | INTRAVENOUS | Status: DC
  Administered 2014-12-09: 1000 mg via INTRAVENOUS
  Filled 2014-12-08 (×2): qty 10

## 2014-12-08 MED ORDER — OXYCODONE-ACETAMINOPHEN 5-325 MG PO TABS: 1.0000 | ORAL_TABLET | Freq: Every evening | ORAL | Status: DC | PRN

## 2014-12-08 NOTE — Progress Notes (Addendum)
PROGRESS NOTE  Ronald [name] Sr. ZOX:[id number] DOB: 12/29/1955 DOA: 12/07/2014 PCP: Eustaquio Boyden, MD  HPI/Recap of past 24 hours:  Patient was doing well this am, cleared by neurology to be discharged, however, patient had another witnessed seizure lasting less than , now not tracking and post ictal. Discharge cancelled, patient made npo, keppra switch to IV, RN to notify neurology. I have tried to reach patient sister who i talked to this am at [phone] twice, not able to reach her, left voice mail.  Assessment/Plan: Active Problems:   Seizure   FTT (failure to thrive) in adult  Recurrent seizure, keppra level pending, neurologist to f/u on final test result. He was loaded with IV keppra, maintenance dose keppra increased to 750mg  po bid per neurology recs. On seizure/fall/aspiration precaution.  Observation in SUD overnight. recurrent seizure in am, discharge cancelled. Keep npo, bed rest, changed to IV keppra, neurology to see patient agian.   Neck pain: CT neck no acute findings  Wheezing: cxr no acute finding, prn nebs. Patient denies sob, prn albuterol prescribed.  Chronic pain: continue home prn percocet. Patient report he run of of pain meds, requested percocet at discharge, 10tabs of percocet 5/325 prescribed at discharge. Patient also requested muscle relaxant at discharge , flexeril 5mg  po qhs prn total of 15tabs prescribed.  Smoker: smoking cessation education, declined nicotine.  FTT, with ? Dementia, under ongoing care by neurologist Dr. Everlena Cooper,  Initial plan was to discharge home with [facility] with RN to supervise medication, social worker, home health aids, physical therapy.  Discharge cancelled, may need assisted living.  H/o depression, on celexa.  Sister reported that patient's son observed patient trying to cut himself with his pocket knife when he appearred confused prior to brought to the hospital. Patient report that he has no  recollection of it, he currently denies SI/HI,  but sister reported patient has history of suicidal attempt in his twenties and requested psychiatry consult, psychiatry paged. Talked to psychiatry consult staff, patient is on put on their consult list, will be seen tomorrow on 6/29.     Code Status: full   Family Communication: Patient and patient's sister Ms Ronald [name] on her cell phone [phone])  Procedures:  none  Consultations:  neurology  Disposition Plan: remain in SDU   Antibiotics:  none   Objective: BP 96/54 mmHg  Pulse 66  Temp(Src) 98.1 F (36.7 C) (Oral)  Resp 15  Ht 5\' 11"  (1.803 m)  Wt 49.3 kg (108 lb 11 oz)  BMI 15.17 kg/m2  SpO2 94%  Intake/Output Summary (Last 24 hours) at 12/08/14 1158 Last data filed at 12/08/14 0300  Gross per 24 hour  Intake      0 ml  Output    275 ml  Net   -275 ml   Filed Weights   12/07/14 1919  Weight: 49.3 kg (108 lb 11 oz)    Exam:   General:  NAD  Cardiovascular: RRR  Respiratory: mild wheezing  Abdomen: Soft/ND/NT, positive BS  Musculoskeletal: No Edema  Neuro: patient had another episode of seizure with staring and not tracking afterward, back to normal baseline in . No focal deficit.  Data Reviewed: Basic Metabolic Panel:  Recent Labs Lab 12/07/14 1532 12/08/14 0350  NA 135 135  K 4.1 4.2  CL 97* 102  CO2 29 28  GLUCOSE 87 99  BUN 9 14  CREATININE 0.64 0.69  CALCIUM 9.1 9.0  MG  --  2.0   Liver Function Tests:  Recent Labs Lab 12/07/14 1532  AST 24  ALT 16*  ALKPHOS 61  BILITOT 0.8  PROT 6.7  ALBUMIN 4.1   No results for input(s): LIPASE, AMYLASE in the last 168 hours. No results for input(s): AMMONIA in the last 168 hours. CBC:  Recent Labs Lab 12/07/14 1532 12/08/14 0350  WBC 9.0 10.2  NEUTROABS 6.6  --   HGB 14.1 13.2  HCT 41.9 39.5  MCV 96.3 97.5  PLT 183 201   Cardiac Enzymes:   No results for input(s): CKTOTAL, CKMB, CKMBINDEX, TROPONINI in  the last 168 hours. BNP (last 3 results) No results for input(s): BNP in the last 8760 hours.  ProBNP (last 3 results) No results for input(s): PROBNP in the last 8760 hours.  CBG: No results for input(s): GLUCAP in the last 168 hours.  Recent Results (from the past 240 hour(s))  MRSA PCR Screening     Status: None   Collection Time: 12/07/14  7:39 PM  Result Value Ref Range Status   MRSA by PCR NEGATIVE NEGATIVE Final    Comment:        The GeneXpert MRSA Assay (FDA approved for NASAL specimens only), is one component of a comprehensive MRSA colonization surveillance program. It is not intended to diagnose MRSA infection nor to guide or monitor treatment for MRSA infections.      Studies: Ct Head Wo Contrast  12/07/2014   CLINICAL DATA:  Altered mental status a while mowing the yd, history of seizures, smoking  EXAM: CT HEAD WITHOUT CONTRAST  TECHNIQUE: Contiguous axial images were obtained from the base of the skull through the vertex without intravenous contrast.  COMPARISON:  07/28/2014  FINDINGS: Generalized atrophy.  Normal ventricular morphology.  No midline shift or mass effect.  Normal appearance of brain parenchyma.  No intracranial hemorrhage, mass lesion or evidence acute infarction.  No extra-axial fluid collections.  Sinuses clear and bones unremarkable.  IMPRESSION: No acute intracranial abnormalities.  No interval change.   Electronically Signed   By: Ulyses Southward M.D.   On: 12/07/2014 16:31   Ct Cervical Spine Wo Contrast  12/07/2014   CLINICAL DATA:  Seizure. History of C3 vertebral body fracture. History of prior cervical fusion.  EXAM: CT CERVICAL SPINE WITHOUT CONTRAST  TECHNIQUE: Multidetector CT imaging of the cervical spine was performed without intravenous contrast. Multiplanar CT image reconstructions were also generated.  COMPARISON:  04/30/2014  FINDINGS: Previously identified fracture of the C3 vertebral body is now healed. There is evidence of prior  cervical fusion at C4-5 and C5-6 with stable appearance of fusion hardware. The C4-5 levels shows complete bony fusion. The C5-6 level is not completely fused. Appearance is stable. No evidence of listhesis or soft tissue swelling. The visualized airway is normally patent. Emphysematous changes are seen at both lung apices.  IMPRESSION: No acute fracture identified. Previous C3 fracture shows interval healing. Stable appearance status post prior cervical fusion at C4-5 and C5-6.   Electronically Signed   By: Irish Lack M.D.   On: 12/07/2014 20:59    Scheduled Meds: . aspirin EC  81 mg Oral Daily  . citalopram  40 mg Oral QHS  . enoxaparin (LOVENOX) injection  40 mg Subcutaneous Q24H  . feeding supplement (ENSURE ENLIVE)  237 mL Oral BID  . levETIRAcetam  750 mg Intravenous Q12H  . nicotine  7 mg Transdermal Daily    Continuous Infusions:    Time spent:  Jerritt Cardoza MD, PhD  [facility] Pager [phone]. If 7PM-7AM, please contact night-coverage at [email or web address], password [email or web address] 12/08/2014, 11:58 AM

## 2014-12-08 NOTE — Progress Notes (Signed)
Pt responsive, answering questions and conversing appropriately, but slightly confused s/p seizure. Will continue to monitor

## 2014-12-08 NOTE — Progress Notes (Signed)
Paged Dr Hosie PoissonSumner to come see pt. Will continue to monitor.

## 2014-12-08 NOTE — Discharge Summary (Addendum)
Discharge Summary  Ronald WILKERSON Sr. WUJ:[id number] DOB: [birth year]  PCP: Eustaquio Boyden, MD  Admit date: 12/07/2014 Discharge date: 12/09/2014  Time spent: >59mins  Recommendations for Outpatient Follow-up:  1. F/u with PMD Dr. Wynona Canes within two weeks for hospital discharge follow up, pmd to assist smoking cessation. Out patient also with his psychiatrist for depression. 2. F/u with neurology D.r Everlena Cooper in two weeks for recurrent seizure, dementia  Discharge Diagnoses:  Active Hospital Problems   Diagnosis Date Noted  . FTT (failure to thrive) in adult   . Seizure 12/07/2014    Resolved Hospital Problems   Diagnosis Date Noted Date Resolved  No resolved problems to display.    Discharge Condition: stable  Diet recommendation: regular diet  Filed Weights   12/07/14 1919  Weight: 49.3 kg (108 lb 11 oz)    History of present illness:  Ronald GOODLIN Sr. is a 59 y.o. male  with history of seizures (suspected secondary to benzo withdrawal), chronic pain presenting with altered mental status. Per EMS, patient noted to be confused, holding and talking into his cell phone charger, noted episodes of staring off without responding. While in ED had ~15 second episode of GTC seizure. Currently back to his baseline.   it is not very clear wether patient has been taking valium or not in the past two weeks.  ED course:Ct head negative, basic lab works wnl, keppra level pending, neurology consulted, patient is loaded with keppra. Admitted to hospitalist service.  By the time I walked to the ED room, patient reported he is back to his baseline, answer questions appropriately. Suprisingly, immediately upon me entering the room, the patient was able to say my full name, he states that he remembered my name from his last hospitalization in February, however, when asked wether he is seen by a neurologist in the ED earlier, he states he could not remember, he also could not remember what has  happened at home and in the ED.  he c/o some neck pain reported is new, denies fall, also report chronic back pain. Some congested nonproductive cough noted during encounter, he does has poor short term memory, immediate recall 2/3, he states his has been having memory problem for a year and was diagnosed with dementia. He reported he has gained weight since last hospitalization in February. Otherwise, no headache, no vision changes, no chest pain, no sob, denies n/v, no ab pain, no constipation, no diarrhea, no fever, no lower extremity edema. Reported ambulate without problem at baseline.    Hospital Course:   Recurrent seizure   He was loaded with IV keppra, maintenance dose keppra increased to 1000 mg po bid per neurology recs. On seizure/fall/aspiration precaution. Neurology cleared him for discharge with outpateitn neurology follow up. Valium discontinued, patient reported he has not been taking valium for the last two weeks.   Neck pain: CT neck no acute findings  Wheezing: cxr no acute finding, prn nebs. Patient denies sob, prn albuterol prescribed.  Chronic pain: continue home prn percocet. Patient report he run of of pain meds, requested percocet, 10tabs of percocet 5/325 prescribed at discharge. Patient also requested muscle relaxant , flexeril 5mg  po qhs prn total of 15tabs prescribed.  Smoker: smoking cessation education, declined nicotine.  FTT, with ? Dementia, under ongoing care by neurologist Dr. Everlena Cooper, [facility] with RN to supervise medication, social worker, home health aids, physical therapy.   History of depression with some suicidal ideation which  was questionable, family had expressed some concerns therefore psychiatry was consulted. Cleared for home by psychiatry, she denies any suicidal or homicidal ideations. Will go home with home PT.   Consultants: Neurology  Code Status: full   Family Communication: Patient and  patient's sister Ms [name] on her cell phone [phone]) by Dr Roda Shutters -  about [facility] and patient to get life alert device, advise them patient should not drive, should not cary sharp objects. Care manager to work with patient and family for discharge need. They expressed understanding and appreciation.  Procedures:  none  Consultations:  neurology  Discharge Exam: BP 93/65 mmHg  Pulse 59  Temp(Src) 98.3 F (36.8 C) (Oral)  Resp 19  Ht 5\' 11"  (1.803 m)  Wt 49.3 kg (108 lb 11 oz)  BMI 15.17 kg/m2  SpO2 96%   General: AAOX3, NAD, cachectic,   Eyes: PERRL  ENT: unremarkable  Neck: supple, no JVD  Cardiovascular: RRR  Respiratory: mild wheezing  Abdomen: soft/ND/ND, positive bowel sounds  Skin: no rash  Musculoskeletal: No edema  Psychiatric: calm/cooperative  Neurologic: no focal findings , aaox3, but tangential speech, poor memory, intention tremor    Discharge Instructions       Discharge Instructions    Diet - low sodium heart healthy    Complete by:  As directed      Discharge instructions    Complete by:  As directed   Do not drive, operating heavy machinery, perform activities at heights, swimming or participation in water activities or provide baby sitting services until you have seen by Primary MD or a Neurologist and advised to do so again.  Follow with Primary MD Eustaquio Boyden, MD in 7 days   Get CBC, CMP, 2 view Chest X ray checked  by Primary MD next visit.    Activity: As tolerated with Full fall precautions use walker/cane & assistance as needed   Disposition Home     Diet: Heart Healthy    For Heart failure patients - Check your Weight same time everyday, if you gain over 2 pounds, or you develop in leg swelling, experience more shortness of breath or chest pain, call your Primary MD immediately. Follow Cardiac Low Salt Diet and 1.5 lit/day fluid restriction.   On your next visit with your primary care  physician please Get Medicines reviewed and adjusted.   Please request your Prim.MD to go over all Hospital Tests and Procedure/Radiological results at the follow up, please get all Hospital records sent to your Prim MD by signing hospital release before you go home.   If you experience worsening of your admission symptoms, develop shortness of breath, life threatening emergency, suicidal or homicidal thoughts you must seek medical attention immediately by calling 911 or calling your MD immediately  if symptoms less severe.  You Must read complete instructions/literature along with all the possible adverse reactions/side effects for all the Medicines you take and that have been prescribed to you. Take any new Medicines after you have completely understood and accpet all the possible adverse reactions/side effects.   Do not drive when taking Pain medications.    Do not take more than prescribed Pain, Sleep and Anxiety Medications  Special Instructions: If you have smoked or chewed Tobacco  in the last 2 yrs please stop smoking, stop any regular Alcohol  and or any Recreational drug use.  Wear Seat belts while driving.   Please note  You were cared for by a  hospitalist during your hospital stay. If you have any questions about your discharge medications or the care you received while you were in the hospital after you are discharged, you can call the unit and asked to speak with the hospitalist on call if the hospitalist that took care of you is not available. Once you are discharged, your primary care physician will handle any further medical issues. Please note that NO REFILLS for any discharge medications will be authorized once you are discharged, as it is imperative that you return to your primary care physician (or establish a relationship with a primary care physician if you do not have one) for your aftercare needs so that they can reassess your need for medications and monitor your lab  values.     Face-to-face encounter (required for Medicare/Medicaid patients)    Complete by:  As directed   I Xu,Fang certify that this patient is under my care and that I, or a nurse practitioner or physician's assistant working with me, had a face-to-face encounter that meets the physician face-to-face encounter requirements with this patient on 12/08/2014. The encounter with the patient was in whole, or in part for the following medical condition(s) which is the primary reason for home health care (List medical condition): FTT  The encounter with the patient was in whole, or in part, for the following medical condition, which is the primary reason for home health care:  FTT, recurrent seizure, dementia  I certify that, based on my findings, the following services are medically necessary home health services:   Nursing Physical therapy    Reason for Medically Necessary Home Health Services:  Skilled Nursing- Change/Decline in Patient Status  My clinical findings support the need for the above services:  Cognitive impairments, dementia, or mental confusion  that make it unsafe to leave home  Further, I certify that my clinical findings support that this patient is homebound due to:  Mental confusion     Home Health    Complete by:  As directed   To provide the following care/treatments:   RN Home Health Aide Social work PT       Increase activity slowly    Complete by:  As directed   Do not drive     Increase activity slowly    Complete by:  As directed             Medication List    STOP taking these medications        diazepam 5 MG tablet  Commonly known as:  VALIUM      TAKE these medications        acetaminophen 500 MG tablet  Commonly known as:  TYLENOL  Take 500 mg by mouth every 6 (six) hours as needed.     albuterol 108 (90 BASE) MCG/ACT inhaler  Commonly known as:  PROVENTIL HFA;VENTOLIN HFA  Inhale 2 puffs into the lungs every 6 (six) hours as needed for wheezing  or shortness of breath.     aspirin EC 81 MG tablet  Take 1 tablet (81 mg total) by mouth daily.     citalopram 40 MG tablet  Commonly known as:  CELEXA  Take 1 tablet (40 mg total) by mouth at bedtime.     cyclobenzaprine 5 MG tablet  Commonly known as:  FLEXERIL  Take 1 tablet (5 mg total) by mouth at bedtime as needed for muscle spasms.     feeding supplement (ENSURE COMPLETE) Liqd  Take 237 mLs  by mouth 3 (three) times daily between meals.     levETIRAcetam 1000 MG tablet  Commonly known as:  KEPPRA  Take 1 tablet (1,000 mg total) by mouth 2 (two) times daily.     oxyCODONE-acetaminophen 5-325 MG per tablet  Commonly known as:  PERCOCET/ROXICET  Take 1 tablet by mouth at bedtime as needed for moderate pain or severe pain.       Allergies  Allergen Reactions  . Antihistamines, Diphenhydramine-Type Other (See Comments)    Speeds up heart rate.  . Codeine Nausea And Vomiting   Follow-up Information    Follow up with Cira Servant, DO In 3 weeks.   Specialty:  Neurology   Why:  recurrent seizure   Contact information:   [address] [place] [address] [phone]       Follow up with Eustaquio Boyden, MD In 2 weeks.   Specialty:  Family Medicine   Why:  hospital discharge follow up   Contact information:   [address] [place] [address] [phone]       Follow up with [facility]   Why:  arranged Home Health RN, PT, Nursing  Aide, SW   Contact information:   [address] [place] [address] [phone]       Follow up with Eustaquio Boyden, MD. Schedule an appointment as soon as possible for a visit in 1 week.   Specialty:  Family Medicine   Contact information:   [address] [place] [address] [phone]       Follow up with [facility]. Schedule an appointment as soon as possible for a visit in 1 week.   Why:  seizures   Contact information:    [address] [place] [address] [phone]       The results of significant diagnostics from this hospitalization (including imaging, microbiology, ancillary and laboratory) are listed below for reference.    Significant Diagnostic Studies: Ct Head Wo Contrast  12/07/2014   CLINICAL DATA:  Altered mental status a while mowing the yd, history of seizures, smoking  EXAM: CT HEAD WITHOUT CONTRAST  TECHNIQUE: Contiguous axial images were obtained from the base of the skull through the vertex without intravenous contrast.  COMPARISON:  07/28/2014  FINDINGS: Generalized atrophy.  Normal ventricular morphology.  No midline shift or mass effect.  Normal appearance of brain parenchyma.  No intracranial hemorrhage, mass lesion or evidence acute infarction.  No extra-axial fluid collections.  Sinuses clear and bones unremarkable.  IMPRESSION: No acute intracranial abnormalities.  No interval change.   Electronically Signed   By: Ulyses Southward M.D.   On: 12/07/2014 16:31   Ct Cervical Spine Wo Contrast  12/07/2014   CLINICAL DATA:  Seizure. History of C3 vertebral body fracture. History of prior cervical fusion.  EXAM: CT CERVICAL SPINE WITHOUT CONTRAST  TECHNIQUE: Multidetector CT imaging of the cervical spine was performed without intravenous contrast. Multiplanar CT image reconstructions were also generated.  COMPARISON:  04/30/2014  FINDINGS: Previously identified fracture of the C3 vertebral body is now healed. There is evidence of prior cervical fusion at C4-5 and C5-6 with stable appearance of fusion hardware. The C4-5 levels shows complete bony fusion. The C5-6 level is not completely fused. Appearance is stable. No evidence of listhesis or soft tissue swelling. The visualized airway is normally patent. Emphysematous changes are seen at both lung apices.  IMPRESSION: No acute  fracture identified. Previous C3 fracture shows interval healing. Stable appearance status post prior  cervical fusion at C4-5 and C5-6.   Electronically Signed   By: Irish LackGlenn  Yamagata M.D.   On: 12/07/2014 20:59   Dg Chest Port 1 View  11/28/2014   CLINICAL DATA:  Shortness of breath  EXAM: PORTABLE CHEST - 1 VIEW  COMPARISON:  07/28/2014  FINDINGS: The heart size and mediastinal contours are within normal limits. Both lungs are clear. The visualized skeletal structures are unremarkable. Cervical fusion hardware is noted. Lungs are mildly hyperinflated which may be seen with emphysema.  IMPRESSION: No active disease.   Electronically Signed   By: Christiana PellantGretchen  Green M.D.   On: 11/28/2014 16:09    Microbiology: Recent Results (from the past 240 hour(s))  MRSA PCR Screening     Status: None   Collection Time: 12/07/14  7:39 PM  Result Value Ref Range Status   MRSA by PCR NEGATIVE NEGATIVE Final    Comment:        The GeneXpert MRSA Assay (FDA approved for NASAL specimens only), is one component of a comprehensive MRSA colonization surveillance program. It is not intended to diagnose MRSA infection nor to guide or monitor treatment for MRSA infections.      Labs: Basic Metabolic Panel:  Recent Labs Lab 12/07/14 1532 12/08/14 0350 12/09/14 0230  NA 135 135 137  K 4.1 4.2 4.2  CL 97* 102 98*  CO2 29 28 31   GLUCOSE 87 99 103*  BUN 9 14 21*  CREATININE 0.64 0.69 0.68  CALCIUM 9.1 9.0 8.8*  MG  --  2.0  --    Liver Function Tests:  Recent Labs Lab 12/07/14 1532  AST 24  ALT 16*  ALKPHOS 61  BILITOT 0.8  PROT 6.7  ALBUMIN 4.1   No results for input(s): LIPASE, AMYLASE in the last 168 hours. No results for input(s): AMMONIA in the last 168 hours. CBC:  Recent Labs Lab 12/07/14 1532 12/08/14 0350 12/09/14 0230  WBC 9.0 10.2 11.3*  NEUTROABS 6.6  --   --   HGB 14.1 13.2 12.2*  HCT 41.9 39.5 36.7*  MCV 96.3 97.5 96.8  PLT 183 201 189   Cardiac Enzymes: No results for input(s): CKTOTAL, CKMB, CKMBINDEX, TROPONINI in the last 168 hours. BNP: BNP (last 3  results) No results for input(s): BNP in the last 8760 hours.  ProBNP (last 3 results) No results for input(s): PROBNP in the last 8760 hours.  CBG:  Recent Labs Lab 12/08/14 1153  GLUCAP 133*       Signed:  Leroy SeaSINGH,PRASHANT K MD   [facility] 12/09/2014, 10:41 AM

## 2014-12-08 NOTE — Progress Notes (Signed)
Subjective: No further overnight events. Currently resting comfortably. CT C spine completed, no acute process noted.   Objective: Current vital signs: BP 96/54 mmHg  Pulse 66  Temp(Src) 98.4 F (36.9 C) (Oral)  Resp 15  Ht  (1.803 m)  Wt 49.3 kg (108 lb 11 oz)  BMI 15.17 kg/m2  SpO2 94% Vital signs in last 24 hours: Temp:  [97.7 F (36.5 C)-98.7 F (37.1 C)] 98.4 F (36.9 C) (06/28 0319) Pulse Rate:  [66-100] 66 (06/28 0319) Resp:  [12-24] 15 (06/28 0319) BP: (96-139)/(54-91) 96/54 mmHg (06/28 0319) SpO2:  [91 %-100 %] 94 % (06/28 0319) Weight:  [49.3 kg (108 lb 11 oz)] 49.3 kg (108 lb 11 oz) (06/27 1919)  Intake/Output from previous day: 06/27 0701 - 06/28 0700 In: -  Out: 275 [Urine:275] Intake/Output this shift:   Nutritional status: Diet regular Room service appropriate?: Yes; Fluid consistency:: Thin  Neurologic Exam: Mental Status: Alert, oriented to name only (unsure on date or location), Speech fluent without evidence of aphasia. No dysarthria.  Cranial Nerves: II:  pupils equal, round, reactive to light  III,IV, VI: ptosis not present, extra-ocular motions intact bilaterally V,VII: smile symmetric, facial light touch sensation normal bilaterally VIII: hearing normal bilaterally IX,X: gag reflex present XI: trapezius strength/neck flexion strength normal bilaterally XII: tongue strength normal  Motor: Right :Upper extremityLeft: Upper extremity 5/5 deltoid 5/5 deltoid 5/5 biceps5/5 biceps  5/5 hand grip5/5 hand grip Lower extremityLower extremity 5-/5 hip flexor5-/5 hip  flexor 5/5 plantar flexion 5/5 plantar flexion 5/5 plantar extension5/5 plantar extension Tone and bulk:normal tone throughout; no atrophy noted Sensory: light touch intact throughout, bilaterally  Lab Results: Basic Metabolic Panel:  Recent Labs Lab 12/07/14 1532 12/08/14 0350  NA 135 135  K 4.1 4.2  CL 97* 102  CO2 29 28  GLUCOSE 87 99  BUN 9 14  CREATININE 0.64 0.69  CALCIUM 9.1 9.0  MG  --  2.0    Liver Function Tests:  Recent Labs Lab 12/07/14 1532  AST 24  ALT 16*  ALKPHOS 61  BILITOT 0.8  PROT 6.7  ALBUMIN 4.1   No results for input(s): LIPASE, AMYLASE in the last 168 hours. No results for input(s): AMMONIA in the last 168 hours.  CBC:  Recent Labs Lab 12/07/14 1532 12/08/14 0350  WBC 9.0 10.2  NEUTROABS 6.6  --   HGB 14.1 13.2  HCT 41.9 39.5  MCV 96.3 97.5  PLT 183 201    Cardiac Enzymes: No results for input(s): CKTOTAL, CKMB, CKMBINDEX, TROPONINI in the last 168 hours.  Lipid Panel: No results for input(s): CHOL, TRIG, HDL, CHOLHDL, VLDL, LDLCALC in the last 168 hours.  CBG: No results for input(s): GLUCAP in the last 168 hours.  Microbiology: Results for orders placed or performed during the hospital encounter of 12/07/14  MRSA PCR Screening     Status: None   Collection Time: 12/07/14  7:39 PM  Result Value Ref Range Status   MRSA by PCR NEGATIVE NEGATIVE Final    Comment:        The GeneXpert MRSA Assay (FDA approved for NASAL specimens only), is one component of a comprehensive MRSA colonization surveillance program. It is not intended to diagnose MRSA infection nor to guide or monitor treatment for MRSA infections.     Coagulation Studies: No results for input(s): LABPROT, INR in the last 72 hours.  Imaging: Ct Head Wo Contrast  12/07/2014   CLINICAL DATA:  Altered mental status a while  mowing the yd, history of  seizures, smoking  EXAM: CT HEAD WITHOUT CONTRAST  TECHNIQUE: Contiguous axial images were obtained from the base of the skull through the vertex without intravenous contrast.  COMPARISON:  07/28/2014  FINDINGS: Generalized atrophy.  Normal ventricular morphology.  No midline shift or mass effect.  Normal appearance of brain parenchyma.  No intracranial hemorrhage, mass lesion or evidence acute infarction.  No extra-axial fluid collections.  Sinuses clear and bones unremarkable.  IMPRESSION: No acute intracranial abnormalities.  No interval change.   Electronically Signed   By: Ulyses SouthwardMark  Boles M.D.   On: 12/07/2014 16:31   Ct Cervical Spine Wo Contrast  12/07/2014   CLINICAL DATA:  Seizure. History of C3 vertebral body fracture. History of prior cervical fusion.  EXAM: CT CERVICAL SPINE WITHOUT CONTRAST  TECHNIQUE: Multidetector CT imaging of the cervical spine was performed without intravenous contrast. Multiplanar CT image reconstructions were also generated.  COMPARISON:  04/30/2014  FINDINGS: Previously identified fracture of the C3 vertebral body is now healed. There is evidence of prior cervical fusion at C4-5 and C5-6 with stable appearance of fusion hardware. The C4-5 levels shows complete bony fusion. The C5-6 level is not completely fused. Appearance is stable. No evidence of listhesis or soft tissue swelling. The visualized airway is normally patent. Emphysematous changes are seen at both lung apices.  IMPRESSION: No acute fracture identified. Previous C3 fracture shows interval healing. Stable appearance status post prior cervical fusion at C4-5 and C5-6.   Electronically Signed   By: Irish LackGlenn  Yamagata M.D.   On: 12/07/2014 20:59    Medications:  Scheduled: . aspirin EC  81 mg Oral Daily  . citalopram  40 mg Oral QHS  . enoxaparin (LOVENOX) injection  40 mg Subcutaneous Q24H  . feeding supplement (ENSURE ENLIVE)  237 mL Oral BID  . LORazepam  1 mg Intravenous Once  . nicotine  7 mg Transdermal  Daily    Assessment/Plan:  59y/o gentleman with history of seizures, suspected secondary to benzo withdrawal, presenting with altered mental status. While in ED noted to have brief generalized tonic clonic seizure. Currently appears back to baseline.   -continue keppra 750mg  BID -follow up with outpatient neurologist -patient is unable to drive, operate heavy machinery, perform activities at heights or participate in water activities until release by outpatient physician.  -no further inpatient neurological workup indicated at this time   Elspeth Choeter Kitt Ledet, DO [facility] [phone]  If 7pm- 7am, please page neurology on call as listed in AMION. 12/08/2014  8:12 AM

## 2014-12-08 NOTE — Care Management Note (Signed)
Case Management Note  Patient Details  Name: Corliss MarcusSamuel E Poma Sr. MRN: [id number] Date of Birth: [birth year]  Subjective/Objective:            Pt from home with son admitted with seizure activity, ? Secondary to medication withdrawal from valuim.    Action/Plan: Return to home with Home Health in place when medically stable.   Expected Discharge Date:                  Expected Discharge Plan:  Home w Home Health Services  In-House Referral:     Discharge planning Services  CM Consult  Post Acute Care Choice:    Choice offered to:  [facility] Genevieve Norlander[facility]  DME Arranged:    DME Agency:     HH Arranged:  PT, RN, Nurse's Aide (REFERRAL MADE FOR SW ALSO WITH [facility]) HH [facility]  [facility]  Status of Service:  Completed, signed off  Medicare Important Message Given:    Date Medicare IM Given:    Medicare IM give by:    Date Additional Medicare IM Given:    Additional Medicare Important Message give by:     If discussed at Long Length of Stay Meetings, dates discussed:    Additional Comments: [facility]  [facility]  CM called Corrie DandyMary with [facility]  to confirm relationship and make current referral  for RN,PT,NA and SW. Pt states someone will be with him 24/7 @ home. [facility]  [facility]  Benetta Spar( [place]) for community support per CM, [facility] to [facility]/u with CM with approval. Epifanio Leschesole, Sophonie Goforth Hudson, RN  12/08/2014, 11:09 AM

## 2014-12-08 NOTE — Progress Notes (Signed)
Notified by RN that patient had breakthrough seizure today lasting around 1 minute. Currently post-ictal. Will give 500mg  IV dose of keppra and increase standing dose to 1000mg  BID. If seizure free can be discharged in the morning.     Elspeth Choeter Taylour Lietzke, DO [facility] [phone]  If 7pm- 7am, please page neurology on call as listed in AMION.

## 2014-12-08 NOTE — Progress Notes (Signed)
Initial Nutrition Assessment  DOCUMENTATION CODES:  Severe malnutrition in context of chronic illness, Underweight  INTERVENTION:  Ensure Enlive (each supplement provides 350kcal and 20 grams of protein) (TID)   Encourage adequate PO intake.   NUTRITION DIAGNOSIS:  Malnutrition related to chronic illness as evidenced by severe depletion of body fat, severe depletion of muscle mass, percent weight loss.  GOAL:  Patient will meet greater than or equal to 90% of their needs  MONITOR:  PO intake, Supplement acceptance, Labs, I & O's, Weight trends  REASON FOR ASSESSMENT:  Malnutrition Screening Tool    ASSESSMENT: Pt with history of seizures (suspected secondary to benzo withdrawal), chronic pain presenting with altered mental status. Per EMS, patient noted to be confused, holding and talking into his cell phone charger, noted episodes of staring off without responding. While in ED had ~15 second episode of GTC seizure.   Plans for discharge today, however pt has another witnessed seizure and discharged has been cancelled. During time of visit, pt reports having a good appetite currently and PTA at home consuming at least 3 meals a day and drinking Ensure at least 2-4 times daily. Currently meal completion is 90%. Pt reports having ongoing weight loss. Per Epic weight records, pt with a 12% weight loss in 4 months. Pt reports weight loss may be due to his chronic illnesses. Pt currently has Ensure ordered BID. RD to increase orders to TID to aid in adequate caloric and protein needs as well as in prevention of weight loss. Pt encouraged to eat his food at meals and to drink his supplements.   Nutrition-Focused physical exam completed. Findings are severe fat depletion, severe muscle depletion, and no edema.   Labs and medications reviewed.  Height:  Ht Readings from Last 1 Encounters:  12/07/14 5\' 11"  (1.803 m)    Weight:  Wt Readings from Last 1 Encounters:  12/07/14 108  lb 11 oz (49.3 kg)    Ideal Body Weight:  78 kg  Wt Readings from Last 10 Encounters:  12/07/14 108 lb 11 oz (49.3 kg)  12/01/14 115 lb (52.164 kg)  11/25/14 115 lb (52.164 kg)  07/29/14 123 lb 14.4 oz (56.201 kg)  01/08/14 130 lb 6.4 oz (59.149 kg)  12/28/13 132 lb (59.875 kg)  09/24/13 138 lb 14.4 oz (63.005 kg)    BMI:  Body mass index is 15.17 kg/(m^2). Underweight  Estimated Nutritional Needs:  Kcal:  1900-2100  Protein:  90-100 grams  Fluid:  1.9-2.1 L/day  Skin:  Reviewed, no issues  Diet Order:  Diet - low sodium heart healthy Diet regular Room service appropriate?: Yes; Fluid consistency:: Thin  EDUCATION NEEDS:  Education needs addressed   Intake/Output Summary (Last 24 hours) at 12/08/14 1358 Last data filed at 12/08/14 0300  Gross per 24 hour  Intake      0 ml  Output    275 ml  Net   -275 ml    Last BM:  PTA  Roslyn SmilingStephanie Temia Debroux, MS, RD, LDN Pager # [phone] After hours/ weekend pager # [phone]

## 2014-12-08 NOTE — Progress Notes (Addendum)
Pt had seizure lasting approximately 30-45secs, characterized by jerking of right arm, episode of incontinence and desat into 50's; with an approximate 1min period of difficulty making words prior to episode. Pt placed on 2L O2, paged Dr Roda ShuttersXu, 1mg  Ativan given per one time order. O2 sats back up to 99%. Dr Roda ShuttersXu ordered for discharge to be cancelled and page neurology. Pt now unable to track and unresponsive. Will continue to monitor.

## 2014-12-08 NOTE — Consult Note (Signed)
   [facility]   12/08/2014  Corliss MarcusSamuel E Camps Sr. Aug 14, 1955 [id number]    Referral received from inpatient RNCM. [facility]. [facility] not [facility], after cross checking data it was found and unfortunately, patient's Medicare is not in the delegation for [facility] at this time. Will update inpatient care manager of outcome.  Thank you for the referral.  For questions, please contact: Charlesetta ShanksVictoria Jodi Criscuolo, RN BSN CCM [facility] Liaison  [phone] business mobile phone

## 2014-12-09 ENCOUNTER — Inpatient Hospital Stay (HOSPITAL_COMMUNITY)
Admission: EM | Admit: 2014-12-09 | Discharge: 2014-12-11 | DRG: 101 | Disposition: A | Discharge status: Home Health | Payer: Medicare Other | Attending: Family Medicine | Admitting: Family Medicine

## 2014-12-09 ENCOUNTER — Encounter (HOSPITAL_COMMUNITY): Payer: Self-pay | Admitting: Vascular Surgery

## 2014-12-09 ENCOUNTER — Emergency Department (HOSPITAL_COMMUNITY): Payer: Medicare Other

## 2014-12-09 DIAGNOSIS — F1011 Alcohol abuse, in remission: Secondary | ICD-10-CM

## 2014-12-09 DIAGNOSIS — I959 Hypotension, unspecified: Secondary | ICD-10-CM | POA: Diagnosis not present

## 2014-12-09 DIAGNOSIS — F17210 Nicotine dependence, cigarettes, uncomplicated: Secondary | ICD-10-CM | POA: Diagnosis present

## 2014-12-09 DIAGNOSIS — R64 Cachexia: Secondary | ICD-10-CM | POA: Diagnosis present

## 2014-12-09 DIAGNOSIS — G40409 Other generalized epilepsy and epileptic syndromes, not intractable, without status epilepticus: Principal | ICD-10-CM | POA: Diagnosis present

## 2014-12-09 DIAGNOSIS — F13231 Sedative, hypnotic or anxiolytic dependence with withdrawal delirium: Secondary | ICD-10-CM

## 2014-12-09 DIAGNOSIS — F329 Major depressive disorder, single episode, unspecified: Secondary | ICD-10-CM | POA: Diagnosis present

## 2014-12-09 DIAGNOSIS — E871 Hypo-osmolality and hyponatremia: Secondary | ICD-10-CM | POA: Diagnosis present

## 2014-12-09 DIAGNOSIS — G250 Essential tremor: Secondary | ICD-10-CM | POA: Diagnosis present

## 2014-12-09 DIAGNOSIS — Z981 Arthrodesis status: Secondary | ICD-10-CM | POA: Diagnosis not present

## 2014-12-09 DIAGNOSIS — G40909 Epilepsy, unspecified, not intractable, without status epilepticus: Secondary | ICD-10-CM | POA: Diagnosis present

## 2014-12-09 DIAGNOSIS — Z7982 Long term (current) use of aspirin: Secondary | ICD-10-CM

## 2014-12-09 DIAGNOSIS — J449 Chronic obstructive pulmonary disease, unspecified: Secondary | ICD-10-CM | POA: Diagnosis present

## 2014-12-09 DIAGNOSIS — Z681 Body mass index (BMI) 19 or less, adult: Secondary | ICD-10-CM

## 2014-12-09 DIAGNOSIS — R410 Disorientation, unspecified: Secondary | ICD-10-CM | POA: Insufficient documentation

## 2014-12-09 DIAGNOSIS — Z720 Tobacco use: Secondary | ICD-10-CM | POA: Diagnosis not present

## 2014-12-09 DIAGNOSIS — R569 Unspecified convulsions: Secondary | ICD-10-CM

## 2014-12-09 DIAGNOSIS — Z79899 Other long term (current) drug therapy: Secondary | ICD-10-CM | POA: Diagnosis not present

## 2014-12-09 DIAGNOSIS — R627 Adult failure to thrive: Secondary | ICD-10-CM | POA: Diagnosis present

## 2014-12-09 DIAGNOSIS — I952 Hypotension due to drugs: Secondary | ICD-10-CM | POA: Diagnosis not present

## 2014-12-09 DIAGNOSIS — R059 Cough, unspecified: Secondary | ICD-10-CM

## 2014-12-09 DIAGNOSIS — F0390 Unspecified dementia without behavioral disturbance: Secondary | ICD-10-CM | POA: Diagnosis present

## 2014-12-09 DIAGNOSIS — T424X5A Adverse effect of benzodiazepines, initial encounter: Secondary | ICD-10-CM | POA: Diagnosis not present

## 2014-12-09 DIAGNOSIS — F05 Delirium due to known physiological condition: Secondary | ICD-10-CM

## 2014-12-09 DIAGNOSIS — R05 Cough: Secondary | ICD-10-CM

## 2014-12-09 DIAGNOSIS — G4089 Other seizures: Secondary | ICD-10-CM

## 2014-12-09 DIAGNOSIS — M542 Cervicalgia: Secondary | ICD-10-CM

## 2014-12-09 LAB — COMPREHENSIVE METABOLIC PANEL
ALBUMIN: 3.7 g/dL (ref 3.5–5.0)
ALT: 15 U/L — AB (ref 17–63)
AST: 20 U/L (ref 15–41)
Alkaline Phosphatase: 55 U/L (ref 38–126)
Anion gap: 7 (ref 5–15)
BILIRUBIN TOTAL: 0.9 mg/dL (ref 0.3–1.2)
BUN: 14 mg/dL (ref 6–20)
CHLORIDE: 94 mmol/L — AB (ref 101–111)
CO2: 30 mmol/L (ref 22–32)
Calcium: 8.9 mg/dL (ref 8.9–10.3)
Creatinine, Ser: 0.6 mg/dL — ABNORMAL LOW (ref 0.61–1.24)
GFR calc Af Amer: >60 mL/min (ref >60)
GFR calc non Af Amer: >60 mL/min (ref >60)
Glucose, Bld: 115 mg/dL — ABNORMAL HIGH (ref 65–99)
Potassium: 3.9 mmol/L (ref 3.5–5.1)
Sodium: 131 mmol/L — ABNORMAL LOW (ref 135–145)
Total Protein: 6.2 g/dL — ABNORMAL LOW (ref 6.5–8.1)

## 2014-12-09 LAB — RAPID URINE DRUG SCREEN, HOSP PERFORMED
Amphetamines: NOT DETECTED
BARBITURATES: NOT DETECTED
BENZODIAZEPINES: POSITIVE — AB
Cocaine: NOT DETECTED
OPIATES: NOT DETECTED
Tetrahydrocannabinol: NOT DETECTED

## 2014-12-09 LAB — URINE MICROSCOPIC-ADD ON

## 2014-12-09 LAB — CBC
HCT: 36.7 % — ABNORMAL LOW (ref 39.0–52.0)
HCT: 37.3 % — ABNORMAL LOW (ref 39.0–52.0)
Hemoglobin: 12.2 g/dL — ABNORMAL LOW (ref 13.0–17.0)
Hemoglobin: 12.5 g/dL — ABNORMAL LOW (ref 13.0–17.0)
MCH: 32.2 pg (ref 26.0–34.0)
MCH: 32 pg (ref 26.0–34.0)
MCHC: 33.2 g/dL (ref 30.0–36.0)
MCHC: 33.5 g/dL (ref 30.0–36.0)
MCV: 96.8 fL (ref 78.0–100.0)
MCV: 95.4 fL (ref 78.0–100.0)
PLATELETS: 189 10*3/uL (ref 150–400)
PLATELETS: 169 10*3/uL (ref 150–400)
RBC: 3.79 MIL/uL — ABNORMAL LOW (ref 4.22–5.81)
RBC: 3.91 MIL/uL — ABNORMAL LOW (ref 4.22–5.81)
RDW: 13.3 % (ref 11.5–15.5)
RDW: 12.9 % (ref 11.5–15.5)
WBC: 11.3 10*3/uL — ABNORMAL HIGH (ref 4.0–10.5)
WBC: 12.1 10*3/uL — ABNORMAL HIGH (ref 4.0–10.5)

## 2014-12-09 LAB — URINALYSIS, ROUTINE W REFLEX MICROSCOPIC
Bilirubin Urine: NEGATIVE
GLUCOSE, UA: NEGATIVE mg/dL
KETONES UR: NEGATIVE mg/dL
Leukocytes, UA: NEGATIVE
Nitrite: NEGATIVE
PH: 6 (ref 5.0–8.0)
Protein, ur: NEGATIVE mg/dL
Specific Gravity, Urine: 1.023 (ref 1.005–1.030)
Urobilinogen, UA: 1 mg/dL (ref 0.0–1.0)

## 2014-12-09 LAB — BASIC METABOLIC PANEL
Anion gap: 8 (ref 5–15)
BUN: 21 mg/dL — ABNORMAL HIGH (ref 6–20)
CHLORIDE: 98 mmol/L — AB (ref 101–111)
CO2: 31 mmol/L (ref 22–32)
CREATININE: 0.68 mg/dL (ref 0.61–1.24)
Calcium: 8.8 mg/dL — ABNORMAL LOW (ref 8.9–10.3)
GFR calc Af Amer: >60 mL/min (ref >60)
GFR calc non Af Amer: >60 mL/min (ref >60)
Glucose, Bld: 103 mg/dL — ABNORMAL HIGH (ref 65–99)
Potassium: 4.2 mmol/L (ref 3.5–5.1)
Sodium: 137 mmol/L (ref 135–145)

## 2014-12-09 LAB — ETHANOL: Alcohol, Ethyl (B): <5 mg/dL (ref <5)

## 2014-12-09 LAB — LIPASE, BLOOD: LIPASE: 19 U/L — AB (ref 22–51)

## 2014-12-09 MED ORDER — LORAZEPAM 2 MG/ML IJ SOLN
1.0000 mg | Freq: Once | INTRAMUSCULAR | Status: AC
  Administered 2014-12-09: 1 mg via INTRAVENOUS
  Filled 2014-12-09: qty 1

## 2014-12-09 MED ORDER — SODIUM CHLORIDE 0.9 % IV SOLN
1000.0000 mg | Freq: Once | INTRAVENOUS | Status: AC
  Administered 2014-12-09: 1000 mg via INTRAVENOUS
  Filled 2014-12-09: qty 10

## 2014-12-09 MED ORDER — LEVETIRACETAM 1000 MG PO TABS: 1000.0000 mg | ORAL_TABLET | Freq: Two times a day (BID) | ORAL | Status: DC

## 2014-12-09 NOTE — ED Notes (Signed)
Pt up and walking around in room. Talking again about someone stealing his social security number. He had removed the monitor stating he needed us to be able to see everything. Spoke with son who states the patient has been confused and hallucinating all day.

## 2014-12-09 NOTE — ED Notes (Signed)
Pt reports to the ED via [facility] following a seizure. Pt was recently admitted for the same. He was taking valium for approx 20 years prior to this admission and reports they switched him to Valium. Family stated seizure lasted approx 1 minute. He was post-ictal on scene and remains lethargic. Oriented to time, situation, and person but confused as to place. No urinary incontinence or oral trauma noted. 12 lead shows NSR. Resp e/u and skin warm and dry.
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
## 2014-12-09 NOTE — Progress Notes (Signed)
Discussed AVS and prescriptions with pt and brother-in-law, both verbalize understanding. IV and monitor has been removed, CCMD notified. All personal belongings with pt. Discharged to home.

## 2014-12-09 NOTE — Clinical Social Work Note (Signed)
CSW Consult Acknowledged:   CSW received a consult for "pt's family concerned about living situation." CSW met the pt at the bedside. Pt reported that his family is treating him like a child. Pt informed the CSW that since he is an adult, he can make his own decisions weather good or bad. At this time there are no additional needs. CSW will sign off.       [place], MSW, [place]

## 2014-12-09 NOTE — Discharge Instructions (Signed)
Do not drive, operating heavy machinery, perform activities at heights, swimming or participation in water activities or provide baby sitting services until you have seen by Primary MD or a Neurologist and advised to do so again.  Follow with Primary MD Ronald BoydenJavier Gutierrez, MD in 7 days   Get CBC, CMP, 2 view Chest X ray checked  by Primary MD next visit.    Activity: As tolerated with Full fall precautions use walker/cane & assistance as needed   Disposition Home     Diet: Heart Healthy    For Heart failure patients - Check your Weight same time everyday, if you gain over 2 pounds, or you develop in leg swelling, experience more shortness of breath or chest pain, call your Primary MD immediately. Follow Cardiac Low Salt Diet and 1.5 lit/day fluid restriction.   On your next visit with your primary care physician please Get Medicines reviewed and adjusted.   Please request your Prim.MD to go over all Hospital Tests and Procedure/Radiological results at the follow up, please get all Hospital records sent to your Prim MD by signing hospital release before you go home.   If you experience worsening of your admission symptoms, develop shortness of breath, life threatening emergency, suicidal or homicidal thoughts you must seek medical attention immediately by calling 911 or calling your MD immediately  if symptoms less severe.  You Must read complete instructions/literature along with all the possible adverse reactions/side effects for all the Medicines you take and that have been prescribed to you. Take any new Medicines after you have completely understood and accpet all the possible adverse reactions/side effects.   Do not drive when taking Pain medications.    Do not take more than prescribed Pain, Sleep and Anxiety Medications  Special Instructions: If you have smoked or chewed Tobacco  in the last 2 yrs please stop smoking, stop any regular Alcohol  and or any Recreational drug  use.  Wear Seat belts while driving.   Please note  You were cared for by a hospitalist during your hospital stay. If you have any questions about your discharge medications or the care you received while you were in the hospital after you are discharged, you can call the unit and asked to speak with the hospitalist on call if the hospitalist that took care of you is not available. Once you are discharged, your primary care physician will handle any further medical issues. Please note that NO REFILLS for any discharge medications will be authorized once you are discharged, as it is imperative that you return to your primary care physician (or establish a relationship with a primary care physician if you do not have one) for your aftercare needs so that they can reassess your need for medications and monitor your lab values.
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
## 2014-12-10 ENCOUNTER — Inpatient Hospital Stay (HOSPITAL_COMMUNITY): Payer: Medicare Other

## 2014-12-10 ENCOUNTER — Telehealth: Payer: Self-pay | Admitting: *Deleted

## 2014-12-10 DIAGNOSIS — I959 Hypotension, unspecified: Secondary | ICD-10-CM | POA: Insufficient documentation

## 2014-12-10 DIAGNOSIS — G40909 Epilepsy, unspecified, not intractable, without status epilepticus: Secondary | ICD-10-CM

## 2014-12-10 DIAGNOSIS — J449 Chronic obstructive pulmonary disease, unspecified: Secondary | ICD-10-CM

## 2014-12-10 DIAGNOSIS — R634 Abnormal weight loss: Secondary | ICD-10-CM

## 2014-12-10 DIAGNOSIS — G8929 Other chronic pain: Secondary | ICD-10-CM

## 2014-12-10 DIAGNOSIS — F17200 Nicotine dependence, unspecified, uncomplicated: Secondary | ICD-10-CM

## 2014-12-10 DIAGNOSIS — T424X6D Underdosing of benzodiazepines, subsequent encounter: Secondary | ICD-10-CM

## 2014-12-10 LAB — CBC
HEMATOCRIT: 37 % — AB (ref 39.0–52.0)
HEMOGLOBIN: 12.6 g/dL — AB (ref 13.0–17.0)
MCH: 33 pg (ref 26.0–34.0)
MCHC: 34.1 g/dL (ref 30.0–36.0)
MCV: 96.9 fL (ref 78.0–100.0)
Platelets: 165 10*3/uL (ref 150–400)
RBC: 3.82 MIL/uL — ABNORMAL LOW (ref 4.22–5.81)
RDW: 13.2 % (ref 11.5–15.5)
WBC: 12.2 10*3/uL — AB (ref 4.0–10.5)

## 2014-12-10 LAB — BASIC METABOLIC PANEL
ANION GAP: 9 (ref 5–15)
BUN: 7 mg/dL (ref 6–20)
CALCIUM: 8.9 mg/dL (ref 8.9–10.3)
CO2: 29 mmol/L (ref 22–32)
Chloride: 97 mmol/L — ABNORMAL LOW (ref 101–111)
Creatinine, Ser: 0.52 mg/dL — ABNORMAL LOW (ref 0.61–1.24)
GFR calc Af Amer: >60 mL/min (ref >60)
GFR calc non Af Amer: >60 mL/min (ref >60)
GLUCOSE: 87 mg/dL (ref 65–99)
POTASSIUM: 4.2 mmol/L (ref 3.5–5.1)
Sodium: 135 mmol/L (ref 135–145)

## 2014-12-10 LAB — VITAMIN B12: Vitamin B-12: 309 pg/mL (ref 180–914)

## 2014-12-10 LAB — TROPONIN I: Troponin I: <0.03 ng/mL (ref <0.031)

## 2014-12-10 LAB — HIV ANTIBODY (ROUTINE TESTING W REFLEX): HIV SCREEN 4TH GENERATION: NONREACTIVE

## 2014-12-10 LAB — AMMONIA: AMMONIA: 37 umol/L — AB (ref 9–35)

## 2014-12-10 MED ORDER — ACETAMINOPHEN 650 MG RE SUPP: 650.0000 mg | Freq: Four times a day (QID) | RECTAL | Status: DC | PRN

## 2014-12-10 MED ORDER — SODIUM CHLORIDE 0.9 % IV SOLN
INTRAVENOUS | Status: DC
  Administered 2014-12-10 – 2014-12-11 (×2): via INTRAVENOUS
  Filled 2014-12-10 (×5): qty 1000

## 2014-12-10 MED ORDER — CITALOPRAM HYDROBROMIDE 40 MG PO TABS
40.0000 mg | ORAL_TABLET | Freq: Every day | ORAL | Status: DC
  Administered 2014-12-10 (×2): 40 mg via ORAL
  Filled 2014-12-10 (×2): qty 1

## 2014-12-10 MED ORDER — SODIUM CHLORIDE 0.9 % IJ SOLN
3.0000 mL | Freq: Two times a day (BID) | INTRAMUSCULAR | Status: DC
  Administered 2014-12-10 (×2): 3 mL via INTRAVENOUS

## 2014-12-10 MED ORDER — ENSURE ENLIVE PO LIQD
237.0000 mL | Freq: Three times a day (TID) | ORAL | Status: DC
  Administered 2014-12-10 – 2014-12-11 (×4): 237 mL via ORAL

## 2014-12-10 MED ORDER — DIVALPROEX SODIUM ER 500 MG PO TB24
500.0000 mg | ORAL_TABLET | Freq: Two times a day (BID) | ORAL | Status: DC
Start: 2014-12-10 — End: 2014-12-11
  Administered 2014-12-10 – 2014-12-11 (×3): 500 mg via ORAL
  Filled 2014-12-10 (×4): qty 1

## 2014-12-10 MED ORDER — VITAMIN B-12 1000 MCG PO TABS
1000.0000 ug | ORAL_TABLET | Freq: Every day | ORAL | Status: DC
  Administered 2014-12-10 – 2014-12-11 (×2): 1000 ug via ORAL
  Filled 2014-12-10 (×2): qty 1

## 2014-12-10 MED ORDER — LEVETIRACETAM 500 MG PO TABS
1000.0000 mg | ORAL_TABLET | Freq: Two times a day (BID) | ORAL | Status: DC
  Administered 2014-12-10 – 2014-12-11 (×4): 1000 mg via ORAL
  Filled 2014-12-10 (×5): qty 2

## 2014-12-10 MED ORDER — ACETAMINOPHEN 325 MG PO TABS: 650.0000 mg | ORAL_TABLET | Freq: Four times a day (QID) | ORAL | Status: DC | PRN

## 2014-12-10 MED ORDER — ALBUTEROL SULFATE HFA 108 (90 BASE) MCG/ACT IN AERS: 2.0000 | INHALATION_SPRAY | Freq: Four times a day (QID) | RESPIRATORY_TRACT | Status: DC | PRN

## 2014-12-10 MED ORDER — VALPROATE SODIUM 500 MG/5ML IV SOLN
750.0000 mg | Freq: Once | INTRAVENOUS | Status: AC
  Administered 2014-12-10: 750 mg via INTRAVENOUS
  Filled 2014-12-10: qty 7.5

## 2014-12-10 MED ORDER — GADOBENATE DIMEGLUMINE 529 MG/ML IV SOLN
10.0000 mL | Freq: Once | INTRAVENOUS | Status: AC | PRN
  Administered 2014-12-10: 10 mL via INTRAVENOUS

## 2014-12-10 MED ORDER — OXYCODONE-ACETAMINOPHEN 5-325 MG PO TABS
1.0000 | ORAL_TABLET | Freq: Four times a day (QID) | ORAL | Status: DC | PRN
  Administered 2014-12-10 – 2014-12-11 (×2): 1 via ORAL
  Filled 2014-12-10 (×2): qty 1

## 2014-12-10 MED ORDER — LORAZEPAM 2 MG/ML IJ SOLN: 2.0000 mg | Freq: Four times a day (QID) | INTRAMUSCULAR | Status: DC | PRN

## 2014-12-10 MED ORDER — ENOXAPARIN SODIUM 40 MG/0.4ML ~~LOC~~ SOLN
40.0000 mg | SUBCUTANEOUS | Status: DC
  Administered 2014-12-10 – 2014-12-11 (×2): 40 mg via SUBCUTANEOUS
  Filled 2014-12-10 (×2): qty 0.4

## 2014-12-10 MED ORDER — ONDANSETRON HCL 4 MG/2ML IJ SOLN: 4.0000 mg | Freq: Four times a day (QID) | INTRAMUSCULAR | Status: DC | PRN

## 2014-12-10 MED ORDER — ALBUTEROL SULFATE (2.5 MG/3ML) 0.083% IN NEBU: 2.5000 mg | INHALATION_SOLUTION | Freq: Four times a day (QID) | RESPIRATORY_TRACT | Status: DC | PRN

## 2014-12-10 MED ORDER — ONDANSETRON HCL 4 MG PO TABS: 4.0000 mg | ORAL_TABLET | Freq: Four times a day (QID) | ORAL | Status: DC | PRN

## 2014-12-10 MED ORDER — CYCLOBENZAPRINE HCL 5 MG PO TABS
5.0000 mg | ORAL_TABLET | Freq: Every evening | ORAL | Status: DC | PRN
  Administered 2014-12-10: 5 mg via ORAL
  Filled 2014-12-10: qty 1

## 2014-12-10 MED ORDER — ENSURE COMPLETE PO LIQD: 237.0000 mL | Freq: Three times a day (TID) | ORAL | Status: DC

## 2014-12-10 NOTE — ED Notes (Signed)
Patient transported to X-ray 

## 2014-12-10 NOTE — Procedures (Signed)
History: 59 yo M with seizures  Sedation: None  Technique: This is a 19 channel routine scalp EEG performed at the bedside with bipolar and monopolar montages arranged in accordance to the international 10/20 system of electrode placement. One channel was dedicated to EKG recording.    Background: The background consists of relatively low voltage intermixed alpha and beta activities. There is a well defined posterior dominant rhythm of 8 Hz that attenuates with eye opening. Sleep is recorded with normal appearing structures.   Photic stimulation: Physiologic driving is not performed  EEG Abnormalities: none  Clinical Interpretation: This normal EEG is recorded in the waking and sleep state. There was no seizure or seizure predisposition recorded on this study.   Ritta SlotMcNeill Lasheika Ortloff, MD [facility] [phone]  If 7pm- 7am, please page neurology on call as listed in [facility].

## 2014-12-10 NOTE — Progress Notes (Signed)
Subjective: No overnight events, resting comfortably. No further seizure activity.   MRI brain imaging reviewed and overall unremarkable.  Objective: Current vital signs: BP 97/81 mmHg  Pulse 75  Temp(Src) 97.9 F (36.6 C) (Oral)  Resp 18  Ht 5\' 11"  (1.803 m)  Wt 51.3 kg (113 lb 1.5 oz)  BMI 15.78 kg/m2  SpO2 91% Vital signs in last 24 hours: Temp:  [97.9 F (36.6 C)-98.5 F (36.9 C)] 97.9 F (36.6 C) (06/30 0617) Pulse Rate:  [75-117] 75 (06/30 0617) Resp:  [13-23] 18 (06/30 0617) BP: (96-133)/(52-81) 97/81 mmHg (06/30 0617) SpO2:  [88 %-95 %] 91 % (06/30 0617) Weight:  [51.3 kg (113 lb 1.5 oz)] 51.3 kg (113 lb 1.5 oz) (06/30 0233)  Intake/Output from previous day: 06/29 0701 - 06/30 0700 In: -  Out: 550 [Urine:550] Intake/Output this shift: Total I/O In: -  Out: 375 [Urine:375] Nutritional status: Diet Heart Room service appropriate?: Yes; Fluid consistency:: Thin  Neurologic Exam: Mental Status: Alert, oriented, thought content appropriate. Speech fluent without evidence of aphasia. Able to follow 3 step commands without difficulty. Cranial Nerves: II:  pupils equal, round, reactive to light  III,IV, VI: ptosis not present, extra-ocular motions intact bilaterally V,VII: smile symmetric Motor: Right :Upper extremity 5/5Left: Upper extremity 5/5 Lower extremity 5/5Lower extremity 5/5 Tone and bulk:normal tone throughout; no atrophy noted Sensory: light touch intact throughout, bilaterally  Lab Results: Basic Metabolic Panel:  Recent Labs Lab 12/07/14 1532 12/08/14 0350 12/09/14 0230 12/09/14 2013 12/10/14 0520  NA 135 135 137 131* 135  K 4.1 4.2 4.2 3.9 4.2  CL 97* 102 98* 94* 97*  CO2 29 28 31 30 29   GLUCOSE 87 99 103* 115* 87  BUN 9 14 21* 14 7  CREATININE 0.64 0.69 0.68 0.60* 0.52*  CALCIUM 9.1 9.0 8.8* 8.9 8.9  MG  --  2.0  --   --    --     Liver Function Tests:  Recent Labs Lab 12/07/14 1532 12/09/14 2013  AST 24 20  ALT 16* 15*  ALKPHOS 61 55  BILITOT 0.8 0.9  PROT 6.7 6.2*  ALBUMIN 4.1 3.7    Recent Labs Lab 12/09/14 2013  LIPASE 19*    Recent Labs Lab 12/10/14 0026  AMMONIA 37*    CBC:  Recent Labs Lab 12/07/14 1532 12/08/14 0350 12/09/14 0230 12/09/14 2013 12/10/14 0520  WBC 9.0 10.2 11.3* 12.1* 12.2*  NEUTROABS 6.6  --   --   --   --   HGB 14.1 13.2 12.2* 12.5* 12.6*  HCT 41.9 39.5 36.7* 37.3* 37.0*  MCV 96.3 97.5 96.8 95.4 96.9  PLT 183 201 189 169 165    Cardiac Enzymes:  Recent Labs Lab 12/10/14 0027 12/10/14 0609  TROPONINI <0.03 <0.03    Lipid Panel: No results for input(s): CHOL, TRIG, HDL, CHOLHDL, VLDL, LDLCALC in the last 168 hours.  CBG:  Recent Labs Lab 12/08/14 1153  GLUCAP 133*    Microbiology: Results for orders placed or performed during the hospital encounter of 12/07/14  MRSA PCR Screening     Status: None   Collection Time: 12/07/14  7:39 PM  Result Value Ref Range Status   MRSA by PCR NEGATIVE NEGATIVE Final    Comment:        The GeneXpert MRSA Assay (FDA approved for NASAL specimens only), is one component of a comprehensive MRSA colonization surveillance program. It is not intended to diagnose MRSA infection nor to guide or monitor treatment for  MRSA infections.     Coagulation Studies: No results for input(s): LABPROT, INR in the last 72 hours.  Imaging: Dg Chest 2 View  12/09/2014   CLINICAL DATA:  59 year old male with seizure and altered mental status  EXAM: CHEST  2 VIEW  COMPARISON:  Prior chest x-ray 11/28/2014  FINDINGS: Cardiac and mediastinal contours remain within normal limits. Trace atherosclerotic calcification in the transverse aorta. The lungs remain hyperinflated with increased retrosternal sclera space, areas of hyperlucency and mild bronchitic change. Findings suggest emphysema and COPD. No focal airspace  consolidation, pleural effusion, pneumothorax or pulmonary nodule. No acute osseous abnormality.  IMPRESSION: Stable chest x-ray without evidence of acute cardiopulmonary process.  Emphysema and probable COPD.   Electronically Signed   By: Malachy Moan M.D.   On: 12/09/2014 20:35   Dg Cervical Spine 2 Or 3 Views  12/10/2014   CLINICAL DATA:  Chronic neck pain.  EXAM: CERVICAL SPINE - 2-3 VIEW  COMPARISON:  CT 12/07/2014  FINDINGS: There is anterior interbody fusion with fixation hardware from C4 through C6. The hardware appears intact. No fracture or other acute abnormality is evident. Moderate degenerative disc changes are present at C3-4 with posterior osteophytes. There is no bone lesion or bony destruction.  C7 is not well seen, except for its normal spinous process. It was seen to good advantage on the CT of 12/07/2014, appearing normal except for moderate degenerative disc changes at the C6-7 interspace.  IMPRESSION: Intact appearances of the C4 through C6 interbody fusion with anterior fixation hardware. No acute findings are evident. Moderate degenerative disc changes at C3-4 and at C6-7.   Electronically Signed   By: Ellery Plunk M.D.   On: 12/10/2014 01:11   Mr Laqueta Jean RU Contrast  12/10/2014   CLINICAL DATA:  History of seizures, breakthrough seizures, possible non compliant. Confusion and hallucinations today. History of alcohol abuse, essential tremor.  EXAM: MRI HEAD WITHOUT AND WITH CONTRAST  TECHNIQUE: Multiplanar, multiecho pulse sequences of the brain and surrounding structures were obtained without and with intravenous contrast.  CONTRAST:  10mL MULTIHANCE GADOBENATE DIMEGLUMINE 529 MG/ML IV SOLN  COMPARISON:  CT head December 07, 2014 an MRI of the brain April 10, 2014  FINDINGS: Motion degraded examination. Post gadolinium sequences are moderate to severely motion degraded.  Mild prominence of the ventricles and sulci, similar to prior MRI. No abnormal parenchymal signal, mass  lesions, mass effect. No abnormal parenchymal enhancement. No reduced diffusion to suggest acute ischemia. No susceptibility artifact to suggest hemorrhage. Limited assessment of the mesial temporal lobes due to motion though, hippocampus demonstrate relatively symmetric size and signal.  No abnormal extra-axial fluid collections. No extra-axial masses nor leptomeningeal enhancement. Normal major intracranial vascular flow voids seen at the skull base.  Status post bilateral ocular lens implants. No abnormal sellar expansion. Trace LEFT mastoid effusion, paranasal sinuses are well aerated. No suspicious calvarial bone marrow signal. No abnormal sellar expansion. Craniocervical junction maintained. Patient is edentulous.  IMPRESSION: Motion degraded examination without acute intracranial process or suspicious enhancement.  Mild global parenchymal brain volume loss.   Electronically Signed   By: Awilda Metro M.D.   On: 12/10/2014 05:09    Medications:  Scheduled: . citalopram  40 mg Oral QHS  . divalproex  500 mg Oral BID  . enoxaparin (LOVENOX) injection  40 mg Subcutaneous Q24H  . feeding supplement (ENSURE ENLIVE)  237 mL Oral TID BM  . levETIRAcetam  1,000 mg Oral BID  . sodium chloride  3 mL Intravenous Q12H    Assessment/Plan: 59 year old male presenting with recurrent seizures. Patient has been altered today per son and unclear if he has been compliant with medications. Recurrent breakthrough seizure noted at home.Unclear if altered mental status secondary to the increased dose of Keppra.   Currently back to baseline. MRI brain unremarkable. No further seizure activity. VPA started last night.   -continue keppra 1000mg  BID and VPA 500mg  BID -EEG pending -added daily oral B12 for borderline low level -if EEG unremarkable then no further inpatient neurological workup indicated at this time -will need outpatient neurology follow up in 1-2 weeks. Due to recurrent nature of events would  consider ambulatory EEG to better define nature of episodes -Patient is unable to drive, operate heavy machinery, perform activities at heights or participate in water activities until release by outpatient physician.     LOS: 1 day   Elspeth Choeter Macdonald Rigor, DO [facility] [phone]  If 7pm- 7am, please page neurology on call as listed in AMION. 12/10/2014  8:29 AM

## 2014-12-10 NOTE — Care Management Note (Addendum)
Case Management Note  Patient Details  Name: Ronald MarcusSamuel E Hoelzer Sr. MRN: [id number] Date of Birth: [birth year]  Subjective/Objective:      [facility], and aide, will resume these services.               Action/Plan:   Expected Discharge Date:                  Expected Discharge Plan:  Home w Home Health Services  In-House Referral:     Discharge planning Services  CM Consult  Post Acute Care Choice:  Resumption of Svcs/PTA Provider Choice offered to:     DME Arranged:    DME Agency:     HH Arranged:  RN, PT, Nurse's Aide [facility]  Status of Service:  Completed, signed off  Medicare Important Message Given:    Date Medicare IM Given:    Medicare IM give by:    Date Additional Medicare IM Given:    Additional Medicare Important Message give by:     If discussed at Long Length of Stay Meetings, dates discussed:    Additional Comments:  Ronald [name], Ronald Beirne Clinton, RN 12/10/2014, 3:55 PM

## 2014-12-10 NOTE — Progress Notes (Signed)
Routine adult EEG completed, results pending. 

## 2014-12-10 NOTE — Telephone Encounter (Signed)
Pt's son is calling asking for a letter to be written to his community service/ probation office that he is the main caretaker for his father? Son is [name], he didn't leave a phone number just fax# of where letter needs to be sent [phone]

## 2014-12-10 NOTE — Progress Notes (Signed)
[facility] PROGRESS NOTE  Ronald CORVINO Sr. ZOX:[id number] DOB: 1956/03/23 DOA: 12/09/2014 PCP: Eustaquio Boyden, MD  Assessment/Plan: 59 y/o male with PMH of male with a history of chronic back and neck pain who follows with neurologist Dr. Everlena Cooper for cognitive impairment and essential tremor (on chronic Valium), who had an episode of generalized tonic-clonic seizure in July 2015. He had an MRI of his brain and EEG during that time which were unremarkable. It was thought that his seizure was due to Valium withdrawal, and the patient was not started on any antiepileptics. He did not have any further seizure activity until February 2016. He was admitted to the hospital at that time and subsequently started on Keppra 500 mg twice a day.  -He was admitted again on 12/07/2014 with seizures. He was discharged the morning of 12/09/2014 with Keppra 1000 mg twice a day. He presented on the evening of 12/09/2014 with a witnessed seizure by his family  1 . Recurrent breakthrough seizure. EEG: unremarkable. Head MRI: no acute findings  -neurology recommended to cont keppra, added depakote 500 BID. Currently, patient stable. No seizure overnight. He is concerned about recurrent seizure in 2 days. We will monitor another 24 hours   2. Chronic back and neck pain. Stable. Cont prn pain control  3. Hypotension. Asymptomatic. Cont gentle IVF  4. Probable underlying COPD. H/o tobacco use. No wheezing on exam. Cont prn bronchodilators    Code Status: full  Family Communication: d/w patient  (indicate person spoken with, relationship, and if by phone, the number) Disposition Plan: home 24-48 hrs    Consultants:  Neurology   Procedures:  EEG This normal EEG is recorded in the waking and sleep state. There was no seizure or seizure predisposition recorded on this study.   Antibiotics:  none (indicate start date, and stop date if known)  HPI/Subjective: Alert. Oriented   Objective: Filed  Vitals:   12/10/14 0617  BP: 97/81  Pulse: 75  Temp: 97.9 F (36.6 C)  Resp: 18    Intake/Output Summary (Last 24 hours) at 12/10/14 1256 Last data filed at 12/10/14 1232  Gross per 24 hour  Intake      0 ml  Output   1100 ml  Net  -1100 ml   Filed Weights   12/10/14 0233  Weight: 51.3 kg (113 lb 1.5 oz)    Exam:   General:  No distress   Cardiovascular: s1,s2 rrr  Respiratory: CTA BL  Abdomen: soft, nt,nd   Musculoskeletal: no leg edema   Data Reviewed: Basic Metabolic Panel:  Recent Labs Lab 12/07/14 1532 12/08/14 0350 12/09/14 0230 12/09/14 2013 12/10/14 0520  NA 135 135 137 131* 135  K 4.1 4.2 4.2 3.9 4.2  CL 97* 102 98* 94* 97*  CO2 GLUCOSE 87 99 103* 115* 87  BUN 9 14 21* 14 7  CREATININE 0.64 0.69 0.68 0.60* 0.52*  CALCIUM 9.1 9.0 8.8* 8.9 8.9  MG  --  2.0  --   --   --    Liver Function Tests:  Recent Labs Lab 12/07/14 1532 12/09/14 2013  AST 24 20  ALT 16* 15*  ALKPHOS 61 55  BILITOT 0.8 0.9  PROT 6.7 6.2*  ALBUMIN 4.1 3.7    Recent Labs Lab 12/09/14 2013  LIPASE 19*    Recent Labs Lab 12/10/14 0026  AMMONIA 37*   CBC:  Recent Labs Lab 12/07/14 1532 12/08/14 0350 12/09/14 0230 12/09/14 2013 12/10/14  0520  WBC 9.0 10.2 11.3* 12.1* 12.2*  NEUTROABS 6.6  --   --   --   --   HGB 14.1 13.2 12.2* 12.5* 12.6*  HCT 41.9 39.5 36.7* 37.3* 37.0*  MCV 96.3 97.5 96.8 95.4 96.9  PLT 183 201 189 169 165   Cardiac Enzymes:  Recent Labs Lab 12/10/14 0027 12/10/14 0609  TROPONINI <0.03 <0.03   BNP (last 3 results) No results for input(s): BNP in the last 8760 hours.  ProBNP (last 3 results) No results for input(s): PROBNP in the last 8760 hours.  CBG:  Recent Labs Lab 12/08/14 1153  GLUCAP 133*    Recent Results (from the past 240 hour(s))  MRSA PCR Screening     Status: None   Collection Time: 12/07/14  7:39 PM  Result Value Ref Range Status   MRSA by PCR NEGATIVE NEGATIVE Final     Comment:        The GeneXpert MRSA Assay (FDA approved for NASAL specimens only), is one component of a comprehensive MRSA colonization surveillance program. It is not intended to diagnose MRSA infection nor to guide or monitor treatment for MRSA infections.      Studies: Dg Chest 2 View  12/09/2014   CLINICAL DATA:  60 year old male with seizure and altered mental status  EXAM: CHEST  2 VIEW  COMPARISON:  Prior chest x-ray 11/28/2014  FINDINGS: Cardiac and mediastinal contours remain within normal limits. Trace atherosclerotic calcification in the transverse aorta. The lungs remain hyperinflated with increased retrosternal sclera space, areas of hyperlucency and mild bronchitic change. Findings suggest emphysema and COPD. No focal airspace consolidation, pleural effusion, pneumothorax or pulmonary nodule. No acute osseous abnormality.  IMPRESSION: Stable chest x-ray without evidence of acute cardiopulmonary process.  Emphysema and probable COPD.   Electronically Signed   By: Malachy Moan M.D.   On: 12/09/2014 20:35   Dg Cervical Spine 2 Or 3 Views  12/10/2014   CLINICAL DATA:  Chronic neck pain.  EXAM: CERVICAL SPINE - 2-3 VIEW  COMPARISON:  CT 12/07/2014  FINDINGS: There is anterior interbody fusion with fixation hardware from C4 through C6. The hardware appears intact. No fracture or other acute abnormality is evident. Moderate degenerative disc changes are present at C3-4 with posterior osteophytes. There is no bone lesion or bony destruction.  C7 is not well seen, except for its normal spinous process. It was seen to good advantage on the CT of 12/07/2014, appearing normal except for moderate degenerative disc changes at the C6-7 interspace.  IMPRESSION: Intact appearances of the C4 through C6 interbody fusion with anterior fixation hardware. No acute findings are evident. Moderate degenerative disc changes at C3-4 and at C6-7.   Electronically Signed   By: Ellery Plunk M.D.   On:  12/10/2014 01:11   Mr Laqueta Jean AV Contrast  12/10/2014   CLINICAL DATA:  History of seizures, breakthrough seizures, possible non compliant. Confusion and hallucinations today. History of alcohol abuse, essential tremor.  EXAM: MRI HEAD WITHOUT AND WITH CONTRAST  TECHNIQUE: Multiplanar, multiecho pulse sequences of the brain and surrounding structures were obtained without and with intravenous contrast.  CONTRAST:  10mL MULTIHANCE GADOBENATE DIMEGLUMINE 529 MG/ML IV SOLN  COMPARISON:  CT head December 07, 2014 an MRI of the brain April 10, 2014  FINDINGS: Motion degraded examination. Post gadolinium sequences are moderate to severely motion degraded.  Mild prominence of the ventricles and sulci, similar to prior MRI. No abnormal parenchymal signal, mass lesions, mass effect. No  abnormal parenchymal enhancement. No reduced diffusion to suggest acute ischemia. No susceptibility artifact to suggest hemorrhage. Limited assessment of the mesial temporal lobes due to motion though, hippocampus demonstrate relatively symmetric size and signal.  No abnormal extra-axial fluid collections. No extra-axial masses nor leptomeningeal enhancement. Normal major intracranial vascular flow voids seen at the skull base.  Status post bilateral ocular lens implants. No abnormal sellar expansion. Trace LEFT mastoid effusion, paranasal sinuses are well aerated. No suspicious calvarial bone marrow signal. No abnormal sellar expansion. Craniocervical junction maintained. Patient is edentulous.  IMPRESSION: Motion degraded examination without acute intracranial process or suspicious enhancement.  Mild global parenchymal brain volume loss.   Electronically Signed   By: Awilda Metroourtnay  Bloomer M.D.   On: 12/10/2014 05:09    Scheduled Meds: . citalopram  40 mg Oral QHS  . divalproex  500 mg Oral BID  . enoxaparin (LOVENOX) injection  40 mg Subcutaneous Q24H  . feeding supplement (ENSURE ENLIVE)  237 mL Oral TID BM  . levETIRAcetam  1,000 mg  Oral BID  . sodium chloride  3 mL Intravenous Q12H  . vitamin B-12  1,000 mcg Oral Daily   Continuous Infusions: . sodium chloride 0.9 % 1,000 mL with potassium chloride 20 mEq infusion 75 mL/hr at 12/10/14 0134    Active Problems:   Recurrent seizures   Hyponatremia    Time spent: >35 minutes     Esperanza SheetsBURIEV, Ronald [name]  [facility] Pager [phone]. If 7PM-7AM, please contact night-coverage at [email or web address], password [email or web address] 12/10/2014, 12:56 PM  LOS: 1 day

## 2014-12-10 NOTE — Telephone Encounter (Signed)
Letter written and in [facility] box. 

## 2014-12-10 NOTE — Telephone Encounter (Signed)
Letter faxed to # provided 
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
## 2014-12-11 ENCOUNTER — Telehealth: Payer: Self-pay | Admitting: Family Medicine

## 2014-12-11 ENCOUNTER — Other Ambulatory Visit: Payer: Self-pay | Admitting: Family Medicine

## 2014-12-11 LAB — AMMONIA: Ammonia: 16 umol/L (ref 9–35)

## 2014-12-11 MED ORDER — DIVALPROEX SODIUM 500 MG PO DR TAB: 500.0000 mg | DELAYED_RELEASE_TABLET | Freq: Two times a day (BID) | ORAL | Status: DC

## 2014-12-11 MED ORDER — OXYCODONE-ACETAMINOPHEN 5-325 MG PO TABS: 1.0000 | ORAL_TABLET | Freq: Four times a day (QID) | ORAL | Status: DC | PRN

## 2014-12-11 MED ORDER — CYANOCOBALAMIN 1000 MCG PO TABS: 1000.0000 ug | ORAL_TABLET | Freq: Every day | ORAL | Status: DC

## 2014-12-11 NOTE — Progress Notes (Signed)
Patient discharge teaching given, including activity, diet, follow-up appoints, and medications. Patient verbalized understanding of all discharge instructions. IV access was d/c'd. Vitals are stable. Skin is intact except as charted in most recent assessments. Pt to be escorted out by NT, to be driven home by family. 

## 2014-12-11 NOTE — Discharge Summary (Signed)
Physician Discharge Summary  Ronald STUTZ Sr. KGM:[id number] DOB: [birth year] DOA: 12/09/2014  PCP: Eustaquio Boyden, MD  Admit date: 12/09/2014 Discharge date: 12/11/2014  Time spent: 35 minutes  Recommendations for Outpatient Follow-up:  1. Patient compliance to be enforced 2. Ambulatory EEG being set up by Neuro 3. Needs bmet/mag/cbc 1 week  4. Consider OP work up of wght loss  5. recume HH c [facility]  Discharge Diagnoses:  Active Problems:   Recurrent seizures   Hyponatremia   Arterial hypotension   COPD bronchitis   Discharge Condition: fair  Diet recommendation:  hh low salt  Filed Weights   12/10/14 0233  Weight: 51.3 kg (113 lb 1.5 oz)    History of present illness:  59 yr old male admitted jultiple x's in past 1 week with recurrent sz's  history of chronic back and neck pain who follows with neurologist Dr. Everlena Cooper for cognitive impairment and essential tremor (on chronic Valium), who had an episode of generalized tonic-clonic seizure in July 2015.  He had an MRI of his brain and EEG during that time which were unremarkable. It was thought that his seizure was due to Valium withdrawal, and the patient was not started on any antiepileptics.  He did not have any further seizure activity until February 2016.  He was admitted to the hospital at that time and subsequently started on Keppra 500 mg twice a day.  -He was admitted again on 12/07/2014 with seizures. He was discharged the morning of 12/09/2014 with Keppra 1000 mg twice a day. He presented on the evening of 12/09/2014 with a witnessed seizure by his family  Hospital Course:   1 . Recurrent breakthrough seizure.  EEG: unremarkable.  Head MRI: no acute findings  -neurology recommended to cont keppra 1000 mg bid, added depakote 500 BID.  Currently, patient stable. No seizure overnight. He is concerned about recurrent seizure in 2 days.  We will monitor another 24 hours   2. Chronic back and neck pain. Stable.  Cont prn pain control-Zanaflex held on  3. Hypotension. Asymptomatic. Cont gentle IVF  4. Probable underlying COPD. H/o tobacco use. No wheezing on exam. Cont prn bronchodilators   Procedures: EEG Abnormalities: none  Clinical Interpretation: This normal EEG is recorded in the waking and sleep state. There was no seizure or seizure predisposition recorded on this study.    Consultations:  Neurology  Discharge Exam: Filed Vitals:   12/11/14 0557  BP: 91/62  Pulse: 59  Temp: 98.5 F (36.9 C)  Resp: 14    General: eomi, ncat  Cardiovascular: s1 s2 no m/r/g Respiratory: cta B  Discharge Instructions   Discharge Instructions    Diet - low sodium heart healthy    Complete by:  As directed      Discharge instructions    Complete by:  As directed   DO NOT TAKE FLEXERIL Start depakote 500 twice a day in addition to your Keppra 100 bid No driving nor heavy machinery use as Op Dr. Morton Stall to look into your weight loss     Increase activity slowly    Complete by:  As directed           Current Discharge Medication List    START taking these medications   Details  divalproex (DEPAKOTE) 500 MG DR tablet Take 1 tablet (500 mg total) by mouth 2 (two) times daily. Qty: 60 tablet, Refills: 0    vitamin B-12 1000 MCG tablet Take 1 tablet (1,000 mcg total) by  mouth daily. Qty: 60 tablet, Refills: 0      CONTINUE these medications which have NOT CHANGED   Details  acetaminophen (TYLENOL) 500 MG tablet Take 500 mg by mouth every 6 (six) hours as needed for moderate pain.     albuterol (PROVENTIL HFA;VENTOLIN HFA) 108 (90 BASE) MCG/ACT inhaler Inhale 2 puffs into the lungs every 6 (six) hours as needed for wheezing or shortness of breath. Qty: 1 Inhaler, Refills: 2    citalopram (CELEXA) 40 MG tablet Take 1 tablet (40 mg total) by mouth at bedtime. Qty: 30 tablet, Refills: 3    feeding supplement, ENSURE COMPLETE, (ENSURE COMPLETE) LIQD Take 237 mLs by mouth 3 (three)  times daily between meals. Qty: 30 Bottle, Refills: 3    levETIRAcetam (KEPPRA) 1000 MG tablet Take 1 tablet (1,000 mg total) by mouth 2 (two) times daily. Qty: 60 tablet, Refills: 1    oxyCODONE-acetaminophen (PERCOCET/ROXICET) 5-325 MG per tablet Take 1 tablet by mouth at bedtime as needed for moderate pain or severe pain. Qty: 10 tablet, Refills: 0    aspirin EC 81 MG tablet Take 1 tablet (81 mg total) by mouth daily. Qty: 30 tablet, Refills: 0      STOP taking these medications     cyclobenzaprine (FLEXERIL) 5 MG tablet        Allergies  Allergen Reactions  . Antihistamines, Diphenhydramine-Type Other (See Comments)    Speeds up heart rate.  . Codeine Nausea And Vomiting   Follow-up Information    Follow up with [facility].   Why:  HHRN, PT, aide   Contact information:   [address] [place] [address] [phone]        The results of significant diagnostics from this hospitalization (including imaging, microbiology, ancillary and laboratory) are listed below for reference.    Significant Diagnostic Studies: Dg Chest 2 View  12/09/2014   CLINICAL DATA:  59 year old male with seizure and altered mental status  EXAM: CHEST  2 VIEW  COMPARISON:  Prior chest x-ray 11/28/2014  FINDINGS: Cardiac and mediastinal contours remain within normal limits. Trace atherosclerotic calcification in the transverse aorta. The lungs remain hyperinflated with increased retrosternal sclera space, areas of hyperlucency and mild bronchitic change. Findings suggest emphysema and COPD. No focal airspace consolidation, pleural effusion, pneumothorax or pulmonary nodule. No acute osseous abnormality.  IMPRESSION: Stable chest x-ray without evidence of acute cardiopulmonary process.  Emphysema and probable COPD.   Electronically Signed   By: Malachy Moan M.D.   On: 12/09/2014 20:35   Dg Cervical Spine 2 Or 3 Views  12/10/2014   CLINICAL DATA:  Chronic neck pain.   EXAM: CERVICAL SPINE - 2-3 VIEW  COMPARISON:  CT 12/07/2014  FINDINGS: There is anterior interbody fusion with fixation hardware from C4 through C6. The hardware appears intact. No fracture or other acute abnormality is evident. Moderate degenerative disc changes are present at C3-4 with posterior osteophytes. There is no bone lesion or bony destruction.  C7 is not well seen, except for its normal spinous process. It was seen to good advantage on the CT of 12/07/2014, appearing normal except for moderate degenerative disc changes at the C6-7 interspace.  IMPRESSION: Intact appearances of the C4 through C6 interbody fusion with anterior fixation hardware. No acute findings are evident. Moderate degenerative disc changes at C3-4 and at C6-7.   Electronically Signed   By: Ellery Plunk M.D.   On: 12/10/2014 01:11   Ct Head Wo Contrast  12/07/2014   CLINICAL DATA:  Altered mental status a while mowing the yd, history of seizures, smoking  EXAM: CT HEAD WITHOUT CONTRAST  TECHNIQUE: Contiguous axial images were obtained from the base of the skull through the vertex without intravenous contrast.  COMPARISON:  07/28/2014  FINDINGS: Generalized atrophy.  Normal ventricular morphology.  No midline shift or mass effect.  Normal appearance of brain parenchyma.  No intracranial hemorrhage, mass lesion or evidence acute infarction.  No extra-axial fluid collections.  Sinuses clear and bones unremarkable.  IMPRESSION: No acute intracranial abnormalities.  No interval change.   Electronically Signed   By: Ulyses Southward M.D.   On: 12/07/2014 16:31   Ct Cervical Spine Wo Contrast  12/07/2014   CLINICAL DATA:  Seizure. History of C3 vertebral body fracture. History of prior cervical fusion.  EXAM: CT CERVICAL SPINE WITHOUT CONTRAST  TECHNIQUE: Multidetector CT imaging of the cervical spine was performed without intravenous contrast. Multiplanar CT image reconstructions were also generated.  COMPARISON:  04/30/2014  FINDINGS:  Previously identified fracture of the C3 vertebral body is now healed. There is evidence of prior cervical fusion at C4-5 and C5-6 with stable appearance of fusion hardware. The C4-5 levels shows complete bony fusion. The C5-6 level is not completely fused. Appearance is stable. No evidence of listhesis or soft tissue swelling. The visualized airway is normally patent. Emphysematous changes are seen at both lung apices.  IMPRESSION: No acute fracture identified. Previous C3 fracture shows interval healing. Stable appearance status post prior cervical fusion at C4-5 and C5-6.   Electronically Signed   By: Irish Lack M.D.   On: 12/07/2014 20:59   Mr Laqueta Jean ZO Contrast  12/10/2014   CLINICAL DATA:  History of seizures, breakthrough seizures, possible non compliant. Confusion and hallucinations today. History of alcohol abuse, essential tremor.  EXAM: MRI HEAD WITHOUT AND WITH CONTRAST  TECHNIQUE: Multiplanar, multiecho pulse sequences of the brain and surrounding structures were obtained without and with intravenous contrast.  CONTRAST:  10mL MULTIHANCE GADOBENATE DIMEGLUMINE 529 MG/ML IV SOLN  COMPARISON:  CT head December 07, 2014 an MRI of the brain April 10, 2014  FINDINGS: Motion degraded examination. Post gadolinium sequences are moderate to severely motion degraded.  Mild prominence of the ventricles and sulci, similar to prior MRI. No abnormal parenchymal signal, mass lesions, mass effect. No abnormal parenchymal enhancement. No reduced diffusion to suggest acute ischemia. No susceptibility artifact to suggest hemorrhage. Limited assessment of the mesial temporal lobes due to motion though, hippocampus demonstrate relatively symmetric size and signal.  No abnormal extra-axial fluid collections. No extra-axial masses nor leptomeningeal enhancement. Normal major intracranial vascular flow voids seen at the skull base.  Status post bilateral ocular lens implants. No abnormal sellar expansion. Trace LEFT  mastoid effusion, paranasal sinuses are well aerated. No suspicious calvarial bone marrow signal. No abnormal sellar expansion. Craniocervical junction maintained. Patient is edentulous.  IMPRESSION: Motion degraded examination without acute intracranial process or suspicious enhancement.  Mild global parenchymal brain volume loss.   Electronically Signed   By: Awilda Metro M.D.   On: 12/10/2014 05:09   Dg Chest Port 1 View  11/28/2014   CLINICAL DATA:  Shortness of breath  EXAM: PORTABLE CHEST - 1 VIEW  COMPARISON:  07/28/2014  FINDINGS: The heart size and mediastinal contours are within normal limits. Both lungs are clear. The visualized skeletal structures are unremarkable. Cervical fusion hardware is noted. Lungs are mildly hyperinflated which may be seen with emphysema.  IMPRESSION: No  active disease.   Electronically Signed   By: Christiana PellantGretchen  Green M.D.   On: 11/28/2014 16:09    Microbiology: Recent Results (from the past 240 hour(s))  MRSA PCR Screening     Status: None   Collection Time: 12/07/14  7:39 PM  Result Value Ref Range Status   MRSA by PCR NEGATIVE NEGATIVE Final    Comment:        The GeneXpert MRSA Assay (FDA approved for NASAL specimens only), is one component of a comprehensive MRSA colonization surveillance program. It is not intended to diagnose MRSA infection nor to guide or monitor treatment for MRSA infections.      Labs: Basic Metabolic Panel:  Recent Labs Lab 12/07/14 1532 12/08/14 0350 12/09/14 0230 12/09/14 2013 12/10/14 0520  NA 135 135 137 131* 135  K 4.1 4.2 4.2 3.9 4.2  CL 97* 102 98* 94* 97*  CO2 29 28 31 30 29   GLUCOSE 87 99 103* 115* 87  BUN 9 14 21* 14 7  CREATININE 0.64 0.69 0.68 0.60* 0.52*  CALCIUM 9.1 9.0 8.8* 8.9 8.9  MG  --  2.0  --   --   --    Liver Function Tests:  Recent Labs Lab 12/07/14 1532 12/09/14 2013  AST 24 20  ALT 16* 15*  ALKPHOS 61 55  BILITOT 0.8 0.9  PROT 6.7 6.2*  ALBUMIN 4.1 3.7    Recent  Labs Lab 12/09/14 2013  LIPASE 19*    Recent Labs Lab 12/10/14 0026 12/11/14 0433  AMMONIA 37* 16   CBC:  Recent Labs Lab 12/07/14 1532 12/08/14 0350 12/09/14 0230 12/09/14 2013 12/10/14 0520  WBC 9.0 10.2 11.3* 12.1* 12.2*  NEUTROABS 6.6  --   --   --   --   HGB 14.1 13.2 12.2* 12.5* 12.6*  HCT 41.9 39.5 36.7* 37.3* 37.0*  MCV 96.3 97.5 96.8 95.4 96.9  PLT 183 201 189 169 165   Cardiac Enzymes:  Recent Labs Lab 12/10/14 0027 12/10/14 0609 12/10/14 1202  TROPONINI <0.03 <0.03 <0.03   BNP: BNP (last 3 results) No results for input(s): BNP in the last 8760 hours.  ProBNP (last 3 results) No results for input(s): PROBNP in the last 8760 hours.  CBG:  Recent Labs Lab 12/08/14 1153  GLUCAP 133*       SignedRhetta Mura:  Mayana Irigoyen, JAI-GURMUKH  [facility] 12/11/2014, 9:47 AM

## 2014-12-11 NOTE — Care Management Note (Signed)
Case Management Note  Patient Details  Name: Ronald MarcusSamuel E Seldon Sr. MRN: [id number] Date of Birth: 1955/11/16  Subjective/Objective:   Patient for dc today, [facility]                 Action/Plan:   Expected Discharge Date:                  Expected Discharge Plan:  Home w Home Health Services  In-House Referral:     Discharge planning Services  CM Consult  Post Acute Care Choice:  Resumption of Svcs/PTA Provider Choice offered to:     DME Arranged:    DME Agency:     HH Arranged:  RN, PT, Nurse's Aide [facility]  Status of Service:  Completed, signed off  Medicare Important Message Given:    Date Medicare IM Given:    Medicare IM give by:    Date Additional Medicare IM Given:    Additional Medicare Important Message give by:     If discussed at Long Length of Stay Meetings, dates discussed:    Additional Comments:  Leone Havenaylor, Denim Kalmb[place], RN 12/11/2014, 10:12 AM
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
## 2014-12-12 ENCOUNTER — Encounter (HOSPITAL_COMMUNITY): Payer: Self-pay | Admitting: *Deleted

## 2014-12-12 ENCOUNTER — Encounter (HOSPITAL_COMMUNITY): Payer: Self-pay | Admitting: Emergency Medicine

## 2014-12-12 ENCOUNTER — Emergency Department (EMERGENCY_DEPARTMENT_HOSPITAL)
Admission: EM | Admit: 2014-12-12 | Discharge: 2014-12-12 | Disposition: A | Discharge status: Home / Self Care | Payer: Medicare Other | Source: Home / Self Care | Attending: Emergency Medicine | Admitting: Emergency Medicine

## 2014-12-12 ENCOUNTER — Emergency Department (HOSPITAL_COMMUNITY)
Admission: EM | Admit: 2014-12-12 | Discharge: 2014-12-12 | Disposition: A | Discharge status: Home / Self Care | Payer: Medicare Other | Source: Home / Self Care | Attending: Emergency Medicine | Admitting: Emergency Medicine

## 2014-12-12 ENCOUNTER — Inpatient Hospital Stay (HOSPITAL_COMMUNITY)
Admission: EM | Admit: 2014-12-12 | Discharge: 2014-12-16 | DRG: 100 | Disposition: A | Discharge status: Skilled Nursing Facility | Payer: Medicare Other | Attending: Internal Medicine | Admitting: Internal Medicine

## 2014-12-12 DIAGNOSIS — Z8619 Personal history of other infectious and parasitic diseases: Secondary | ICD-10-CM | POA: Insufficient documentation

## 2014-12-12 DIAGNOSIS — Z609 Problem related to social environment, unspecified: Secondary | ICD-10-CM

## 2014-12-12 DIAGNOSIS — Z79899 Other long term (current) drug therapy: Secondary | ICD-10-CM | POA: Insufficient documentation

## 2014-12-12 DIAGNOSIS — Z7982 Long term (current) use of aspirin: Secondary | ICD-10-CM

## 2014-12-12 DIAGNOSIS — M4806 Spinal stenosis, lumbar region: Secondary | ICD-10-CM | POA: Diagnosis present

## 2014-12-12 DIAGNOSIS — F329 Major depressive disorder, single episode, unspecified: Secondary | ICD-10-CM | POA: Insufficient documentation

## 2014-12-12 DIAGNOSIS — G40409 Other generalized epilepsy and epileptic syndromes, not intractable, without status epilepticus: Principal | ICD-10-CM | POA: Diagnosis present

## 2014-12-12 DIAGNOSIS — Z981 Arthrodesis status: Secondary | ICD-10-CM

## 2014-12-12 DIAGNOSIS — R4189 Other symptoms and signs involving cognitive functions and awareness: Secondary | ICD-10-CM | POA: Diagnosis present

## 2014-12-12 DIAGNOSIS — K08109 Complete loss of teeth, unspecified cause, unspecified class: Secondary | ICD-10-CM | POA: Insufficient documentation

## 2014-12-12 DIAGNOSIS — R062 Wheezing: Secondary | ICD-10-CM

## 2014-12-12 DIAGNOSIS — Z720 Tobacco use: Secondary | ICD-10-CM | POA: Diagnosis present

## 2014-12-12 DIAGNOSIS — Z659 Problem related to unspecified psychosocial circumstances: Secondary | ICD-10-CM

## 2014-12-12 DIAGNOSIS — F17210 Nicotine dependence, cigarettes, uncomplicated: Secondary | ICD-10-CM | POA: Diagnosis present

## 2014-12-12 DIAGNOSIS — R55 Syncope and collapse: Secondary | ICD-10-CM | POA: Insufficient documentation

## 2014-12-12 DIAGNOSIS — E43 Unspecified severe protein-calorie malnutrition: Secondary | ICD-10-CM | POA: Insufficient documentation

## 2014-12-12 DIAGNOSIS — R059 Cough, unspecified: Secondary | ICD-10-CM

## 2014-12-12 DIAGNOSIS — G8929 Other chronic pain: Secondary | ICD-10-CM

## 2014-12-12 DIAGNOSIS — G40909 Epilepsy, unspecified, not intractable, without status epilepticus: Secondary | ICD-10-CM | POA: Insufficient documentation

## 2014-12-12 DIAGNOSIS — J690 Pneumonitis due to inhalation of food and vomit: Secondary | ICD-10-CM | POA: Diagnosis present

## 2014-12-12 DIAGNOSIS — Z885 Allergy status to narcotic agent status: Secondary | ICD-10-CM

## 2014-12-12 DIAGNOSIS — Z8669 Personal history of other diseases of the nervous system and sense organs: Secondary | ICD-10-CM | POA: Insufficient documentation

## 2014-12-12 DIAGNOSIS — R569 Unspecified convulsions: Secondary | ICD-10-CM

## 2014-12-12 DIAGNOSIS — F13239 Sedative, hypnotic or anxiolytic dependence with withdrawal, unspecified: Secondary | ICD-10-CM | POA: Diagnosis present

## 2014-12-12 DIAGNOSIS — Z888 Allergy status to other drugs, medicaments and biological substances status: Secondary | ICD-10-CM

## 2014-12-12 DIAGNOSIS — F1011 Alcohol abuse, in remission: Secondary | ICD-10-CM | POA: Diagnosis present

## 2014-12-12 DIAGNOSIS — F1010 Alcohol abuse, uncomplicated: Secondary | ICD-10-CM | POA: Diagnosis present

## 2014-12-12 DIAGNOSIS — R05 Cough: Secondary | ICD-10-CM

## 2014-12-12 DIAGNOSIS — J440 Chronic obstructive pulmonary disease with acute lower respiratory infection: Secondary | ICD-10-CM | POA: Diagnosis present

## 2014-12-12 LAB — BASIC METABOLIC PANEL
Anion gap: 11 (ref 5–15)
BUN: 22 mg/dL — ABNORMAL HIGH (ref 6–20)
CO2: 29 mmol/L (ref 22–32)
CREATININE: 0.56 mg/dL — AB (ref 0.61–1.24)
Calcium: 9.5 mg/dL (ref 8.9–10.3)
Chloride: 99 mmol/L — ABNORMAL LOW (ref 101–111)
GFR calc Af Amer: >60 mL/min (ref >60)
GFR calc non Af Amer: >60 mL/min (ref >60)
Glucose, Bld: 100 mg/dL — ABNORMAL HIGH (ref 65–99)
POTASSIUM: 4.2 mmol/L (ref 3.5–5.1)
Sodium: 139 mmol/L (ref 135–145)

## 2014-12-12 LAB — CBG MONITORING, ED
Glucose-Capillary: 111 mg/dL — ABNORMAL HIGH (ref 65–99)
Glucose-Capillary: 101 mg/dL — ABNORMAL HIGH (ref 65–99)

## 2014-12-12 LAB — COMPREHENSIVE METABOLIC PANEL
ALK PHOS: 56 U/L (ref 38–126)
ALT: 16 U/L — ABNORMAL LOW (ref 17–63)
AST: 24 U/L (ref 15–41)
Albumin: 3.3 g/dL — ABNORMAL LOW (ref 3.5–5.0)
Anion gap: 11 (ref 5–15)
BILIRUBIN TOTAL: 0.6 mg/dL (ref 0.3–1.2)
BUN: 10 mg/dL (ref 6–20)
CO2: 27 mmol/L (ref 22–32)
CREATININE: 0.52 mg/dL — AB (ref 0.61–1.24)
Calcium: 9.1 mg/dL (ref 8.9–10.3)
Chloride: 98 mmol/L — ABNORMAL LOW (ref 101–111)
GLUCOSE: 109 mg/dL — AB (ref 65–99)
Potassium: 3.9 mmol/L (ref 3.5–5.1)
Sodium: 136 mmol/L (ref 135–145)
Total Protein: 6 g/dL — ABNORMAL LOW (ref 6.5–8.1)

## 2014-12-12 LAB — CBC WITH DIFFERENTIAL/PLATELET
BASOS ABS: 0 10*3/uL (ref 0.0–0.1)
Basophils Relative: 0 % (ref 0–1)
Eosinophils Absolute: 0.3 10*3/uL (ref 0.0–0.7)
Eosinophils Relative: 3 % (ref 0–5)
HEMATOCRIT: 36.8 % — AB (ref 39.0–52.0)
Hemoglobin: 12.3 g/dL — ABNORMAL LOW (ref 13.0–17.0)
Lymphocytes Relative: 19 % (ref 12–46)
Lymphs Abs: 1.5 10*3/uL (ref 0.7–4.0)
MCH: 32.1 pg (ref 26.0–34.0)
MCHC: 33.4 g/dL (ref 30.0–36.0)
MCV: 96.1 fL (ref 78.0–100.0)
MONO ABS: 0.6 10*3/uL (ref 0.1–1.0)
Monocytes Relative: 7 % (ref 3–12)
NEUTROS PCT: 71 % (ref 43–77)
Neutro Abs: 5.7 10*3/uL (ref 1.7–7.7)
PLATELETS: 195 10*3/uL (ref 150–400)
RBC: 3.83 MIL/uL — AB (ref 4.22–5.81)
RDW: 13.2 % (ref 11.5–15.5)
WBC: 8.1 10*3/uL (ref 4.0–10.5)

## 2014-12-12 LAB — CBC
HCT: 36.4 % — ABNORMAL LOW (ref 39.0–52.0)
Hemoglobin: 12.4 g/dL — ABNORMAL LOW (ref 13.0–17.0)
MCH: 33.1 pg (ref 26.0–34.0)
MCHC: 34.1 g/dL (ref 30.0–36.0)
MCV: 97.1 fL (ref 78.0–100.0)
PLATELETS: 202 10*3/uL (ref 150–400)
RBC: 3.75 MIL/uL — ABNORMAL LOW (ref 4.22–5.81)
RDW: 13.1 % (ref 11.5–15.5)
WBC: 11 10*3/uL — ABNORMAL HIGH (ref 4.0–10.5)

## 2014-12-12 LAB — ETHANOL: Alcohol, Ethyl (B): <5 mg/dL (ref <5)

## 2014-12-12 LAB — VALPROIC ACID LEVEL
Valproic Acid Lvl: 55 ug/mL (ref 50.0–100.0)
Valproic Acid Lvl: 73 ug/mL (ref 50.0–100.0)

## 2014-12-12 MED ORDER — SODIUM CHLORIDE 0.9 % IV BOLUS (SEPSIS)
1000.0000 mL | Freq: Once | INTRAVENOUS | Status: AC
  Administered 2014-12-12: 1000 mL via INTRAVENOUS

## 2014-12-12 MED ORDER — DIVALPROEX SODIUM 250 MG PO DR TAB: 250.0000 mg | DELAYED_RELEASE_TABLET | Freq: Three times a day (TID) | ORAL | Status: DC

## 2014-12-12 MED ORDER — VALPROATE SODIUM 500 MG/5ML IV SOLN
1000.0000 mg | Freq: Once | INTRAVENOUS | Status: AC
  Administered 2014-12-12: 1000 mg via INTRAVENOUS
  Filled 2014-12-12: qty 10

## 2014-12-12 NOTE — ED Notes (Signed)
Per EMS:  Pt d/c'd earlier today from this location for seizures.  Unable to find cause at this time, but pt recently removed from valium approx 1 month ago after 3 years of use.  Pt incontinent during episode.  Witnessed by family member and pt had the seizure in his chair at home, did not fall.  CBG - 96.  EMS sts pt post-ictal at time of arrival, but a&o in room at this time.
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
## 2014-12-12 NOTE — ED Notes (Signed)
Pt asked RN to call his daughter for a ride home, she is unable to pick him up will try to find somebody to come and get him.  RN will call back in 15 minutes.

## 2014-12-12 NOTE — ED Notes (Signed)
EMS contacted by patients son whom reports pt experienced seizure. Pt awoke around 1pm and pt was standing over him with "blank stare". Pt was just seen at [facility] today d/t possible seizure activity. Pt was on valium and was weaning and as a result seizure activity. EMS reports when arriving to home there were several liquor bottles home disheveled. son and pt report he has not had a drink in 25  Years.
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
## 2014-12-12 NOTE — ED Notes (Signed)
Bed: WA21 Expected date:  Expected time:  Means of arrival:  Comments: ? Seizure released from [facility] at 11am has not filled meds
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
## 2014-12-12 NOTE — ED Notes (Signed)
Pt expresses that he is unable to urinate, attempted to stand and urinate however was unsuccessful.

## 2014-12-12 NOTE — Care Management Note (Addendum)
Case Management Note  Patient Details  Name: Ronald MarcusSamuel E Lookingbill Sr. MRN: [id number] Date of Birth: January 26, 1956  Subjective/Objective:        59 y.o. [name] seen in ED for the second time in as many days for "seizure" activity per family. Pt denies any insight into reasons for increase in sz activity. Reports accurate use of meds as prescribed. [facility] as family has refused to come and get pt from ED.           Action/Plan: pt [facility]HR[facility] HHPT, [facility] which will be continued at discharge.Referral Faxed to [phone])  Pt verbalized understanding.    Expected Discharge Date:                  Expected Discharge Plan:     In-House Referral:     Discharge planning Services     Post Acute Care Choice:    Choice offered to:     DME Arranged:    DME Agency:     HH Arranged:    HH Agency:     Status of Service:     Medicare Important Message Given:    Date Medicare IM Given:    Medicare IM give by:    Date Additional Medicare IM Given:    Additional Medicare Important Message give by:     If discussed at Long Length of Stay Meetings, dates discussed:    Additional Comments:  Ronald [name], Ronald Waight M, RN 12/12/2014, 9:19 AM

## 2014-12-12 NOTE — Discharge Instructions (Signed)
Please take Depakote 750 mg twice a day. Your discharge instructions the hospital included 500 mg tablet Depakote which he were to take twice a day. You are being prescribed a 250 mg tablet. Take one of each in the morning and one of each in the evening. Start your first dose this evening, since you received a dose through your IV in the hospital.   Epilepsy Epilepsy is a disorder in which a person has repeated seizures over time. A seizure is a release of abnormal electrical activity in the brain. Seizures can cause a change in attention, behavior, or the ability to remain awake and alert (altered mental status). Seizures often involve uncontrollable shaking (convulsions).  Most people with epilepsy lead normal lives. However, people with epilepsy are at an increased risk of falls, accidents, and injuries. Therefore, it is important to begin treatment right away. CAUSES  Epilepsy has many possible causes. Anything that disturbs the normal pattern of brain cell activity can lead to seizures. This may include:   Head injury.  Birth trauma.  High fever as a child.  Stroke.  Bleeding into or around the brain.  Certain drugs.  Prolonged low oxygen, such as what occurs after CPR efforts.  Abnormal brain development.  Certain illnesses, such as meningitis, encephalitis (brain infection), malaria, and other infections.  An imbalance of nerve signaling chemicals (neurotransmitters).  SIGNS AND SYMPTOMS  The symptoms of a seizure can vary greatly from one person to another. Right before a seizure, you may have a warning (aura) that a seizure is about to occur. An aura may include the following symptoms:  Fear or anxiety.  Nausea.  Feeling like the room is spinning (vertigo).  Vision changes, such as seeing flashing lights or spots. Common symptoms during a seizure include:  Abnormal sensations, such as an abnormal smell or a bitter taste in the mouth.   Sudden, general body  stiffness.   Convulsions that involve rhythmic jerking of the face, arm, or leg on one or both sides.   Sudden change in consciousness.   Appearing to be awake but not responding.   Appearing to be asleep but cannot be awakened.   Grimacing, chewing, lip smacking, drooling, tongue biting, or loss of bowel or bladder control. After a seizure, you may feel sleepy for a while. DIAGNOSIS  Your health care provider will ask about your symptoms and take a medical history. Descriptions from any witnesses to your seizures will be very helpful in the diagnosis. A physical exam, including a detailed neurological exam, is necessary. Various tests may be done, such as:   An electroencephalogram (EEG). This is a painless test of your brain waves. In this test, a diagram is created of your brain waves. These diagrams can be interpreted by a specialist.  An MRI of the brain.   A CT scan of the brain.   A spinal tap (lumbar puncture, LP).  Blood tests to check for signs of infection or abnormal blood chemistry. TREATMENT  There is no cure for epilepsy, but it is generally treatable. Once epilepsy is diagnosed, it is important to begin treatment as soon as possible. For most people with epilepsy, seizures can be controlled with medicines. The following may also be used:  A pacemaker for the brain (vagus nerve stimulator) can be used for people with seizures that are not well controlled by medicine.  Surgery on the brain. For some people, epilepsy eventually goes away. HOME CARE INSTRUCTIONS   Follow your health care  provider's recommendations on driving and safety in normal activities.  Get enough rest. Lack of sleep can cause seizures.  Only take over-the-counter or prescription medicines as directed by your health care provider. Take any prescribed medicine exactly as directed.  Avoid any known triggers of your seizures.  Keep a seizure diary. Record what you recall about any  seizure, especially any possible trigger.   Make sure the people you live and work with know that you are prone to seizures. They should receive instructions on how to help you. In general, a witness to a seizure should:   Cushion your head and body.   Turn you on your side.   Avoid unnecessarily restraining you.   Not place anything inside your mouth.   Call for emergency medical help if there is any question about what has occurred.   Follow up with your health care provider as directed. You may need regular blood tests to monitor the levels of your medicine.  SEEK MEDICAL CARE IF:   You develop signs of infection or other illness. This might increase the risk of a seizure.   You seem to be having more frequent seizures.   Your seizure pattern is changing.  SEEK IMMEDIATE MEDICAL CARE IF:   You have a seizure that does not stop after a few moments.   You have a seizure that causes any difficulty in breathing.   You have a seizure that results in a very severe headache.   You have a seizure that leaves you with the inability to speak or use a part of your body.  Document Released: 05/29/2005 Document Revised: 03/19/2013 Document Reviewed: 01/08/2013 [facility] Patient Information 2015 [place], [place]. This information is not intended to replace advice given to you by your health care provider. Make sure you discuss any questions you have with your health care provider.  Valproic Acid, Divalproex Sodium delayed or extended-release tablets What is this medicine? DIVALPROEX SODIUM (dye VAL pro ex SO dee um) is used to prevent seizures caused by some forms of epilepsy. It is also used to treat bipolar mania and to prevent migraine headaches. This medicine may be used for other purposes; ask your health care provider or pharmacist if you have questions. COMMON BRAND NAME(S): Depakote, Depakote ER What should I tell my health care provider before I take this  medicine? They need to know if you have any of these conditions: -blood disease -brain damage or disease -kidney disease -liver disease -low blood proteins -mitochondrial disease -suicidal thoughts, plans, or attempt; a previous suicide attempt by you or a family member -urea cycle disorder (UCD) -an unusual or allergic reaction to divalproex sodium, other medicines, foods, dyes, or preservatives -pregnant or trying to get pregnant -breast-feeding How should I use this medicine? Take this medicine by mouth with a drink of water. Follow the directions on the prescription label. Do not crush or chew. If this medicine upsets your stomach, take it with food or milk. Take your medicine at regular intervals. Do not take it more often than directed. Talk to your pediatrician regarding the use of this medicine in children. Special care may be needed. Overdosage: If you think you have taken too much of this medicine contact a poison control center or emergency room at once. NOTE: This medicine is only for you. Do not share this medicine with others. What if I miss a dose? If you miss a dose, take it as soon as you can. If it is almost time  for your next dose, take only that dose. Do not take double or extra doses. What may interact with this medicine? -aspirin -barbiturates, like phenobarbital -diazepam -isoniazid -medicines for depression, anxiety, or psychotic disturbances -medicines that treat or prevent blood clots like warfarin -meropenem -other seizure medicines -rifampin -tolbutamide -zidovudine This list may not describe all possible interactions. Give your health care provider a list of all the medicines, herbs, non-prescription drugs, or dietary supplements you use. Also tell them if you smoke, drink alcohol, or use illegal drugs. Some items may interact with your medicine. What should I watch for while using this medicine? Visit your doctor or health care professional for regular  checks on your progress. If you are taking this medicine to treat epilepsy (seizures), do not stop taking it suddenly. This increases the risk of seizures. Wear a medical identification bracelet or chain to say you have epilepsy or seizures, and carry a card that lists all your medicines. You may get drowsy, dizzy, or have blurred vision. Do not drive, use machinery, or do anything that needs mental alertness until you know how this medicine affects you. To reduce dizzy or fainting spells, do not sit or stand up quickly, especially if you are an older patient. Alcohol can increase drowsiness and dizziness. Avoid alcoholic drinks. This medicine can cause blood problems. This can mean slow healing and a risk of infection. Problems can arise if you need dental work, and in the day to day care of your teeth. Try to avoid damage to your teeth and gums when you brush or floss your teeth. This medicine can make you more sensitive to the sun. Keep out of the sun. If you cannot avoid being in the sun, wear protective clothing and use sunscreen. Do not use sun lamps or tanning beds/booths. The use of this medicine may increase the chance of suicidal thoughts or actions. Pay special attention to how you are responding while on this medicine. Any worsening of mood, or thoughts of suicide or dying should be reported to your health care professional right away. Women who become pregnant while using this medicine may enroll in the [place] Antiepileptic Drug Pregnancy Registry by calling 7471227208. This registry collects information about the safety of antiepileptic drug use during pregnancy. Contact your doctor or healthcare professional if you notice any part of your medicine in your stool. Your healthcare provider may want to check the amount of medicine in your blood if this happens. What side effects may I notice from receiving this medicine? Side effects that you should report to your doctor or health care  professional as soon as possible: -allergic reactions like skin rash, itching or hives, swelling of the face, lips, or tongue -changes in the frequency or severity of seizures -double vision or uncontrollable eye movements -nausea and vomiting -redness, blistering, peeling or loosening of the skin, including inside the mouth -stomach pain or cramps -trembling of hands or arms -unusual bleeding or bruising or pinpoint red spots on the skin -unusual swelling of the arms or legs -unusually weak or tired -worsening of mood, thoughts or actions of suicide or dying -yellowing of skin or eyes Side effects that usually do not require medical attention (report to your doctor or health care professional if they continue or are bothersome): -change in menstrual cycle -diarrhea or constipation -headache -loss of bladder control -loss of hair or unusual growth of hair -loss or increase in appetite -weight gain or loss This list may not describe all possible  side effects. Call your doctor for medical advice about side effects. You may report side effects to FDA at 1-800-FDA-1088. Where should I keep my medicine? Keep out of reach of children. Store at room temperature between 15 and 30 degrees C (59 and 86 degrees F). Keep container tightly closed. Throw away any unused medicine after the expiration date. NOTE: This sheet is a summary. It may not cover all possible information. If you have questions about this medicine, talk to your doctor, pharmacist, or health care provider.  2015, Elsevier/Gold Standard. (2012-01-23 11:50:42)

## 2014-12-12 NOTE — Discharge Instructions (Signed)
°  Home health assistant will be coming to your house tomorrow to continue working with you in treating your medical conditions.   Nonepileptic Seizures Nonepileptic seizures are seizures that are not caused by abnormal electrical signals in your brain. These seizures often seem like epileptic seizures, but they are not caused by epilepsy.  There are two types of nonepileptic seizures:  A physiologic nonepileptic seizure results from a disruption in your brain.  A psychogenic seizure results from emotional stress. These seizures are sometimes called pseudoseizures. CAUSES  Causes of physiologic nonepileptic seizures include:   Sudden drop in blood pressure.  Low blood sugar.  Low levels of salt (sodium) in your blood.  Low levels of calcium in your blood.  Migraine.  Heart rhythm problems.  Sleep disorders.  Drug and alcohol abuse. Common causes of psychogenic nonepileptic seizures include:  Stress.  Emotional trauma.  Sexual or physical abuse.  Major life events, such as divorce or the death of a loved one.  Mental health disorders, including panic attack and hyperactivity disorder. SIGNS AND SYMPTOMS A nonepileptic seizure can look like an epileptic seizure, including uncontrollable shaking (convulsions), or changes in attention, behavior, or the ability to remain awake and alert. However, there are some differences. Nonepileptic seizures usually:  Do not cause physical injuries.  Start slowly.  Include crying or shrieking.  Last longer than 2 minutes.  Have a short recovery time without headache or exhaustion. DIAGNOSIS  Your health care provider can usually diagnose nonepileptic seizures after taking your medical history and giving you a physical exam. Your health care provider may want to talk to your friends or relatives who have seen you have a seizure.  You may also need to have tests to look for causes of physiologic nonepileptic seizures. This may include  an electroencephalogram (EEG), which is a test that measures electrical activity in your brain. If you have had an epileptic seizure, the results of your EEG will be abnormal. If your health care provider thinks you have had a psychogenic nonepileptic seizure, you may need to see a mental health specialist for an evaluation. TREATMENT  Treatment depends on the type and cause of your seizures.  For physiologic nonepileptic seizures, treatment is aimed at addressing the underlying condition that caused the seizures. These seizures usually stop when the underlying condition is properly treated.  Nonepileptic seizures do not respond to the seizure medicines used to treat epilepsy.  For psychogenic seizures, you may need to work with a mental health specialist. HOME CARE INSTRUCTIONS Home care will depend on the type of nonepileptic seizures you have.   Follow all your health care provider's instructions.  Keep all your follow-up appointments. SEEK MEDICAL CARE IF: You continue to have seizures after treatment. SEEK IMMEDIATE MEDICAL CARE IF:  You injure yourself during a seizure.  You have chest pain or trouble breathing. MAKE SURE YOU:  Understand these instructions.  Will watch your condition.  Will get help right away if you are not doing well or get worse. Document Released: 07/14/2005 Document Revised: 10/13/2013 Document Reviewed: 03/25/2013 [facility] Patient Information 2015 [place], [place]LL[place]. This information is not intended to replace advice given to you by your health care provider. Make sure you discuss any questions you have with your health care provider.

## 2014-12-12 NOTE — ED Notes (Addendum)
Pt expressed that he is having difficulties going to his medical appointments. He has no transportation.  He also expressed a desire to understand his seizures which he does not understand.

## 2014-12-12 NOTE — ED Notes (Signed)
Trinna PostAlex, Social Worker [facility] and follow up on plan of care for pt.

## 2014-12-12 NOTE — ED Notes (Signed)
Notified [facility] for transportation back home 

## 2014-12-12 NOTE — ED Notes (Signed)
RN recontacted daughter to find out if she had found him a ride home.  She felt that he was not safe to return home due to his continued seizure activity.  Informed Dr. Preston FleetingGlick who spoke to the daughter via phone.
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
## 2014-12-12 NOTE — ED Notes (Signed)
Brought in by EMS.  Per EMS was originally called out due to patient being suicidal.  Once they arrived patient noted to be confused and had a gran mal seizure that lasted about a minute.  Continues to be confused.  Attempted to assess whether suicidal or not and the patient would only say "press back".  Alert at this time.

## 2014-12-12 NOTE — ED Notes (Signed)
Pts CBG is 111. Nurse notified.

## 2014-12-13 ENCOUNTER — Encounter (HOSPITAL_COMMUNITY): Payer: Self-pay | Admitting: General Practice

## 2014-12-13 DIAGNOSIS — R4189 Other symptoms and signs involving cognitive functions and awareness: Secondary | ICD-10-CM | POA: Diagnosis not present

## 2014-12-13 DIAGNOSIS — F329 Major depressive disorder, single episode, unspecified: Secondary | ICD-10-CM | POA: Diagnosis present

## 2014-12-13 DIAGNOSIS — G9340 Encephalopathy, unspecified: Secondary | ICD-10-CM | POA: Diagnosis not present

## 2014-12-13 DIAGNOSIS — Z7982 Long term (current) use of aspirin: Secondary | ICD-10-CM | POA: Diagnosis not present

## 2014-12-13 DIAGNOSIS — E43 Unspecified severe protein-calorie malnutrition: Secondary | ICD-10-CM | POA: Diagnosis present

## 2014-12-13 DIAGNOSIS — F13239 Sedative, hypnotic or anxiolytic dependence with withdrawal, unspecified: Secondary | ICD-10-CM | POA: Diagnosis present

## 2014-12-13 DIAGNOSIS — F1010 Alcohol abuse, uncomplicated: Secondary | ICD-10-CM | POA: Diagnosis not present

## 2014-12-13 DIAGNOSIS — J440 Chronic obstructive pulmonary disease with acute lower respiratory infection: Secondary | ICD-10-CM | POA: Diagnosis present

## 2014-12-13 DIAGNOSIS — M4806 Spinal stenosis, lumbar region: Secondary | ICD-10-CM | POA: Diagnosis present

## 2014-12-13 DIAGNOSIS — J690 Pneumonitis due to inhalation of food and vomit: Secondary | ICD-10-CM | POA: Diagnosis present

## 2014-12-13 DIAGNOSIS — F17210 Nicotine dependence, cigarettes, uncomplicated: Secondary | ICD-10-CM | POA: Diagnosis present

## 2014-12-13 DIAGNOSIS — J449 Chronic obstructive pulmonary disease, unspecified: Secondary | ICD-10-CM | POA: Diagnosis not present

## 2014-12-13 DIAGNOSIS — Z981 Arthrodesis status: Secondary | ICD-10-CM | POA: Diagnosis not present

## 2014-12-13 DIAGNOSIS — G40409 Other generalized epilepsy and epileptic syndromes, not intractable, without status epilepticus: Secondary | ICD-10-CM | POA: Diagnosis present

## 2014-12-13 DIAGNOSIS — Z888 Allergy status to other drugs, medicaments and biological substances status: Secondary | ICD-10-CM | POA: Diagnosis not present

## 2014-12-13 DIAGNOSIS — R569 Unspecified convulsions: Secondary | ICD-10-CM | POA: Diagnosis present

## 2014-12-13 DIAGNOSIS — Z885 Allergy status to narcotic agent status: Secondary | ICD-10-CM | POA: Diagnosis not present

## 2014-12-13 DIAGNOSIS — G4089 Other seizures: Secondary | ICD-10-CM | POA: Diagnosis not present

## 2014-12-13 LAB — GLUCOSE, CAPILLARY
GLUCOSE-CAPILLARY: 88 mg/dL (ref 65–99)
Glucose-Capillary: 83 mg/dL (ref 65–99)
Glucose-Capillary: 123 mg/dL — ABNORMAL HIGH (ref 65–99)

## 2014-12-13 LAB — PHOSPHORUS: PHOSPHORUS: 3.2 mg/dL (ref 2.5–4.6)

## 2014-12-13 LAB — RAPID URINE DRUG SCREEN, HOSP PERFORMED
Amphetamines: NOT DETECTED
Barbiturates: NOT DETECTED
Benzodiazepines: POSITIVE — AB
Cocaine: NOT DETECTED
Opiates: NOT DETECTED
Tetrahydrocannabinol: NOT DETECTED

## 2014-12-13 LAB — MAGNESIUM: MAGNESIUM: 1.8 mg/dL (ref 1.7–2.4)

## 2014-12-13 MED ORDER — IPRATROPIUM-ALBUTEROL 0.5-2.5 (3) MG/3ML IN SOLN
3.0000 mL | RESPIRATORY_TRACT | Status: DC
  Administered 2014-12-13 (×4): 3 mL via RESPIRATORY_TRACT
  Filled 2014-12-13 (×4): qty 3

## 2014-12-13 MED ORDER — ENSURE COMPLETE PO LIQD: 237.0000 mL | Freq: Two times a day (BID) | ORAL | Status: DC

## 2014-12-13 MED ORDER — IPRATROPIUM-ALBUTEROL 0.5-2.5 (3) MG/3ML IN SOLN: 3.0000 mL | RESPIRATORY_TRACT | Status: DC | PRN

## 2014-12-13 MED ORDER — DIVALPROEX SODIUM 500 MG PO DR TAB
500.0000 mg | DELAYED_RELEASE_TABLET | Freq: Two times a day (BID) | ORAL | Status: DC
Start: 2014-12-13 — End: 2014-12-13

## 2014-12-13 MED ORDER — VITAMIN B-1 100 MG PO TABS
100.0000 mg | ORAL_TABLET | Freq: Every day | ORAL | Status: DC
  Administered 2014-12-13 – 2014-12-16 (×4): 100 mg via ORAL
  Filled 2014-12-13 (×4): qty 1

## 2014-12-13 MED ORDER — DIVALPROEX SODIUM ER 500 MG PO TB24
750.0000 mg | ORAL_TABLET | Freq: Two times a day (BID) | ORAL | Status: DC
  Administered 2014-12-13 – 2014-12-16 (×7): 750 mg via ORAL
  Filled 2014-12-13 (×9): qty 1

## 2014-12-13 MED ORDER — LEVETIRACETAM 750 MG PO TABS
1500.0000 mg | ORAL_TABLET | Freq: Two times a day (BID) | ORAL | Status: DC
  Administered 2014-12-13 – 2014-12-14 (×2): 1500 mg via ORAL
  Filled 2014-12-13 (×2): qty 2

## 2014-12-13 MED ORDER — ALUM & MAG HYDROXIDE-SIMETH 200-200-20 MG/5ML PO SUSP: 30.0000 mL | Freq: Four times a day (QID) | ORAL | Status: DC | PRN

## 2014-12-13 MED ORDER — SODIUM CHLORIDE 0.9 % IV SOLN
1500.0000 mg | Freq: Once | INTRAVENOUS | Status: AC
  Administered 2014-12-13: 1500 mg via INTRAVENOUS
  Filled 2014-12-13: qty 15

## 2014-12-13 MED ORDER — DIVALPROEX SODIUM 250 MG PO DR TAB: 250.0000 mg | DELAYED_RELEASE_TABLET | Freq: Two times a day (BID) | ORAL | Status: DC

## 2014-12-13 MED ORDER — ONDANSETRON HCL 4 MG PO TABS: 4.0000 mg | ORAL_TABLET | Freq: Four times a day (QID) | ORAL | Status: DC | PRN

## 2014-12-13 MED ORDER — DIVALPROEX SODIUM ER 500 MG PO TB24
750.0000 mg | ORAL_TABLET | Freq: Two times a day (BID) | ORAL | Status: DC
  Filled 2014-12-13: qty 1

## 2014-12-13 MED ORDER — ACETAMINOPHEN 500 MG PO TABS: 500.0000 mg | ORAL_TABLET | Freq: Four times a day (QID) | ORAL | Status: DC | PRN

## 2014-12-13 MED ORDER — SODIUM CHLORIDE 0.9 % IJ SOLN
3.0000 mL | Freq: Two times a day (BID) | INTRAMUSCULAR | Status: DC
  Administered 2014-12-13 – 2014-12-16 (×3): 3 mL via INTRAVENOUS

## 2014-12-13 MED ORDER — ASPIRIN EC 81 MG PO TBEC
81.0000 mg | DELAYED_RELEASE_TABLET | Freq: Every day | ORAL | Status: DC
  Administered 2014-12-13 – 2014-12-16 (×4): 81 mg via ORAL
  Filled 2014-12-13 (×4): qty 1

## 2014-12-13 MED ORDER — ENSURE ENLIVE PO LIQD
237.0000 mL | Freq: Two times a day (BID) | ORAL | Status: DC
  Administered 2014-12-13 – 2014-12-14 (×4): 237 mL via ORAL

## 2014-12-13 MED ORDER — CITALOPRAM HYDROBROMIDE 40 MG PO TABS
40.0000 mg | ORAL_TABLET | Freq: Every day | ORAL | Status: DC
  Administered 2014-12-13 – 2014-12-15 (×3): 40 mg via ORAL
  Filled 2014-12-13 (×3): qty 1

## 2014-12-13 MED ORDER — DIVALPROEX SODIUM 500 MG PO DR TAB: 500.0000 mg | DELAYED_RELEASE_TABLET | ORAL | Status: DC

## 2014-12-13 MED ORDER — LEVETIRACETAM 750 MG PO TABS: 1500.0000 mg | ORAL_TABLET | Freq: Two times a day (BID) | ORAL | Status: DC

## 2014-12-13 MED ORDER — DM-GUAIFENESIN ER 30-600 MG PO TB12
1.0000 | ORAL_TABLET | Freq: Two times a day (BID) | ORAL | Status: DC
  Administered 2014-12-13 – 2014-12-16 (×8): 1 via ORAL
  Filled 2014-12-13 (×8): qty 1

## 2014-12-13 MED ORDER — ONDANSETRON HCL 4 MG/2ML IJ SOLN: 4.0000 mg | Freq: Four times a day (QID) | INTRAMUSCULAR | Status: DC | PRN

## 2014-12-13 MED ORDER — SODIUM CHLORIDE 0.9 % IV SOLN
INTRAVENOUS | Status: DC
  Administered 2014-12-13 (×3): via INTRAVENOUS
  Administered 2014-12-15: 50 mL/h via INTRAVENOUS

## 2014-12-13 MED ORDER — OXYCODONE-ACETAMINOPHEN 5-325 MG PO TABS
1.0000 | ORAL_TABLET | Freq: Four times a day (QID) | ORAL | Status: DC | PRN
  Administered 2014-12-13 – 2014-12-16 (×7): 1 via ORAL
  Filled 2014-12-13 (×7): qty 1

## 2014-12-13 MED ORDER — VITAMIN B-12 1000 MCG PO TABS
1000.0000 ug | ORAL_TABLET | Freq: Every day | ORAL | Status: DC
  Administered 2014-12-13 – 2014-12-16 (×4): 1000 ug via ORAL
  Filled 2014-12-13 (×4): qty 1

## 2014-12-13 MED ORDER — NICOTINE 21 MG/24HR TD PT24
21.0000 mg | MEDICATED_PATCH | Freq: Every day | TRANSDERMAL | Status: DC
  Administered 2014-12-13 – 2014-12-16 (×4): 21 mg via TRANSDERMAL
  Filled 2014-12-13 (×4): qty 1

## 2014-12-13 MED ORDER — ENOXAPARIN SODIUM 40 MG/0.4ML ~~LOC~~ SOLN
40.0000 mg | SUBCUTANEOUS | Status: DC
  Administered 2014-12-13 – 2014-12-16 (×4): 40 mg via SUBCUTANEOUS
  Filled 2014-12-13 (×4): qty 0.4

## 2014-12-13 MED ORDER — LORAZEPAM 2 MG/ML IJ SOLN
1.0000 mg | Freq: Four times a day (QID) | INTRAMUSCULAR | Status: DC | PRN
  Administered 2014-12-13 – 2014-12-15 (×3): 1 mg via INTRAVENOUS
  Filled 2014-12-13 (×3): qty 1

## 2014-12-13 MED ORDER — IPRATROPIUM-ALBUTEROL 0.5-2.5 (3) MG/3ML IN SOLN
3.0000 mL | Freq: Three times a day (TID) | RESPIRATORY_TRACT | Status: DC
  Administered 2014-12-14 – 2014-12-15 (×4): 3 mL via RESPIRATORY_TRACT
  Filled 2014-12-13 (×4): qty 3

## 2014-12-13 NOTE — Evaluation (Signed)
Physical Therapy Evaluation Patient Details Name: Ronald HOAK Sr. MRN: [id number] DOB: August 23, 1955 Today's Date: 12/13/2014   History of Present Illness  59 year old Caucasian male with a past medical history of seizure disorder, COPD, depression, chronic back pain. Has been hospitalized many times in the last few weeks for recurrent seizures. He was discharged on July 2. He was started on Depakote along with his Keppra. Patient presented back to the ED with another seizure at home and there was also mention of the patient being suicidal. Patient was noted to be confused. He was hospitalized for further management.  Clinical Impression  Pt with hallucinations and fixed on "look at those freaks over there." Unable to obtain home set up and support or PLOF. Unclear of appropriate d/c venue at this time. Not sure if patient would be appropriate for behavior health. Pt is unsafe to d/c at this time as pt unable to care for self and currently requires assist of 2 for safe mobility.Acute PT to con't to follow to con't to assess pt mobility and reassess d/c recommendations.    Follow Up Recommendations SNF;Supervision/Assistance - 24 hour (or behavior health)    Equipment Recommendations  None recommended by PT (TBD)    Recommendations for Other Services       Precautions / Restrictions Precautions Precautions: Fall Precaution Comments: pt also with hallucination Restrictions Weight Bearing Restrictions: No      Mobility  Bed Mobility Overal bed mobility: Needs Assistance Bed Mobility: Supine to Sit;Sit to Supine     Supine to sit: Min assist;+2 for safety/equipment Sit to supine: Min assist;+2 for safety/equipment   General bed mobility comments: pt impulsive with no insight to deficits  Transfers Overall transfer level: Needs assistance Equipment used: 1 person hand held assist Transfers: Sit to/from Stand Sit to Stand: +2 safety/equipment;Mod assist         General  transfer comment: pt unsteady and con't to hallucinate inhibiting focus on task at hand  Ambulation/Gait Ambulation/Gait assistance: Mod assist;+2 safety/equipment;+2 physical assistance Ambulation Distance (Feet): 40 Feet Assistive device: 1 person hand held assist Gait Pattern/deviations: Scissoring;Ataxic Gait velocity: decreased   General Gait Details: pt staggering all over, unable to focus due to hallucinations of "freaks standing over there". pt with cross over gait pattern and impulsivity  Stairs            Wheelchair Mobility    Modified Rankin (Stroke Patients Only)       Balance Overall balance assessment: Needs assistance           Standing balance-Leahy Scale: Poor Standing balance comment: requires assist due to impulsivity                             Pertinent Vitals/Pain Pain Assessment:  (didn't report)    Home Living Family/patient expects to be discharged to:: Private residence                 Additional Comments: pt poor historian and was unable to provide PLOF or home set up.    Prior Function           Comments: unclear of PLOF due to pt hallucinations and inability to report accurate info. Pt perseveration on "look at that freak"     Hand Dominance        Extremity/Trunk Assessment   Upper Extremity Assessment: Overall WFL for tasks assessed  Lower Extremity Assessment: Overall WFL for tasks assessed (however ataxic like sequencing during gait)      Cervical / Trunk Assessment: Normal  Communication   Communication: No difficulties  Cognition Arousal/Alertness: Awake/alert Behavior During Therapy: Restless;Agitated;Impulsive Overall Cognitive Status: Impaired/Different from baseline Area of Impairment: Orientation;Attention;Following commands;Safety/judgement;Awareness;Problem solving Orientation Level: Disoriented to;Time;Situation (unable to report president "Stacey DrainMichael Myers, he's a  freak") Current Attention Level: Focused Memory: Decreased recall of precautions;Decreased short-term memory Following Commands: Follows one step commands inconsistently Safety/Judgement: Decreased awareness of safety;Decreased awareness of deficits Awareness: Intellectual Problem Solving: Requires verbal cues;Requires tactile cues General Comments: pt hallucinating and perseveration on "freak" and transgendered bars    General Comments      Exercises        Assessment/Plan    PT Assessment Patient needs continued PT services  PT Diagnosis Difficulty walking;Generalized weakness   PT Problem List Decreased strength;Decreased activity tolerance;Decreased balance;Decreased mobility;Decreased coordination  PT Treatment Interventions DME instruction;Gait training;Stair training;Functional mobility training;Therapeutic activities;Therapeutic exercise;Balance training;Neuromuscular re-education   PT Goals (Current goals can be found in the Care Plan section) Acute Rehab PT Goals Patient Stated Goal: didn't state PT Goal Formulation: Patient unable to participate in goal setting Time For Goal Achievement: 12/20/14 Potential to Achieve Goals: Good    Frequency Min 3X/week   Barriers to discharge  (unclear of home set up and support)      Co-evaluation               End of Session Equipment Utilized During Treatment: Gait belt Activity Tolerance: Treatment limited secondary to agitation Patient left: in bed;with call bell/phone within reach;with bed alarm set Nurse Communication: Mobility status (hallucinations)         Time: [id number] PT Time Calculation (min) (ACUTE ONLY): 19 min   Charges:   PT Evaluation $Initial PT Evaluation Tier I: 1 Procedure     PT G CodesMarcene Brawn:        Jasman Pfeifle Marie 12/13/2014, 4:09 PM  Lewis ShockAshly Teofila Bowery, PT, DPT Pager #: [phone] Office #: [phone]

## 2014-12-13 NOTE — Progress Notes (Signed)
Patient arrived to 4N02 alert & oriented with confusion noted when asked questions gives inappropriate responses. Able to give name and DOB gave wrong answer for location.

## 2014-12-13 NOTE — ED Notes (Signed)
Pt able to tell me his, age, year and location.
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
## 2014-12-13 NOTE — Progress Notes (Signed)
Pt very agitated at this time. Hallucinating and has made several attempts to get up out of his bed without assist. Pt unaware of safety concerns. At this time a safety sitter is in pt's room attempting to calm him down. He denies pain or discomfort. He is not in distress. Md paged, new order for Ativan to be administered.
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
## 2014-12-13 NOTE — Progress Notes (Signed)
Utilization Review Completed.Ronald [name] T7/08/2014  
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
## 2014-12-14 DIAGNOSIS — G9340 Encephalopathy, unspecified: Secondary | ICD-10-CM

## 2014-12-14 DIAGNOSIS — E43 Unspecified severe protein-calorie malnutrition: Secondary | ICD-10-CM | POA: Insufficient documentation

## 2014-12-14 LAB — COMPREHENSIVE METABOLIC PANEL
ALBUMIN: 3 g/dL — AB (ref 3.5–5.0)
ALT: 13 U/L — AB (ref 17–63)
AST: 16 U/L (ref 15–41)
Alkaline Phosphatase: 45 U/L (ref 38–126)
Anion gap: 8 (ref 5–15)
BILIRUBIN TOTAL: 0.5 mg/dL (ref 0.3–1.2)
BUN: 7 mg/dL (ref 6–20)
CO2: 28 mmol/L (ref 22–32)
CREATININE: 0.56 mg/dL — AB (ref 0.61–1.24)
Calcium: 8.7 mg/dL — ABNORMAL LOW (ref 8.9–10.3)
Chloride: 100 mmol/L — ABNORMAL LOW (ref 101–111)
GFR calc non Af Amer: >60 mL/min (ref >60)
Glucose, Bld: 89 mg/dL (ref 65–99)
Potassium: 3.7 mmol/L (ref 3.5–5.1)
SODIUM: 136 mmol/L (ref 135–145)
Total Protein: 5.7 g/dL — ABNORMAL LOW (ref 6.5–8.1)

## 2014-12-14 LAB — CBC
HEMATOCRIT: 34.4 % — AB (ref 39.0–52.0)
HEMOGLOBIN: 11.4 g/dL — AB (ref 13.0–17.0)
MCH: 31.8 pg (ref 26.0–34.0)
MCHC: 33.1 g/dL (ref 30.0–36.0)
MCV: 95.8 fL (ref 78.0–100.0)
PLATELETS: 183 10*3/uL (ref 150–400)
RBC: 3.59 MIL/uL — ABNORMAL LOW (ref 4.22–5.81)
RDW: 13.4 % (ref 11.5–15.5)
WBC: 7.8 10*3/uL (ref 4.0–10.5)

## 2014-12-14 LAB — GLUCOSE, CAPILLARY: Glucose-Capillary: 75 mg/dL (ref 65–99)

## 2014-12-14 MED ORDER — LEVETIRACETAM 100 MG/ML PO SOLN
1500.0000 mg | Freq: Two times a day (BID) | ORAL | Status: DC
Start: 2014-12-14 — End: 2014-12-16
  Administered 2014-12-14 – 2014-12-16 (×5): 1500 mg via ORAL
  Filled 2014-12-14 (×8): qty 15

## 2014-12-14 MED ORDER — ENSURE ENLIVE PO LIQD
237.0000 mL | Freq: Three times a day (TID) | ORAL | Status: DC
  Administered 2014-12-14 – 2014-12-15 (×4): 237 mL via ORAL

## 2014-12-14 MED ORDER — ADULT MULTIVITAMIN W/MINERALS CH
1.0000 | ORAL_TABLET | Freq: Every day | ORAL | Status: DC
  Administered 2014-12-14 – 2014-12-16 (×3): 1 via ORAL
  Filled 2014-12-14 (×3): qty 1

## 2014-12-14 NOTE — Progress Notes (Signed)
Patient does not have dentures at bedside. Patient placed on soft diet. Daughter, Dois DavenportSandra aware, and states she will get dentures from Sam (son) and give to patient.
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
## 2014-12-14 NOTE — Clinical Social Work Note (Signed)
Clinical Social Work Assessment  Patient Details  Name: Ronald HAIGLER Sr. MRN: [id number] Date of Birth: 03/20/1956  Date of referral:  12/14/14               Reason for consult:  Facility Placement                Permission sought to share information with:  Family Supports Permission granted to share information::  Yes, Verbal Permission Granted  Name::     Ronald [name],Ronald [name]  Relationship::  sister   Contact Information:   [phone]  Housing/Transportation Living arrangements for the past 2 months:  Apartment Source of Information:  Patient (sibling: Tour manager) Patient Interpreter Needed:  None Criminal Activity/Legal Involvement Pertinent to Current Situation/Hospitalization:  No - Comment as needed Significant Relationships:  Siblings Lives with:  Self Do you feel safe going back to the place where you live?  Yes Need for family participation in patient care:  Yes (Comment)  Care giving concerns:  Ronald [name] expressed concerns with the pt ability to take care of himself at home.    Social Worker assessment / plan:  CSW met the pt and the pt's sister Ronald [name] at bedside. CSW introduced self and purpose of the visit. CSW discussed SNF rehab. CSW explained the SNF process. CSW explained insurance and its relation to SNF placement. CSW provided the pt/Ronald [name] with a SNF list. CSW and pt discussed geographic location in which he would like to receive rehab. The pt reported that he prefers to go home. Pt acknowledged that he does not have 24 hour supervisor at home. Ronald [name] reported that she will talk to the pt to encourage him to go to rehab. CSW answered all questions in which the pt inquired about. CSW will continue to follow this pt and assist with discharge as needed.   Employment status:  Unemployed Forensic scientist:  Medicare PT Recommendations:  [place] / Referral to community resources:  [place]  Patient/Family's Response to care:   Ronald [name] reported being thankful for all that the medical staff has done for the pt.   Patient/Family's Understanding of and Emotional Response to Diagnosis, Current Treatment, and Prognosis: Ronald [name] acknowledged that pt's medical condition. Ronald [name] reported that she wants the pt to get well and stay well.   Emotional Assessment Appearance:  Appears stated age Attitude/Demeanor/Rapport:   (Calm ) Affect (typically observed):  Apprehensive Orientation:  Oriented to Self, Oriented to Place, Oriented to  Time, Oriented to Situation Alcohol / Substance use:  Not Applicable Psych involvement (Current and /or in the community):  No (Comment)  Discharge Needs  Concerns to be addressed:  Home Safety Concerns Readmission within the last 30 days:  Yes Current discharge risk:  Lives alone Barriers to Discharge:  No Barriers Identified   Sparkle Aube, LCSW 12/14/2014, 12:28 PM

## 2014-12-14 NOTE — Progress Notes (Signed)
Subjective:  No further seizures while in hospital.  Had a good night per tech.  Objective: Current vital signs: BP 101/69 mmHg  Pulse 59  Temp(Src) 97.9 F (36.6 C) (Oral)  Resp 16  Ht 5\' 6"  (1.676 m)  Wt 58.3 kg (128 lb 8.5 oz)  BMI 20.75 kg/m2  SpO2 98% Vital signs in last 24 hours: Temp:  [97.5 F (36.4 C)-98.7 F (37.1 C)] 97.9 F (36.6 C) (07/04 0610) Pulse Rate:  [59-94] 59 (07/04 0610) Resp:  [16-20] 16 (07/04 0610) BP: (99-131)/(61-79) 101/69 mmHg (07/04 0610) SpO2:  [96 %-100 %] 98 % (07/04 0610)  Intake/Output from previous day: 07/03 0701 - 07/04 0700 In: 240 [P.O.:240] Out: 850 [Urine:850] Intake/Output this shift:   Nutritional status: Diet regular Room service appropriate?: Yes; Fluid consistency:: Thin  Neurologic Exam: General: Mental Status: Alert, oriented to hospital initially thought it was June but easily redirected.   Speech fluent without evidence of aphasia.  Able to follow 3 step commands without difficulty. Cranial Nerves: II: ; Visual fields grossly normal, pupils equal, round, reactive to light and accommodation III,IV, VI: ptosis not present, extra-ocular motions intact bilaterally V,VII: smile symmetric, facial light touch sensation normal bilaterally VIII: hearing normal bilaterally IX,X: uvula rises symmetrically XI: bilateral shoulder shrug XII: midline tongue extension without atrophy or fasciculations  Motor: Right : Upper extremity   5/5    Left:     Upper extremity   5/5  Lower extremity   5/5     Lower extremity   5/5 Tone and bulk:normal tone throughout; no atrophy noted Sensory: Pinprick and light touch intact throughout, bilaterally Deep Tendon Reflexes:  Right: Upper Extremity   Left: Upper extremity   biceps (C-5 to C-6) 2/4   biceps (C-5 to C-6) 2/4 tricep (C7) 2/4    triceps (C7) 2/4 Brachioradialis (C6) 2/4  Brachioradialis (C6) 2/4  Lower Extremity Lower Extremity  quadriceps (L-2 to L-4) 2/4   quadriceps (L-2  to L-4) 2/4 Achilles (S1) 2/4   Achilles (S1) 2/4  Plantars: Right: downgoing   Left: downgoing    Lab Results: Basic Metabolic Panel:  Recent Labs Lab 12/08/14 0350  12/09/14 2013 12/10/14 0520 12/12/14 0230 12/12/14 2300 12/13/14 0721 12/14/14 0445  NA 135  < > 131* 135 139 136  --  136  K 4.2  < > 3.9 4.2 4.2 3.9  --  3.7  CL 102  < > 94* 97* 99* 98*  --  100*  CO2 28  < > 30 29 29 27   --  28  GLUCOSE 99  < > 115* 87 100* 109*  --  89  BUN 14  < > 14 7 22* 10  --  7  CREATININE 0.69  < > 0.60* 0.52* 0.56* 0.52*  --  0.56*  CALCIUM 9.0  < > 8.9 8.9 9.5 9.1  --  8.7*  MG 2.0  --   --   --   --   --  1.8  --   PHOS  --   --   --   --   --   --  3.2  --   < > = values in this interval not displayed.  Liver Function Tests:  Recent Labs Lab 12/07/14 1532 12/09/14 2013 12/12/14 2300 12/14/14 0445  AST 24 20 24 16   ALT 16* 15* 16* 13*  ALKPHOS 61 55 56 45  BILITOT 0.8 0.9 0.6 0.5  PROT 6.7 6.2* 6.0* 5.7*  ALBUMIN  4.1 3.7 3.3* 3.0*    Recent Labs Lab 12/09/14 2013  LIPASE 19*    Recent Labs Lab 12/10/14 0026 12/11/14 0433  AMMONIA 37* 16    CBC:  Recent Labs Lab 12/07/14 1532  12/09/14 2013 12/10/14 0520 12/12/14 0230 12/12/14 2300 12/14/14 0445  WBC 9.0  < > 12.1* 12.2* 8.1 11.0* 7.8  NEUTROABS 6.6  --   --   --  5.7  --   --   HGB 14.1  < > 12.5* 12.6* 12.3* 12.4* 11.4*  HCT 41.9  < > 37.3* 37.0* 36.8* 36.4* 34.4*  MCV 96.3  < > 95.4 96.9 96.1 97.1 95.8  PLT 183  < > 169 165 195 202 183  < > = values in this interval not displayed.  Cardiac Enzymes:  Recent Labs Lab 12/10/14 0027 12/10/14 0609 12/10/14 1202  TROPONINI <0.03 <0.03 <0.03    Lipid Panel: No results for input(s): CHOL, TRIG, HDL, CHOLHDL, VLDL, LDLCALC in the last 168 hours.  CBG:  Recent Labs Lab 12/12/14 2241 12/13/14 0631 12/13/14 1056 12/13/14 1630 12/14/14 0630  GLUCAP 101* 88 83 123* 75    Microbiology: Results for orders placed or performed during  the hospital encounter of 12/07/14  MRSA PCR Screening     Status: None   Collection Time: 12/07/14  7:39 PM  Result Value Ref Range Status   MRSA by PCR NEGATIVE NEGATIVE Final    Comment:        The GeneXpert MRSA Assay (FDA approved for NASAL specimens only), is one component of a comprehensive MRSA colonization surveillance program. It is not intended to diagnose MRSA infection nor to guide or monitor treatment for MRSA infections.     Coagulation Studies: No results for input(s): LABPROT, INR in the last 72 hours.  Imaging: No results found.  Medications:  Scheduled: . aspirin EC  81 mg Oral Daily  . citalopram  40 mg Oral QHS  . dextromethorphan-guaiFENesin  1 tablet Oral BID  . divalproex  750 mg Oral BID  . enoxaparin (LOVENOX) injection  40 mg Subcutaneous Q24H  . feeding supplement (ENSURE ENLIVE)  237 mL Oral BID BM  . ipratropium-albuterol  3 mL Nebulization TID  . levETIRAcetam  1,500 mg Oral BID  . nicotine  21 mg Transdermal Daily  . sodium chloride  3 mL Intravenous Q12H  . thiamine  100 mg Oral Daily  . vitamin B-12  1,000 mcg Oral Daily    Assessment/Plan: 59 YO male with recurrent seizure. No further seizures since increasing Keppra.  At this time would make no further changes.   Again --No driving, operating heavy machinery, perform activities at heights, swimming or participation in water activities until release by outpatient physician.  This has been discussed with patient.     Neurology will sign off.   Felicie Morn PA-C [facility] [phone]  I personally participated in this patient's evaluation and management, including formulating the above clinical impression and management recommendations.  Venetia Maxon M.D. [facility] [phone]  12/14/2014, 9:22 AM

## 2014-12-14 NOTE — Evaluation (Signed)
Occupational Therapy Evaluation Patient Details Name: Ronald MALINA Sr. MRN: [id number] DOB: 08-Nov-1955 Today's Date: 12/14/2014    History of Present Illness 59 year old Caucasian male with a past medical history of seizure disorder, COPD, depression, chronic back pain. Has been hospitalized many times in the last few weeks for recurrent seizures. He was discharged on July 2. He was started on Depakote along with his Keppra. Patient presented back to the ED with another seizure at home and there was also mention of the patient being suicidal. Patient was noted to be confused. He was hospitalized for further management.   Clinical Impression   Pt sitting in chair upon arrival. Pt oriented to self only; states "TV says today is the 4th of July" but unable to tell day of week, location or current situation. Pt completed grooming task seated at sink due to balance deficits; VC's required for sequencing and task redirection. Pt able to problem solve for use of appropriate items, but increased time required to process task. Pt unsteady and required VC's for cane use. Pt would benefit from skilled OT for balance and safety to increase independence with ADLs. Pt unsafe to d/c home as Pt is unable to care for self at this time and states son is available to help "sometimes". Pt agreeable to continue receiving therapy prior to returning home.     Follow Up Recommendations  SNF;Supervision/Assistance - 24 hour    Equipment Recommendations  None recommended by OT    Recommendations for Other Services       Precautions / Restrictions Precautions Precautions: Fall Restrictions Weight Bearing Restrictions: No      Mobility Bed Mobility               General bed mobility comments: Pt in chair upon arrival  Transfers Overall transfer level: Needs assistance Equipment used: Straight cane Transfers: Sit to/from Stand Sit to Stand: Min assist         General transfer comment: Pt  unsteady; VC's for use of cane    Balance Overall balance assessment: Needs assistance Sitting-balance support: Single extremity supported;Feet supported       Standing balance support: Bilateral upper extremity supported;During functional activity                                ADL Overall ADL's : Needs assistance/impaired Eating/Feeding: Independent;Sitting   Grooming: Wash/dry hands;Wash/dry face;Oral care;Applying deodorant;Minimal assistance;Cueing for sequencing;Sitting   Upper Body Bathing: Minimal assitance;Cueing for sequencing;Sitting   Lower Body Bathing: Minimal assistance;Cueing for sequencing;Sit to/from stand   Upper Body Dressing : Minimal assistance;Cueing for sequencing;Sitting   Lower Body Dressing: Minimal assistance;Sit to/from stand   Toilet Transfer: Min guard;Ambulation;BSC   Toileting- Architect and Hygiene: Supervision/safety;Sit to/from stand       Functional mobility during ADLs: Minimal assistance;Cane General ADL Comments: VC's for sequencing and task redirection     Vision Vision Assessment?: No apparent visual deficits   Perception     Praxis      Pertinent Vitals/Pain Pain Assessment: No/denies pain     Hand Dominance Right   Extremity/Trunk Assessment Upper Extremity Assessment Upper Extremity Assessment: Overall WFL for tasks assessed   Lower Extremity Assessment Lower Extremity Assessment: Defer to PT evaluation   Cervical / Trunk Assessment Cervical / Trunk Assessment: Normal   Communication Communication Communication: No difficulties   Cognition Arousal/Alertness: Awake/alert Behavior During Therapy: Flat affect Overall Cognitive Status: No family/caregiver present  to determine baseline cognitive functioning Area of Impairment: Orientation;Safety/judgement;Memory;Problem solving Orientation Level: Disoriented to;Time;Situation Current Attention Level: Focused Memory: Decreased short-term  memory Following Commands: Follows one step commands consistently Safety/Judgement: Decreased awareness of safety;Decreased awareness of deficits Awareness: Intellectual Problem Solving: Requires verbal cues;Slow processing General Comments: denies having hallucinations today   General Comments       Exercises       Shoulder Instructions      Home Living Family/patient expects to be discharged to:: Private residence Living Arrangements: Children Available Help at Discharge: Family;Available PRN/intermittently;Other (Comment) Type of Home: House Home Access: Stairs to enter     Home Layout: One level     Bathroom Shower/Tub: Tub/shower unit Shower/tub characteristics: Engineer, building servicesCurtain Bathroom Toilet: Standard     Home Equipment: Gilmer Morane - single point;Crutches;Grab bars - tub/shower          Prior Functioning/Environment Level of Independence: Independent             OT Diagnosis: Generalized weakness;Cognitive deficits   OT Problem List: Decreased strength;Decreased activity tolerance;Impaired balance (sitting and/or standing);Decreased coordination;Decreased cognition;Decreased safety awareness   OT Treatment/Interventions: Self-care/ADL training;Therapeutic exercise;DME and/or AE instruction;Therapeutic activities;Patient/family education;Balance training    OT Goals(Current goals can be found in the care plan section) Acute Rehab OT Goals Patient Stated Goal: none given OT Goal Formulation: With patient ADL Goals Pt Will Perform Grooming: with modified independence;sitting Pt Will Transfer to Toilet: with supervision;ambulating;bedside commode Pt Will Perform Tub/Shower Transfer: Tub transfer;with min guard assist;ambulating;grab bars Additional ADL Goal #1: Pt will complete 2 step ADL task with minimal VC's for sequencing.  OT Frequency: Min 2X/week   Barriers to D/C:            Co-evaluation              End of Session Equipment Utilized During  Treatment: Gait belt;Other (comment) (cane) Nurse Communication: Mobility status  Activity Tolerance: Patient tolerated treatment well;No increased pain Patient left: in chair;with call bell/phone within reach;with chair alarm set   Time: 1000-1030 OT Time Calculation (min): 30 min Charges:  OT General Charges $OT Visit: 1 Procedure OT Evaluation $Initial OT Evaluation Tier I: 1 Procedure OT Treatments $Self Care/Home Management : 8-22 mins G-Codes:    Marden NobleMary Rebecca [name] 12/14/2014, 10:56 AM I agree with the following treatment note after reviewing documentation.   Mateo FlowJones, Brynn OTR/L Pager: [phone] Office: [phone] .

## 2014-12-14 NOTE — Clinical Social Work Placement (Signed)
   CLINICAL SOCIAL WORK PLACEMENT  NOTE  Date:  12/14/2014  Patient Details  Name: Ronald MarcusSamuel E Kube Sr. MRN: [id number] Date of Birth: 06-07-1956  Clinical Social Work is seeking post-discharge placement for this patient at the Skilled  Nursing Facility level of care (*CSW will initial, date and re-position this form in  chart as items are completed):  Yes   Patient/family provided with [facility] Clinical Social Work Department's list of facilities offering this level of care within the geographic area requested by the patient (or if unable, by the patient's family).  Yes   Patient/family informed of their freedom to choose among providers that offer the needed level of care, that participate in Medicare, Medicaid or managed care program needed by the patient, have an available bed and are willing to accept the patient.  Yes   Patient/family informed of 's ownership interest in [facility] and [facility], as well as of the fact that they are under no obligation to receive care at these facilities.  PASRR submitted to EDS on 12/14/14     PASRR number received on       Existing PASRR number confirmed on       FL2 transmitted to all facilities in geographic area requested by pt/family on       FL2 transmitted to all facilities within larger geographic area on 12/14/14     Patient informed that his/her managed care company has contracts with or will negotiate with certain facilities, including the following:            Patient/family informed of bed offers received.  Patient chooses bed at       Physician recommends and patient chooses bed at      Patient to be transferred to   on  .  Patient to be transferred to facility by       Patient family notified on   of transfer.  Name of family member notified:        PHYSICIAN       Additional Comment:    _______________________________________________ Gwynne EdingerBibbs, Taurean Ju, LCSW 12/14/2014, 12:56 PM
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
## 2014-12-14 NOTE — Progress Notes (Signed)
Initial Nutrition Assessment  DOCUMENTATION CODES:  Severe malnutrition in context of chronic illness, Underweight  INTERVENTION:  Ensure Enlive (each supplement provides 350kcal and 20 grams of protein), MVI  NUTRITION DIAGNOSIS:  Malnutrition related to chronic illness as evidenced by severe depletion of muscle mass, percent weight loss, moderate depletion of body fat.   GOAL:  Patient will meet greater than or equal to 90% of their needs   MONITOR:  PO intake, Supplement acceptance, Weight trends, Labs  REASON FOR ASSESSMENT:  Malnutrition Screening Tool    ASSESSMENT: 59 year old Caucasian male with a past medical history of seizure disorder, COPD, depression, chronic back pain. Has been hospitalized many times in the last few weeks for recurrent seizures. He was discharged on July 2. Patient presented back to the ED with another seizure at home and there was also mention of the patient being suicidal.   Pt has severe muscle wasting and moderate fat wasting. Weight history shows that pt lost 12% of his body weight in 4-5 months from February to June. Question accuracy of current weight as it is unlikely pt regained 20 lbs in the past week. Pt ate 95% of breakfast this morning per nursing notes however lunch tray mostly untouched at time of visit.  Pt reports having a good appetite, he drinks Ensure Complete 3 times daily at home. RD encouraged PO intake.   Labs: low hemoglobin, low calcium  Height:  Ht Readings from Last 1 Encounters:  12/13/14 5\' 6"  (1.676 m)  5'11"  Weight:  Wt Readings from Last 1 Encounters:  12/13/14 128 lb 8.5 oz (58.3 kg)    Ideal Body Weight:  78 kg  Wt Readings from Last 10 Encounters:  12/13/14 128 lb 8.5 oz (58.3 kg)  12/10/14 113 lb 1.5 oz (51.3 kg)  12/07/14 108 lb 11 oz (49.3 kg)  12/01/14 115 lb (52.164 kg)  11/25/14 115 lb (52.164 kg)  07/29/14 123 lb 14.4 oz (56.201 kg)  01/08/14 130 lb 6.4 oz (59.149 kg)  12/28/13 132  lb (59.875 kg)  09/24/13 138 lb 14.4 oz (63.005 kg)    BMI:  Body mass index is 17.85 kg/(m^2). (Based on height of 5'11" = Underweight)  Estimated Nutritional Needs:  Kcal:  1800-2000  Protein:  85-95 grams  Fluid:  1.8 L/day  Skin:  Reviewed, no issues  Diet Order:  DIET DYS 3 Room service appropriate?: Yes; Fluid consistency:: Thin  EDUCATION NEEDS:  No education needs identified at this time   Intake/Output Summary (Last 24 hours) at 12/14/14 1307 Last data filed at 12/14/14 1249  Gross per 24 hour  Intake      0 ml  Output   1300 ml  Net  -1300 ml    Last BM:  PTA  Ian Malkineanne Barnett RD, LDN Inpatient Clinical Dietitian Pager: [phone] After Hours Pager: [phone]

## 2014-12-15 ENCOUNTER — Ambulatory Visit: Payer: Medicare Other | Admitting: Family Medicine

## 2014-12-15 ENCOUNTER — Inpatient Hospital Stay (HOSPITAL_COMMUNITY): Payer: Medicare Other

## 2014-12-15 DIAGNOSIS — J690 Pneumonitis due to inhalation of food and vomit: Secondary | ICD-10-CM | POA: Diagnosis present

## 2014-12-15 LAB — CBC
HEMATOCRIT: 36.9 % — AB (ref 39.0–52.0)
Hemoglobin: 12.5 g/dL — ABNORMAL LOW (ref 13.0–17.0)
MCH: 32.7 pg (ref 26.0–34.0)
MCHC: 33.9 g/dL (ref 30.0–36.0)
MCV: 96.6 fL (ref 78.0–100.0)
Platelets: 217 10*3/uL (ref 150–400)
RBC: 3.82 MIL/uL — ABNORMAL LOW (ref 4.22–5.81)
RDW: 13.3 % (ref 11.5–15.5)
WBC: 14.1 10*3/uL — AB (ref 4.0–10.5)

## 2014-12-15 LAB — BASIC METABOLIC PANEL
Anion gap: 12 (ref 5–15)
BUN: 9 mg/dL (ref 6–20)
CHLORIDE: 96 mmol/L — AB (ref 101–111)
CO2: 26 mmol/L (ref 22–32)
CREATININE: 0.57 mg/dL — AB (ref 0.61–1.24)
Calcium: 8.7 mg/dL — ABNORMAL LOW (ref 8.9–10.3)
GFR calc non Af Amer: >60 mL/min (ref >60)
GLUCOSE: 120 mg/dL — AB (ref 65–99)
Potassium: 3.7 mmol/L (ref 3.5–5.1)
Sodium: 134 mmol/L — ABNORMAL LOW (ref 135–145)

## 2014-12-15 LAB — TROPONIN I: Troponin I: <0.03 ng/mL (ref <0.031)

## 2014-12-15 LAB — GLUCOSE, CAPILLARY: Glucose-Capillary: 103 mg/dL — ABNORMAL HIGH (ref 65–99)

## 2014-12-15 LAB — MAGNESIUM: Magnesium: 1.5 mg/dL — ABNORMAL LOW (ref 1.7–2.4)

## 2014-12-15 MED ORDER — TIOTROPIUM BROMIDE MONOHYDRATE 18 MCG IN CAPS
18.0000 ug | ORAL_CAPSULE | Freq: Every day | RESPIRATORY_TRACT | Status: DC
  Administered 2014-12-15 – 2014-12-16 (×2): 18 ug via RESPIRATORY_TRACT
  Filled 2014-12-15: qty 5

## 2014-12-15 MED ORDER — LEVALBUTEROL HCL 0.63 MG/3ML IN NEBU
0.6300 mg | INHALATION_SOLUTION | Freq: Three times a day (TID) | RESPIRATORY_TRACT | Status: DC
Start: 2014-12-15 — End: 2014-12-16
  Administered 2014-12-15 (×2): 0.63 mg via RESPIRATORY_TRACT
  Filled 2014-12-15 (×2): qty 3

## 2014-12-15 MED ORDER — LEVALBUTEROL HCL 0.63 MG/3ML IN NEBU: 0.6300 mg | INHALATION_SOLUTION | RESPIRATORY_TRACT | Status: DC | PRN

## 2014-12-15 MED ORDER — MIRTAZAPINE 15 MG PO TABS
7.5000 mg | ORAL_TABLET | Freq: Every day | ORAL | Status: DC
  Administered 2014-12-15: 7.5 mg via ORAL
  Filled 2014-12-15: qty 1

## 2014-12-15 MED ORDER — SODIUM CHLORIDE 0.9 % IV SOLN
3.0000 g | Freq: Four times a day (QID) | INTRAVENOUS | Status: DC
  Administered 2014-12-15 – 2014-12-16 (×5): 3 g via INTRAVENOUS
  Filled 2014-12-15 (×8): qty 3

## 2014-12-15 MED ORDER — DILTIAZEM HCL 30 MG PO TABS
30.0000 mg | ORAL_TABLET | Freq: Three times a day (TID) | ORAL | Status: DC
  Administered 2014-12-15 – 2014-12-16 (×4): 30 mg via ORAL
  Filled 2014-12-15 (×4): qty 1
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
## 2014-12-15 NOTE — Clinical Social Work Note (Signed)
Clinical Social Worker has presented bed offers to patient. Patient chooses bed at [facility]. Per MD patient will likely discharge tomorrow or once medically stable.   CSW to contact facility to notify staff of patient's acceptance. CSW remains available as needed.   Ronald [name], MSW, LCSWA [phone] 12/15/2014 10:41 AM
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
## 2014-12-15 NOTE — Care Management (Signed)
Important Message  Patient Details  Name: Ronald MarcusSamuel E Staver Sr. MRN: [id number] Date of Birth: 1955/10/21   Medicare Important Message Given:  Yes-second notification given    Bernadette HoitShoffner, Terrionna Bridwell Coleman 12/15/2014, 10:53 AM

## 2014-12-15 NOTE — Progress Notes (Signed)
Occupational Therapy Treatment Patient Details Name: Ronald MarcusSamuel E [place] Sr. MRN: [id number] DOB: June 24, 1955 Today's Date: 12/15/2014    History of present illness 59 year old Caucasian male with a past medical history of seizure disorder, COPD, depression, chronic back pain. Has been hospitalized many times in the last few weeks for recurrent seizures. He was discharged on July 2. He was started on Depakote along with his Keppra. Patient presented back to the ED with another seizure at home and there was also mention of the patient being suicidal. Patient was noted to be confused. He was hospitalized for further management.   OT comments  Pt progressing towards acute OT goals. Focus of session was on balance and cogntion during ADL tasks. ADLs completed as detailed below. Pt requiring mod A 2x during functional mobility ambulating to bathroom due to LOB. OT to continue to follow acutely. D/c plan remains appropriate.  Follow Up Recommendations  SNF;Supervision/Assistance - 24 hour    Equipment Recommendations  None recommended by OT    Recommendations for Other Services      Precautions / Restrictions Precautions Precautions: Fall Precaution Comments: pt also with hallucination at times Restrictions Weight Bearing Restrictions: No       Mobility Bed Mobility               General bed mobility comments: in recliner  Transfers Overall transfer level: Needs assistance Equipment used: Straight cane Transfers: Sit to/from Stand Sit to Stand: Min assist;Min guard         General transfer comment: Assist for balance with pt standing quickly at times. Cued to slow down.Pt has cane but does not use functionally during ambulation without cueing.    Balance Overall balance assessment: Needs assistance Sitting-balance support: Single extremity supported;Feet supported Sitting balance-Leahy Scale: Fair     Standing balance support: Single extremity supported;During functional  activity Standing balance-Leahy Scale: Poor Standing balance comment: LOB 2x ambulating to bathroom requring mod A to stabilize. Pt with noted swaying in static standing position, min A in standing position at times.                   ADL Overall ADL's : Needs assistance/impaired     Grooming: Wash/dry hands;Oral care;Set up;Sitting;Minimal assistance;Standing Grooming Details (indicate cue type and reason): Pt encouraged to sit for grooming tasks but stood multiple times during session. Min A in standing position for balance. Cues for sequencing, object identification, attention, and problem solving.                              Functional mobility during ADLs: Minimal assistance;Moderate assistance;Cane General ADL Comments: For functional mobility mostly min A but mod A needed for LOB 2x ambulating to bathroom. Cues during ADLs for cognition as detailed above. Impulsive at times.       Vision                     Perception     Praxis      Cognition   Behavior During Therapy: Flat affect Overall Cognitive Status: No family/caregiver present to determine baseline cognitive functioning Area of Impairment: Orientation;Safety/judgement;Memory;Problem solving;Attention;Following commands Orientation Level: Time (correctly stated year, could not state month/day) Current Attention Level: Sustained Memory: Decreased recall of precautions;Decreased short-term memory  Following Commands: Follows one step commands with increased time (cues for sequencing; difficulty following 2-step commands) Safety/Judgement: Decreased awareness of safety;Decreased awareness of deficits   Problem Solving:  Slow processing;Decreased initiation;Difficulty sequencing;Requires verbal cues;Requires tactile cues General Comments: Pt needing cues for sequencing simple ADL task (brushing teeth). After brushing teeth pt used toothpaste as denture adhesive. Verbal and tectile cues not to  place dentures in mouth with a line of toothpaste on them. Pt also noted to be impulsive, quickly standing in an attempt to walk to recliner to retrieve ringing phone with little regard to lines. Cued verbal and tactilely to slow down, assist to problem solve (reminding pt that person can call back.)    Extremity/Trunk Assessment               Exercises     Shoulder Instructions       General Comments      Pertinent Vitals/ Pain       Pain Assessment: Faces Faces Pain Scale: Hurts even more Pain Location: back and neck Pain Descriptors / Indicators: Constant Pain Intervention(s): Limited activity within patient's tolerance;Monitored during session;Repositioned;RN gave pain meds during session  Home Living                                          Prior Functioning/Environment              Frequency Min 2X/week     Progress Toward Goals  OT Goals(current goals can now be found in the care plan section)  Progress towards OT goals: Progressing toward goals  Acute Rehab OT Goals Patient Stated Goal: none given OT Goal Formulation: With patient ADL Goals Pt Will Perform Grooming: with modified independence;sitting Pt Will Transfer to Toilet: with supervision;ambulating;bedside commode Pt Will Perform Tub/Shower Transfer: Tub transfer;with min guard assist;ambulating;grab bars Additional ADL Goal #1: Pt will complete 2 step ADL task with minimal VC's for sequencing.  Plan Discharge plan remains appropriate    Co-evaluation                 End of Session Equipment Utilized During Treatment: Gait belt;Other (comment) (cane)   Activity Tolerance Patient tolerated treatment well;No increased pain   Patient Left in chair;with call bell/phone within reach;with chair alarm set   Nurse Communication          Time: [phone] OT Time Calculation (min): 25 min  Charges: OT General Charges $OT Visit: 1 Procedure OT Treatments $Self  Care/Home Management : 23-37 mins  Pilar Grammes 12/15/2014, 2:20 PM

## 2014-12-15 NOTE — Progress Notes (Signed)
Pt remains with rhythm intermittent sinus tachycardic above 140's received numerous messages from telemetry. No noted distress during assessment.  Administered Ativan per Md prn order for pt c/o anxiety. Pt stated, " I get nervous at times and I have tremors." Md notified. Call bell within reach. Will continue to monitor.
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
## 2014-12-15 NOTE — Progress Notes (Signed)
Physical Therapy Treatment Patient Details Name: Ronald PINA Sr. MRN: [id number] DOB: [birth year] Today's Date: 12/15/2014    History of Present Illness 59 year old Caucasian male with a past medical history of seizure disorder, COPD, depression, chronic back pain. Has been hospitalized many times in the last few weeks for recurrent seizures. He was discharged on July 2. He was started on Depakote along with his Keppra. Patient presented back to the ED with another seizure at home and there was also mention of the patient being suicidal. Patient was noted to be confused. He was hospitalized for further management.    PT Comments    Patient progressing with mobility/balance, but limited by elevated HR up to 148 with ambulation.  Patient did not demonstrate hallucinations today, but remains with flat affect and sustained attention level.  Will follow acutely, continue to recommend SNF level rehab at d/c.  Follow Up Recommendations  SNF;Supervision/Assistance - 24 hour     Equipment Recommendations  None recommended by PT    Recommendations for Other Services       Precautions / Restrictions Precautions Precautions: Fall    Mobility  Bed Mobility         Supine to sit: Supervision;HOB elevated     General bed mobility comments: relies on UE assist for supine to sit   Transfers Overall transfer level: Needs assistance Equipment used: Straight cane   Sit to Stand: Min guard         General transfer comment: assist for balance/safety  Ambulation/Gait Ambulation/Gait assistance: Min assist;Mod assist Ambulation Distance (Feet): 70 Feet (x 2) Assistive device: Straight cane;1 person hand held assist Gait Pattern/deviations: Step-through pattern;Decreased stride length;Ataxic;Scissoring     General Gait Details: cues for increased BOS to prevent cross over, pt unable to sequence cane safely leaving it behind him at times, improved some with cues and ambulating at safe  speed today, limited due to elevated HR up to high 140's with ambulation    Stairs            Wheelchair Mobility    Modified Rankin (Stroke Patients Only)       Balance Overall balance assessment: Needs assistance           Standing balance-Leahy Scale: Poor Standing balance comment: leaning posterior in static standing at bedside while PT donning gait belt                    Cognition Arousal/Alertness: Awake/alert Behavior During Therapy: Flat affect Overall Cognitive Status: No family/caregiver present to determine baseline cognitive functioning     Current Attention Level: Sustained   Following Commands: Follows one step commands with increased time;Follows one step commands consistently     Problem Solving: Slow processing;Decreased initiation;Requires verbal cues      Exercises General Exercises - Lower Extremity Long Arc Quad: AROM;Both;10 reps;Seated;Strengthening Hip Flexion/Marching: Strengthening;10 reps;Seated;Both Toe Raises: AROM;Both;10 reps;Seated    General Comments        Pertinent Vitals/Pain Pain Assessment: Faces Faces Pain Scale: Hurts even more Pain Location: back and neck Pain Descriptors / Indicators: Constant Pain Intervention(s): Monitored during session;Repositioned    Home Living                      Prior Function            PT Goals (current goals can now be found in the care plan section) Progress towards PT goals: Progressing toward goals    Frequency  Min 3X/week    PT Plan Current plan remains appropriate    Co-evaluation             End of Session Equipment Utilized During Treatment: Gait belt Activity Tolerance: Treatment limited secondary to medical complications (Comment) Patient left: in chair;with chair alarm set;with nursing/sitter in room;with call bell/phone within reach     Time: 0902-0928 PT Time Calculation (min) (ACUTE ONLY): 26 min  Charges:  $Gait Training:  8-22 mins $Therapeutic Exercise: 8-22 mins                    G Codes:      Pamelia Botto,CYNDI 12/15/2014, 9:40 AM  Sheran Lawlessyndi Ahnesti Townsend, PT [phone] 12/15/2014

## 2014-12-16 DIAGNOSIS — Z720 Tobacco use: Secondary | ICD-10-CM

## 2014-12-16 DIAGNOSIS — E43 Unspecified severe protein-calorie malnutrition: Secondary | ICD-10-CM

## 2014-12-16 DIAGNOSIS — E46 Unspecified protein-calorie malnutrition: Secondary | ICD-10-CM

## 2014-12-16 DIAGNOSIS — R569 Unspecified convulsions: Secondary | ICD-10-CM

## 2014-12-16 DIAGNOSIS — R4189 Other symptoms and signs involving cognitive functions and awareness: Secondary | ICD-10-CM

## 2014-12-16 DIAGNOSIS — J690 Pneumonitis due to inhalation of food and vomit: Secondary | ICD-10-CM

## 2014-12-16 DIAGNOSIS — J449 Chronic obstructive pulmonary disease, unspecified: Secondary | ICD-10-CM

## 2014-12-16 DIAGNOSIS — G4089 Other seizures: Secondary | ICD-10-CM

## 2014-12-16 DIAGNOSIS — F1010 Alcohol abuse, uncomplicated: Secondary | ICD-10-CM

## 2014-12-16 DIAGNOSIS — F329 Major depressive disorder, single episode, unspecified: Secondary | ICD-10-CM

## 2014-12-16 LAB — URINE DRUGS OF ABUSE SCREEN W ALC, ROUTINE (REF LAB)
AMPHETAMINES, URINE: NEGATIVE ng/mL
Barbiturate, Ur: NEGATIVE ng/mL
CANNABINOID QUANT UR: NEGATIVE ng/mL
COCAINE (METAB.): NEGATIVE ng/mL
Ethanol U, Quan: NEGATIVE %
METHADONE SCREEN, URINE: NEGATIVE ng/mL
PROPOXYPHENE, URINE: NEGATIVE ng/mL
Phencyclidine, Ur: NEGATIVE ng/mL

## 2014-12-16 LAB — GLUCOSE, CAPILLARY
GLUCOSE-CAPILLARY: 67 mg/dL (ref 65–99)
Glucose-Capillary: 83 mg/dL (ref 65–99)
Glucose-Capillary: 68 mg/dL (ref 65–99)

## 2014-12-16 LAB — CBC
HEMATOCRIT: 35.6 % — AB (ref 39.0–52.0)
HEMOGLOBIN: 12.1 g/dL — AB (ref 13.0–17.0)
MCH: 33.5 pg (ref 26.0–34.0)
MCHC: 34 g/dL (ref 30.0–36.0)
MCV: 98.6 fL (ref 78.0–100.0)
Platelets: 194 10*3/uL (ref 150–400)
RBC: 3.61 MIL/uL — AB (ref 4.22–5.81)
RDW: 13.7 % (ref 11.5–15.5)
WBC: 12.4 10*3/uL — AB (ref 4.0–10.5)

## 2014-12-16 LAB — DRUG PROFILE 799031
BENZODIAZEPINES: POSITIVE — AB
HYDROXYALPRAZOLAM: NEGATIVE
NORDIAZEPAM: NEGATIVE
OXAZEPAM GC/MS CONF: 876 ng/mL
Oxazepam: POSITIVE — AB

## 2014-12-16 LAB — BASIC METABOLIC PANEL
Anion gap: 10 (ref 5–15)
BUN: 9 mg/dL (ref 6–20)
CALCIUM: 9.1 mg/dL (ref 8.9–10.3)
CO2: 29 mmol/L (ref 22–32)
CREATININE: 0.49 mg/dL — AB (ref 0.61–1.24)
Chloride: 98 mmol/L — ABNORMAL LOW (ref 101–111)
GFR calc Af Amer: >60 mL/min (ref >60)
GFR calc non Af Amer: >60 mL/min (ref >60)
GLUCOSE: 72 mg/dL (ref 65–99)
Potassium: 3.8 mmol/L (ref 3.5–5.1)
SODIUM: 137 mmol/L (ref 135–145)

## 2014-12-16 MED ORDER — LEVETIRACETAM 750 MG PO TABS: 1500.0000 mg | ORAL_TABLET | Freq: Two times a day (BID) | ORAL | Status: DC

## 2014-12-16 MED ORDER — AMOXICILLIN-POT CLAVULANATE 875-125 MG PO TABS: 1.0000 | ORAL_TABLET | Freq: Two times a day (BID) | ORAL | Status: DC

## 2014-12-16 MED ORDER — MIRTAZAPINE 7.5 MG PO TABS: 7.5000 mg | ORAL_TABLET | Freq: Every day | ORAL | Status: DC

## 2014-12-16 MED ORDER — LEVALBUTEROL HCL 0.63 MG/3ML IN NEBU
0.6300 mg | INHALATION_SOLUTION | Freq: Three times a day (TID) | RESPIRATORY_TRACT | Status: DC
  Administered 2014-12-16: 0.63 mg via RESPIRATORY_TRACT
  Filled 2014-12-16: qty 3

## 2014-12-16 MED ORDER — DIVALPROEX SODIUM ER 250 MG PO TB24: 750.0000 mg | ORAL_TABLET | Freq: Two times a day (BID) | ORAL | Status: DC

## 2014-12-16 NOTE — Progress Notes (Signed)
Physical Therapy Treatment Patient Details Name: Ronald MarcusSamuel E Atha Sr. MRN: [id number] DOB: 1956-06-12 Today's Date: 12/16/2014    History of Present Illness 59 year old Caucasian male with a past medical history of seizure disorder, COPD, depression, chronic back pain. Has been hospitalized many times in the last few weeks for recurrent seizures. He was discharged on July 2. He was started on Depakote along with his Keppra. Patient presented back to the ED with another seizure at home and there was also mention of the patient being suicidal. Patient was noted to be confused. He was hospitalized for further management.    PT Comments    Assisted pt OOB to amb to BR to void then wash hands at sink.  Very unsteady gait.  HIGH FALL RISK.  Assisted with amb in hallway with his cane.  LOB x 2 and misuse of cane 75%.  Groggy cognition.  Assisted to recliner for lunch.  Pt will need ST Rehab.   Follow Up Recommendations  SNF     Equipment Recommendations       Recommendations for Other Services       Precautions / Restrictions Precautions Precautions: Fall Restrictions Weight Bearing Restrictions: No    Mobility  Bed Mobility Overal bed mobility: Needs Assistance Bed Mobility: Supine to Sit     Supine to sit: Supervision;HOB elevated     General bed mobility comments: 25%VC's for safety/impulsive  Transfers Overall transfer level: Needs assistance Equipment used: Straight cane Transfers: Sit to/from Stand Sit to Stand: Min assist         General transfer comment: Assist for balance with pt standing quickly at times. Cued to slow down.Pt has cane but does not use functionally during ambulation without cueing.   Ambulation/Gait Ambulation/Gait assistance: Min assist;Mod assist Ambulation Distance (Feet): 82 Feet Assistive device: Straight cane;1 person hand held assist Gait Pattern/deviations: Step-to pattern;Step-through pattern;Narrow base of support;Trunk flexed Gait  velocity: WFL but too fast for safety   General Gait Details: vary unsteady "drunken" gait with poor self corrective response and inadaquate use of cane 75% of time.  Lateral LOB x 2 therapist recovered. HIGH FALL RISK   Stairs            Wheelchair Mobility    Modified Rankin (Stroke Patients Only)       Balance                                    Cognition Arousal/Alertness: Awake/alert Behavior During Therapy: Flat affect                        Exercises      General Comments        Pertinent Vitals/Pain Pain Assessment: 0-10 Pain Score: 5  Pain Location: back and neck Pain Descriptors / Indicators: Constant Pain Intervention(s): Monitored during session;Repositioned    Home Living                      Prior Function            PT Goals (current goals can now be found in the care plan section)      Frequency  Min 3X/week    PT Plan Current plan remains appropriate    Co-evaluation             End of Session Equipment Utilized During Treatment: Gait belt Activity Tolerance:  Patient tolerated treatment well Patient left: in chair;with call bell/phone within reach;with chair alarm set     Time: 2130-8657 PT Time Calculation (min) (ACUTE ONLY): 15 min  Charges:  $Gait Training: 8-22 mins                    G Codes:      Felecia Shelling  PTA [facility]  Acute  Rehab Pager      [phone]

## 2014-12-16 NOTE — Discharge Summary (Signed)
Physician Discharge Summary  Ronald BLITCH Sr. ZOX:[id number] DOB: 1956-03-28 DOA: 12/12/2014  PCP: Eustaquio Boyden, MD  Admit date: 12/12/2014 Discharge date: 12/16/2014  Time spent: 40 minutes  Recommendations for Outpatient Follow-up:  1. Follow-up with the nursing home M.D. 2. Augmentin for 5 more days.  Discharge Diagnoses:  Principal Problem:   Aspiration pneumonia Active Problems:   Cognitive impairment   Tobacco abuse   History of alcohol abuse   Depression   Seizure   Recurrent seizures   COPD bronchitis   Protein-calorie malnutrition   Protein-calorie malnutrition, severe   Discharge Condition: Stable  Diet recommendation: Heart healthy, dysphagia 3 with thin liquids.  Filed Weights   12/12/14 2157 12/13/14 0200  Weight: 51.256 kg (113 lb) 58.3 kg (128 lb 8.5 oz)    History of present illness:  Ronald STOUDT Sr. is a 59 y.o. male with PMH of seizure, tobacco abuse, COPD, depression, chronic back pain, who presents with AMS and recurrent seizure.  Patient has AMS and is unable to provide much history. Therefore, most of the history is obtained by discussing the case with the ED physician and also per EMS report. Patient was recently hospitalized from 6/29 to 7/1 due to recurrent seizure. He was just discharged yesterday. He had negative MRI of brain and unremarkable EEG in previous admission. Neurology was consulted in previous admission and recommended to cont keppra 1000 mg bid and added depakote 500 BID.   Per EMS report, EMS was called to the home due to "being suicidal". Once they arrived, patient was confused and then noted to have a generalized tonic-clonic seizure that lasted about one minute. Upon arrival to the ED he is not able to answer questions initially. Her mental statu improved later in ED. When I saw patient in ED, he is quiet, was able to answer some questions. He does not have pain anywhere, denies suicidal or homicidal ideations. He seems to have  no chest pain, shortness breath, abdominal pain, diarrhea, symptoms of UTI, unilateral weakness.  In ED, patient was found to have WBC 11.0, negative UDS, temperature normal, no tachycardia, electrolytes okay. Patient is admitted to inpatient for further evaluation and treatment. Neurology was consulted  Hospital Course:   Aspiration pneumonia Consult speech therapist to dose swallow evaluation, recommended dysphagia 3 with thin liquids He had multiple episodes of seizure and so probably aspirated during one of those episodes. Chest x-ray shows right lower lobe infiltrate.  Started on Unasyn while was in the hospital, discharged on Augmentin for 5 more days.  Tachycardia Appears to be sinus tachycardia. Patient denies any chest pain. Likely triggered by his respiratory illness. Beta agonist broncho-dilators probably causing some tachycardia as well. This is resolved heart rate now in the 70s.  Recurrent seizures This is his third hospitalization for seizures in the last 10 days. Withdrawal from benzodiazepine could've contributed.  Keppra and Depakote dose adjusted by neurology and they signed off.  Acute encephalopathy Likely secondary to seizure. He's had numerous neuroimaging studies recently, including MRI. Mental status has improved. WBC is better. It appears that his encephalopathy was most likely due to a postictal state.   History of COPD He was noted to have a mild cough with some wheezing at the time of admission. See above. Changed albuterol to Xopenex. Start Spiriva.  History of depression He apparently mentioned suicidal ideation at home. Psychiatry has been consulted. Appreciate their input. Continue Celexa. Added Remeron at night at 7.5 per psych recommendation..  Moderate protein  calorie malnutrition. Continue ensure  History of alcohol abuse. No recent alcohol intake. Watch for symptoms and signs of withdrawal. Continue thiamine  Benzodiazepine dependence He is  discharged on any benzodiazepines. He was taking Valium at home.  He might have gone through her withdrawal process over the last few days. Currently not exhibiting any signs of withdrawal.  Tobacco abuse. Continue nicotine patch.   Procedures:  Stable  Consultations:  Neurology  Discharge Exam: Filed Vitals:   12/16/14 0907  BP: 117/77  Pulse: 78  Temp: 98.2 F (36.8 C)  Resp: 20   General: Alert and awake, oriented x3, not in any acute distress. HEENT: anicteric sclera, pupils reactive to light and accommodation, EOMI CVS: S1-S2 clear, no murmur rubs or gallops Chest: clear to auscultation bilaterally, no wheezing, rales or rhonchi Abdomen: soft nontender, nondistended, normal bowel sounds, no organomegaly Extremities: no cyanosis, clubbing or edema noted bilaterally Neuro: Cranial nerves II-XII intact, no focal neurological deficits  Discharge Instructions   Discharge Instructions    Diet - low sodium heart healthy    Complete by:  As directed      Increase activity slowly    Complete by:  As directed           Current Discharge Medication List    START taking these medications   Details  amoxicillin-clavulanate (AUGMENTIN) 875-125 MG per tablet Take 1 tablet by mouth 2 (two) times daily. Qty: 10 tablet, Refills: 0    divalproex (DEPAKOTE ER) 250 MG 24 hr tablet Take 3 tablets (750 mg total) by mouth 2 (two) times daily.    mirtazapine (REMERON) 7.5 MG tablet Take 1 tablet (7.5 mg total) by mouth at bedtime.      CONTINUE these medications which have CHANGED   Details  levETIRAcetam (KEPPRA) 750 MG tablet Take 2 tablets (1,500 mg total) by mouth 2 (two) times daily.      CONTINUE these medications which have NOT CHANGED   Details  acetaminophen (TYLENOL) 500 MG tablet Take 500 mg by mouth every 6 (six) hours as needed for moderate pain.     citalopram (CELEXA) 40 MG tablet Take 1 tablet (40 mg total) by mouth at bedtime. Qty: 30 tablet, Refills: 3     feeding supplement, ENSURE COMPLETE, (ENSURE COMPLETE) LIQD Take 237 mLs by mouth 3 (three) times daily between meals. Qty: 30 Bottle, Refills: 3    albuterol (PROVENTIL HFA;VENTOLIN HFA) 108 (90 BASE) MCG/ACT inhaler Inhale 2 puffs into the lungs every 6 (six) hours as needed for wheezing or shortness of breath. Qty: 1 Inhaler, Refills: 2    aspirin EC 81 MG tablet Take 1 tablet (81 mg total) by mouth daily. Qty: 30 tablet, Refills: 0    vitamin B-12 1000 MCG tablet Take 1 tablet (1,000 mcg total) by mouth daily. Qty: 60 tablet, Refills: 0      STOP taking these medications     divalproex (DEPAKOTE) 500 MG DR tablet      divalproex (DEPAKOTE) 250 MG DR tablet      oxyCODONE-acetaminophen (PERCOCET/ROXICET) 5-325 MG per tablet        Allergies  Allergen Reactions  . Antihistamines, Diphenhydramine-Type Other (See Comments)    Speeds up heart rate.  . Codeine Nausea And Vomiting   Follow-up Information    Follow up with Eustaquio Boyden, MD In 2 weeks.   Specialty:  Family Medicine   Contact information:   [address] [place] [address] [phone]  The results of significant diagnostics from this hospitalization (including imaging, microbiology, ancillary and laboratory) are listed below for reference.    Significant Diagnostic Studies: Dg Chest 2 View  12/09/2014   CLINICAL DATA:  59 year old male with seizure and altered mental status  EXAM: CHEST  2 VIEW  COMPARISON:  Prior chest x-ray 11/28/2014  FINDINGS: Cardiac and mediastinal contours remain within normal limits. Trace atherosclerotic calcification in the transverse aorta. The lungs remain hyperinflated with increased retrosternal sclera space, areas of hyperlucency and mild bronchitic change. Findings suggest emphysema and COPD. No focal airspace consolidation, pleural effusion, pneumothorax or pulmonary nodule. No acute osseous abnormality.  IMPRESSION: Stable chest x-ray without  evidence of acute cardiopulmonary process.  Emphysema and probable COPD.   Electronically Signed   By: Malachy MoanHeath  McCullough M.D.   On: 12/09/2014 20:35   Dg Cervical Spine 2 Or 3 Views  12/10/2014   CLINICAL DATA:  Chronic neck pain.  EXAM: CERVICAL SPINE - 2-3 VIEW  COMPARISON:  CT 12/07/2014  FINDINGS: There is anterior interbody fusion with fixation hardware from C4 through C6. The hardware appears intact. No fracture or other acute abnormality is evident. Moderate degenerative disc changes are present at C3-4 with posterior osteophytes. There is no bone lesion or bony destruction.  C7 is not well seen, except for its normal spinous process. It was seen to good advantage on the CT of 12/07/2014, appearing normal except for moderate degenerative disc changes at the C6-7 interspace.  IMPRESSION: Intact appearances of the C4 through C6 interbody fusion with anterior fixation hardware. No acute findings are evident. Moderate degenerative disc changes at C3-4 and at C6-7.   Electronically Signed   By: Ellery Plunkaniel R Mitchell M.D.   On: 12/10/2014 01:11   Ct Head Wo Contrast  12/07/2014   CLINICAL DATA:  Altered mental status a while mowing the yd, history of seizures, smoking  EXAM: CT HEAD WITHOUT CONTRAST  TECHNIQUE: Contiguous axial images were obtained from the base of the skull through the vertex without intravenous contrast.  COMPARISON:  07/28/2014  FINDINGS: Generalized atrophy.  Normal ventricular morphology.  No midline shift or mass effect.  Normal appearance of brain parenchyma.  No intracranial hemorrhage, mass lesion or evidence acute infarction.  No extra-axial fluid collections.  Sinuses clear and bones unremarkable.  IMPRESSION: No acute intracranial abnormalities.  No interval change.   Electronically Signed   By: Ulyses SouthwardMark  Boles M.D.   On: 12/07/2014 16:31   Ct Cervical Spine Wo Contrast  12/07/2014   CLINICAL DATA:  Seizure. History of C3 vertebral body fracture. History of prior cervical fusion.   EXAM: CT CERVICAL SPINE WITHOUT CONTRAST  TECHNIQUE: Multidetector CT imaging of the cervical spine was performed without intravenous contrast. Multiplanar CT image reconstructions were also generated.  COMPARISON:  04/30/2014  FINDINGS: Previously identified fracture of the C3 vertebral body is now healed. There is evidence of prior cervical fusion at C4-5 and C5-6 with stable appearance of fusion hardware. The C4-5 levels shows complete bony fusion. The C5-6 level is not completely fused. Appearance is stable. No evidence of listhesis or soft tissue swelling. The visualized airway is normally patent. Emphysematous changes are seen at both lung apices.  IMPRESSION: No acute fracture identified. Previous C3 fracture shows interval healing. Stable appearance status post prior cervical fusion at C4-5 and C5-6.   Electronically Signed   By: Irish LackGlenn  Yamagata M.D.   On: 12/07/2014 20:59   Mr Laqueta JeanBrain W OZWo Contrast  12/10/2014   CLINICAL DATA:  History of seizures, breakthrough seizures, possible non compliant. Confusion and hallucinations today. History of alcohol abuse, essential tremor.  EXAM: MRI HEAD WITHOUT AND WITH CONTRAST  TECHNIQUE: Multiplanar, multiecho pulse sequences of the brain and surrounding structures were obtained without and with intravenous contrast.  CONTRAST:  10mL MULTIHANCE GADOBENATE DIMEGLUMINE 529 MG/ML IV SOLN  COMPARISON:  CT head December 07, 2014 an MRI of the brain April 10, 2014  FINDINGS: Motion degraded examination. Post gadolinium sequences are moderate to severely motion degraded.  Mild prominence of the ventricles and sulci, similar to prior MRI. No abnormal parenchymal signal, mass lesions, mass effect. No abnormal parenchymal enhancement. No reduced diffusion to suggest acute ischemia. No susceptibility artifact to suggest hemorrhage. Limited assessment of the mesial temporal lobes due to motion though, hippocampus demonstrate relatively symmetric size and signal.  No abnormal  extra-axial fluid collections. No extra-axial masses nor leptomeningeal enhancement. Normal major intracranial vascular flow voids seen at the skull base.  Status post bilateral ocular lens implants. No abnormal sellar expansion. Trace LEFT mastoid effusion, paranasal sinuses are well aerated. No suspicious calvarial bone marrow signal. No abnormal sellar expansion. Craniocervical junction maintained. Patient is edentulous.  IMPRESSION: Motion degraded examination without acute intracranial process or suspicious enhancement.  Mild global parenchymal brain volume loss.   Electronically Signed   By: Awilda Metro M.D.   On: 12/10/2014 05:09   Dg Chest Port 1 View  12/15/2014   CLINICAL DATA:  Seizure disorder.  COPD.  EXAM: PORTABLE CHEST - 1 VIEW  COMPARISON:  12/09/2014 .  FINDINGS: Mediastinum and hilar structures are normal. Heart size and pulmonary vascularity normal. Mild right lower lobe infiltrate consistent with pneumonia. Small bilateral pleural effusions. COPD. No pneumothorax. Cervical spine fusion .  IMPRESSION: 1. Mild right lower lobe infiltrate consistent pneumonia.  2.  Small bilateral pleural effusions.  3.  COPD.   Electronically Signed   By: Maisie Fus  Register   On: 12/15/2014 08:13   Dg Chest Port 1 View  11/28/2014   CLINICAL DATA:  Shortness of breath  EXAM: PORTABLE CHEST - 1 VIEW  COMPARISON:  07/28/2014  FINDINGS: The heart size and mediastinal contours are within normal limits. Both lungs are clear. The visualized skeletal structures are unremarkable. Cervical fusion hardware is noted. Lungs are mildly hyperinflated which may be seen with emphysema.  IMPRESSION: No active disease.   Electronically Signed   By: Christiana Pellant M.D.   On: 11/28/2014 16:09    Microbiology: Recent Results (from the past 240 hour(s))  MRSA PCR Screening     Status: None   Collection Time: 12/07/14  7:39 PM  Result Value Ref Range Status   MRSA by PCR NEGATIVE NEGATIVE Final    Comment:        The  GeneXpert MRSA Assay (FDA approved for NASAL specimens only), is one component of a comprehensive MRSA colonization surveillance program. It is not intended to diagnose MRSA infection nor to guide or monitor treatment for MRSA infections.      Labs: Basic Metabolic Panel:  Recent Labs Lab 12/12/14 0230 12/12/14 2300 12/13/14 0721 12/14/14 0445 12/15/14 0800 12/16/14 0519  NA 139 136  --  136 134* 137  K 4.2 3.9  --  3.7 3.7 3.8  CL 99* 98*  --  100* 96* 98*  CO2 29 27  --  GLUCOSE 100* 109*  --  89 120* 72  BUN 22* 10  --  CREATININE  0.56* 0.52*  --  0.56* 0.57* 0.49*  CALCIUM 9.5 9.1  --  8.7* 8.7* 9.1  MG  --   --  1.8  --  1.5*  --   PHOS  --   --  3.2  --   --   --    Liver Function Tests:  Recent Labs Lab 12/09/14 2013 12/12/14 2300 12/14/14 0445  AST 20 24 16   ALT 15* 16* 13*  ALKPHOS 55 56 45  BILITOT 0.9 0.6 0.5  PROT 6.2* 6.0* 5.7*  ALBUMIN 3.7 3.3* 3.0*    Recent Labs Lab 12/09/14 2013  LIPASE 19*    Recent Labs Lab 12/10/14 0026 12/11/14 0433  AMMONIA 37* 16   CBC:  Recent Labs Lab 12/12/14 0230 12/12/14 2300 12/14/14 0445 12/15/14 0800 12/16/14 0519  WBC 8.1 11.0* 7.8 14.1* 12.4*  NEUTROABS 5.7  --   --   --   --   HGB 12.3* 12.4* 11.4* 12.5* 12.1*  HCT 36.8* 36.4* 34.4* 36.9* 35.6*  MCV 96.1 97.1 95.8 96.6 98.6  PLT 195 202 183 217 194   Cardiac Enzymes:  Recent Labs Lab 12/10/14 0027 12/10/14 0609 12/10/14 1202 12/15/14 0800  TROPONINI <0.03 <0.03 <0.03 <0.03   BNP: BNP (last 3 results) No results for input(s): BNP in the last 8760 hours.  ProBNP (last 3 results) No results for input(s): PROBNP in the last 8760 hours.  CBG:  Recent Labs Lab 12/14/14 0630 12/15/14 0638 12/16/14 0544 12/16/14 0603 12/16/14 0750  GLUCAP 75 103* 67 83 68       Signed:  Cordelle Dahmen A  [facility] 12/16/2014, 11:27 AM

## 2014-12-16 NOTE — Progress Notes (Signed)
Hypoglycemic Event  CBG: 67  Treatment: 15 GM carbohydrate snack  Symptoms: None  Follow-up CBG: Time:0600 CBG Result:83  Possible Reasons for Event: Unknown  Comments/MD notified:yes    Janee Mornhompson, Vauda Salvucci E  Remember to initiate Hypoglycemia Order Set & complete

## 2014-12-16 NOTE — Progress Notes (Signed)
Pt for discharge [facility] today. Discharge orders received. IV and telemetry dcd with dressing clean dry and intact. [facility] by [facility] at 1450.

## 2014-12-16 NOTE — Clinical Social Work Note (Signed)
Clinical Social Worker spoke with patient's sister, Dois DavenportSandra in reference to post-acute placement. Pt's sister informed of [facility]   CSW will continue to follow patient and pt's family for continued support and to facilitate pt's discharge needs once medically stable.   Derenda FennelBashira Kenley Troop, MSW, LCSWA [phone] 12/16/2014 11:10 AM

## 2014-12-16 NOTE — Clinical Social Work Note (Signed)
Clinical Social Worker facilitated patient discharge including contacting patient family and facility to confirm patient discharge plans.  Clinical information faxed to facility and family agreeable with plan.  CSW arranged ambulance transport via [facility] to [facility].  RN to call report prior to discharge.  DC packet prepared and on chat for transport with number for report.   Clinical Social Worker will sign off for now as social work intervention is no longer needed. Please consult us again if new need arises.  Derenda FennelBashira Jenasia Dolinar, MSW, LCSWA [phone] 12/16/2014 12:25 PM

## 2014-12-16 NOTE — Progress Notes (Signed)
Speech Language Pathology Treatment: Dysphagia  Patient Details Name: Ronald HOHN Sr. MRN: [id number] DOB: 06-05-1956 Today's Date: 12/16/2014 Time: 9672-2773 SLP Time Calculation (min) (ACUTE ONLY): 10 min  Assessment / Plan / Recommendation Clinical Impression  Pt for D/C today to [facility].  F/u after yesterday's swallow assessment - tolerating regular consistencies and thin liquids well with improved attention to bolus, adequate mastication, brisk swallow response, and no s/s of aspiration.  Pt safe to remain on regular diet with thin liquids.  No SLP f/u at SNF is warranted.  Will sign off.    HPI Other Pertinent Information: Patient was recently hospitalized from 6/29 to 7/1 due to recurrent seizure. EMS was called 12/12/14 to the home due to "being suicidal". Once they arrived, patient was confused and then noted to have a generalized tonic-clonic seizure that lasted about one minute. WBC 11.0, chest x-ray revealed mild right lower lobe infiltrate consistent with pneumonia, small bilateral pleural effusions and COPD.   Pertinent Vitals Pain Assessment: No/denies pain Pain Score: 5  Pain Location: back and neck Pain Descriptors / Indicators: Constant Pain Intervention(s): Monitored during session;Repositioned  SLP Plan  All goals met    Recommendations Diet recommendations: Regular;Thin liquid Liquids provided via: Cup;Straw Medication Administration: Whole meds with liquid Supervision: Patient able to self feed Compensations: Minimize environmental distractions Postural Changes and/or Swallow Maneuvers: Seated upright 90 degrees              Oral Care Recommendations: Oral care BID Follow up Recommendations: None Plan: All goals met    GO     Ronald [name] 12/16/2014, 1:26 PM

## 2014-12-16 NOTE — Clinical Social Work Placement (Signed)
   CLINICAL SOCIAL WORK PLACEMENT  NOTE  Date:  12/16/2014  Patient Details  Name: Ronald MarcusSamuel E Geigle Sr. MRN: [id number] Date of Birth: 11-13-1955  Clinical Social Work is seeking post-discharge placement for this patient at the Skilled  Nursing Facility level of care (*CSW will initial, date and re-position this form in  chart as items are completed):  Yes   Patient/family provided with [facility] Clinical Social Work Department's list of facilities offering this level of care within the geographic area requested by the patient (or if unable, by the patient's family).  Yes   Patient/family informed of their freedom to choose among providers that offer the needed level of care, that participate in Medicare, Medicaid or managed care program needed by the patient, have an available bed and are willing to accept the patient.  Yes   Patient/family informed of [facility]'s ownership interest in [facility] and [facility], as well as of the fact that they are under no obligation to receive care at these facilities.  PASRR submitted to EDS on 12/14/14     PASRR number received on       Existing PASRR number confirmed on       FL2 transmitted to all facilities in geographic area requested by pt/family on       FL2 transmitted to all facilities within larger geographic area on 12/14/14     Patient informed that his/her managed care company has contracts with or will negotiate with certain facilities, including the following:        Yes   Patient/family informed of bed offers received.  Patient chooses bed at  [facility] )     Physician recommends and patient chooses bed at      Patient to be transferred to  [facility] ) on 12/16/14.  Patient to be transferred to facility by  Sharin Mons([facility] )     Patient family notified on 12/16/14 of transfer.  Name of family member notified:   (Pt's sister, Ronald [name] )     PHYSICIAN Please sign FL2     Additional  Comment:    _______________________________________________ Derenda FennelBashira Merdis Snodgrass, MSW, LCSWA [phone] 12/16/2014 12:24 PM

## 2014-12-17 ENCOUNTER — Telehealth: Payer: Self-pay | Admitting: Family Medicine

## 2014-12-17 ENCOUNTER — Non-Acute Institutional Stay (SKILLED_NURSING_FACILITY): Payer: Medicare Other | Admitting: Internal Medicine

## 2014-12-17 ENCOUNTER — Encounter: Payer: Self-pay | Admitting: Internal Medicine

## 2014-12-17 DIAGNOSIS — G40909 Epilepsy, unspecified, not intractable, without status epilepticus: Secondary | ICD-10-CM | POA: Diagnosis not present

## 2014-12-17 DIAGNOSIS — R410 Disorientation, unspecified: Secondary | ICD-10-CM

## 2014-12-17 DIAGNOSIS — I471 Supraventricular tachycardia: Secondary | ICD-10-CM | POA: Diagnosis not present

## 2014-12-17 DIAGNOSIS — J690 Pneumonitis due to inhalation of food and vomit: Secondary | ICD-10-CM | POA: Diagnosis not present

## 2014-12-17 DIAGNOSIS — R1310 Dysphagia, unspecified: Secondary | ICD-10-CM

## 2014-12-17 DIAGNOSIS — F329 Major depressive disorder, single episode, unspecified: Secondary | ICD-10-CM | POA: Diagnosis not present

## 2014-12-17 DIAGNOSIS — E43 Unspecified severe protein-calorie malnutrition: Secondary | ICD-10-CM | POA: Diagnosis not present

## 2014-12-17 DIAGNOSIS — F1010 Alcohol abuse, uncomplicated: Secondary | ICD-10-CM | POA: Diagnosis not present

## 2014-12-17 DIAGNOSIS — R000 Tachycardia, unspecified: Secondary | ICD-10-CM | POA: Insufficient documentation

## 2014-12-17 DIAGNOSIS — Z720 Tobacco use: Secondary | ICD-10-CM | POA: Diagnosis not present

## 2014-12-17 DIAGNOSIS — F1011 Alcohol abuse, in remission: Secondary | ICD-10-CM

## 2014-12-17 DIAGNOSIS — F32A Depression, unspecified: Secondary | ICD-10-CM

## 2014-12-17 DIAGNOSIS — F1320 Sedative, hypnotic or anxiolytic dependence, uncomplicated: Secondary | ICD-10-CM

## 2014-12-17 LAB — OPIATES CONFIRMATION, URINE: OPIATES: NEGATIVE

## 2014-12-17 NOTE — Assessment & Plan Note (Addendum)
At hospital ST recDys 3 plus thin and [facility]

## 2014-12-17 NOTE — Telephone Encounter (Signed)
Patient discharged on 12/16/2014 for recurrent seizures. Discharged to nursing home.  Plz call for hosp f/u phone call and schedule f/u appt in 2 week(s) if out of nursing home - get updated plan for NH stay, would try to touch base with daughter/sister/son first.  Thanks.

## 2014-12-17 NOTE — Assessment & Plan Note (Addendum)
Third hospitalization in 10 days for seizure;He had negative MRI of brain and unremarkable EEG in previous admission. Neurology was consulted in previous admission and recommended to cont keppra 1000 mg bid and this admission added depakote 500 BID; will cont meds and [facility]

## 2014-12-17 NOTE — Assessment & Plan Note (Addendum)
Reported no  recent alcohol intake. Watch for symptoms and signs of withdrawal. Continue thiamine

## 2014-12-17 NOTE — Assessment & Plan Note (Addendum)
He was not  discharged on any benzodiazepines. He might have gone through her withdrawal process while hospitalized.  Not exhibiting any signs of withdrawal on d/c to SNF.; cont monitor for withdrawal at SNF and start valium 2 mg TID, will titrate up if needed

## 2014-12-17 NOTE — Assessment & Plan Note (Addendum)
Diet + supplements [facility]

## 2014-12-17 NOTE — Assessment & Plan Note (Addendum)
[facility]  [facility]

## 2014-12-17 NOTE — Assessment & Plan Note (Signed)
Likely secondary to seizure. He's had numerous neuroimaging studies recently, including MRI. Mental status has improved. WBC is better. It appears that his encephalopathy was most likely due to a postictal state.

## 2014-12-17 NOTE — Assessment & Plan Note (Addendum)
Appears to be sinus tachycardia. Patient denies any chest pain. Likely triggered by his respiratory illness. Beta agonist broncho-dilators probably causing some tachycardia as well.  Heart rate now in the 70s prior to coming to SNF

## 2014-12-17 NOTE — Assessment & Plan Note (Addendum)
PHR pt apparently mentioned suicidal ideation at home. Psychiatry has been consulted.  Continue Celexa. Added Remeron at night at 7.5 per psych recommendation; [facility]
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
## 2014-12-17 NOTE — Assessment & Plan Note (Addendum)
PHR -Consultation by speech therapist for swallow evaluation, recommended dysphagia 3 with thin liquids Pt had multiple episodes of seizure and so probably aspirated during one of those episodes. Chest x-ray showed right lower lobe infiltrate.  Started on Unasyn while was in the hospital, discharged on Augmentin for 5 more days [facility]

## 2014-12-18 NOTE — Telephone Encounter (Signed)
Transitional care call attempted.  Left message with patient's sister, Yvonne KendallSandra Brown.  Attempted to reach patient's daughter, Jeanie SewerFrankie Brown.  No answer, no voice mail.

## 2014-12-18 NOTE — Telephone Encounter (Signed)
Ronald KendallSandra Brown returned call.  [name].  The tentative plan is for the patient to undergo PT to increase stability while standing and ambulating.  Also, close diet monitoring to ensure appropriate weight gain.  She was unsure of the potential length of stay.  She plans to follow up with the patient's daughter after she visits Mr. [name] today. She will call back to schedule follow up and update as she obtains more information.

## 2014-12-20 ENCOUNTER — Encounter: Payer: Self-pay | Admitting: Internal Medicine

## 2014-12-21 ENCOUNTER — Encounter: Payer: Self-pay | Admitting: Family Medicine

## 2014-12-26 ENCOUNTER — Encounter: Payer: Self-pay | Admitting: Internal Medicine

## 2015-01-01 ENCOUNTER — Non-Acute Institutional Stay (SKILLED_NURSING_FACILITY): Payer: Medicare Other | Admitting: Nurse Practitioner

## 2015-01-01 ENCOUNTER — Encounter: Payer: Self-pay | Admitting: Nurse Practitioner

## 2015-01-01 DIAGNOSIS — J690 Pneumonitis due to inhalation of food and vomit: Secondary | ICD-10-CM

## 2015-01-01 DIAGNOSIS — E43 Unspecified severe protein-calorie malnutrition: Secondary | ICD-10-CM | POA: Diagnosis not present

## 2015-01-01 DIAGNOSIS — Z720 Tobacco use: Secondary | ICD-10-CM | POA: Diagnosis not present

## 2015-01-01 DIAGNOSIS — G40909 Epilepsy, unspecified, not intractable, without status epilepticus: Secondary | ICD-10-CM | POA: Diagnosis not present

## 2015-01-01 DIAGNOSIS — R410 Disorientation, unspecified: Secondary | ICD-10-CM | POA: Diagnosis not present

## 2015-01-01 DIAGNOSIS — F329 Major depressive disorder, single episode, unspecified: Secondary | ICD-10-CM | POA: Diagnosis not present

## 2015-01-01 DIAGNOSIS — F32A Depression, unspecified: Secondary | ICD-10-CM
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
## 2015-01-05 ENCOUNTER — Telehealth: Payer: Self-pay | Admitting: Family Medicine

## 2015-01-05 NOTE — Telephone Encounter (Signed)
Pt was d/c from [facility],  they had him on valium 2 mg 3 times a day for Also remeron 7.5 mg at bedtime,  He was getting these medications at [facility], do you want to continue Also, pt is out of oxycodone 5/325, do you want to continue?   if so, can son come pick up? Otherwise he uses [facility] - [phone]  Pain level 8/10 for back  2 week 3  1 week 1 - needs verbal orders for nursing (medication teaching) Call back number confidential [phone]

## 2015-01-06 MED ORDER — OXYCODONE-ACETAMINOPHEN 10-325 MG PO TABS: 1.0000 | ORAL_TABLET | Freq: Four times a day (QID) | ORAL | Status: DC | PRN

## 2015-01-06 MED ORDER — DIAZEPAM 2 MG PO TABS: 2.0000 mg | ORAL_TABLET | Freq: Three times a day (TID) | ORAL | Status: DC

## 2015-01-06 MED ORDER — MIRTAZAPINE 7.5 MG PO TABS: 7.5000 mg | ORAL_TABLET | Freq: Every day | ORAL | Status: DC

## 2015-01-06 NOTE — Telephone Encounter (Signed)
Dr. Sharen Hones out of the office this week.  Okay to refill medications or hold for Dr. Reece Agar?

## 2015-01-06 NOTE — Telephone Encounter (Signed)
Left voicemail on Debbie (nurse with [facility]) letting her know Dr. Royden Purl comments. Rxs placed at front for pick up

## 2015-01-06 NOTE — Telephone Encounter (Signed)
Spoke with Eunice Blase and she advise me that pt had an old Rx bottle for the oxycodone and is requesting that we refill it again. Pt is having chronic pain, and is in a lot of pain now. Eunice Blase would also like verbal orders from prev note and also a Mudlogger verbal order to see what help pt can get with helping take care of his home Debbie request call back at [phone] (can leave VM if no answer)

## 2015-01-06 NOTE — Telephone Encounter (Signed)
Please ok the verbal orders  Will print px for pick up  Can do limited amt of oxycodone and that can be re eval by pcp when he returns

## 2015-01-06 NOTE — Telephone Encounter (Signed)
See other phone note.  Defer conversation with PCP until he is back in town.  Thanks.

## 2015-01-06 NOTE — Telephone Encounter (Signed)
I did not see oxycodone on his list from the nursing home note- please find out if he is taking or needs it

## 2015-01-06 NOTE — Telephone Encounter (Signed)
Daughter called, would like a call back regarding pt.  He has been in [facility] nursing [facility] due to multiple hospital stays, seizures, pneumonia and other things.Daughter states that pt has stopped taking valium and has been through withdrawals while in [place].  Daughter does not want him on valium again.  Pt has now been released from [place] and daughter would like to speak with you regarding future health care.  Call back number is [phone].

## 2015-01-07 NOTE — Telephone Encounter (Signed)
Called to let daughter know when Dr Reece Agar will be back in office, n/a mailbox full

## 2015-01-11 ENCOUNTER — Telehealth: Payer: Self-pay | Admitting: Family Medicine

## 2015-01-11 ENCOUNTER — Other Ambulatory Visit: Payer: Self-pay | Admitting: Family Medicine

## 2015-01-11 NOTE — Telephone Encounter (Signed)
erin from [facility]  Verbal order for home health pt  1x week for 1 week 2x week for 2 weeks [phone]

## 2015-01-11 NOTE — Telephone Encounter (Signed)
Gar Gibbon Trammel social worker with [facility] left v/m; pt does not have a phone now and Thurston Hole said pt is requesting rx oxycodone apap. Last printed # 10 on 01/06/15. Pt only has one pill left. Contact pts sister [name] at [phone] when rx ready for pick up.Please advise. Last seen 12/01/14.

## 2015-01-11 NOTE — Telephone Encounter (Signed)
Spoke with Debbie. She said his discharge papers from [facility] said he was supposed to be taking the Valium, but she was under the impression that he was weaning/weaned off of it. I tried calling the patient to find out. No answer and no mailbox has been set up to leave message. Will try again later.

## 2015-01-11 NOTE — Telephone Encounter (Addendum)
Plz call debbie for an update - was he on valium at [facility]? I thought he should have been completely weaned off benzo as benzo withdrawals was thought to be contributing to his seizures. How is he taking valium now?

## 2015-01-12 ENCOUNTER — Telehealth: Payer: Self-pay

## 2015-01-12 MED ORDER — OXYCODONE-ACETAMINOPHEN 10-325 MG PO TABS: 1.0000 | ORAL_TABLET | Freq: Four times a day (QID) | ORAL | Status: DC | PRN

## 2015-01-12 NOTE — Telephone Encounter (Signed)
Patient's sister notified and Rx placed up front for pick up.
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
## 2015-01-12 NOTE — Telephone Encounter (Signed)
Message left for Ronald [name] to return my call.

## 2015-01-12 NOTE — Telephone Encounter (Signed)
Attempted to cal patient. No answer and mailbox not set up. Unable to leave message. Will try again later.

## 2015-01-12 NOTE — Telephone Encounter (Signed)
Printed and in Kim's box. Printed 2 week supply to last until pt comes in to see [place], and as he has lost last several controlled substance prescriptions and pill bottles in the last few months.

## 2015-01-12 NOTE — Telephone Encounter (Signed)
Noted. Ok for further Child psychotherapist visits.

## 2015-01-12 NOTE — Telephone Encounter (Signed)
plz check with sister - how is he taking valium?  See other phone note.

## 2015-01-12 NOTE — Telephone Encounter (Signed)
Patient no longer has phone per other phone note. Spoke with patient's sister. She thinks he has diazepam at home, but she isn't 100% sure. She thinks he got #60 filled by a PA, but she isn't understanding why if he was supposed to quit taking them . I advised we had no record of a refill in his chart after Dr.G's in June, so she nor I could figure out where he got it. She can't get to his house today to check, but she is going to see if his landlord can check and also get the name of the prescriber for me and call me back. Will await return call.

## 2015-01-12 NOTE — Telephone Encounter (Signed)
AnnMarie notified

## 2015-01-13 NOTE — Telephone Encounter (Signed)
Pt left v/m requesting status of diazepam refill. 

## 2015-01-13 NOTE — Telephone Encounter (Signed)
Message left [place] [facility] with Erin.

## 2015-01-13 NOTE — Telephone Encounter (Signed)
Spoke with sister and appt scheduled for tomorrow to review meds.

## 2015-01-13 NOTE — Telephone Encounter (Signed)
Sister called back Ronald [name]. She will be at work until 530 phone number is [phone] ext [phone] thanks

## 2015-01-13 NOTE — Telephone Encounter (Signed)
Spoke with sister. She got in touch with patient's daughter who actually went to [facility] and talked to one of the nurses that provided care for patient. She assured her that he was never given valium while there. I looked at the notes and admission notes specifically state that he was discharged from the hospital with no benzos, but then his discharge summary from [facility] lists it on his med list. Dr. Milinda Antis wrote him a script on 01/06/15 for 1 TID because she was under the impression he was supposed to be taking it. So, now everyone is confused if he is the one actually taking it or if his son may be taking it from him. The Rx for the oxy is still here and hasn't been picked up. I made him an appt for tomorrow to discuss these meds and his sister is going to try to work transportation out for him. If she can't, she may have to cancel the appt.

## 2015-01-14 ENCOUNTER — Ambulatory Visit (INDEPENDENT_AMBULATORY_CARE_PROVIDER_SITE_OTHER): Payer: Medicare Other | Admitting: Family Medicine

## 2015-01-14 ENCOUNTER — Encounter: Payer: Self-pay | Admitting: Family Medicine

## 2015-01-14 VITALS — BP 118/70 | HR 80 | Temp 97.7°F | Wt 125.0 lb

## 2015-01-14 DIAGNOSIS — G4089 Other seizures: Secondary | ICD-10-CM | POA: Diagnosis not present

## 2015-01-14 DIAGNOSIS — J439 Emphysema, unspecified: Secondary | ICD-10-CM | POA: Diagnosis not present

## 2015-01-14 DIAGNOSIS — M549 Dorsalgia, unspecified: Secondary | ICD-10-CM

## 2015-01-14 DIAGNOSIS — G250 Essential tremor: Secondary | ICD-10-CM

## 2015-01-14 DIAGNOSIS — F1320 Sedative, hypnotic or anxiolytic dependence, uncomplicated: Secondary | ICD-10-CM | POA: Diagnosis not present

## 2015-01-14 DIAGNOSIS — J449 Chronic obstructive pulmonary disease, unspecified: Secondary | ICD-10-CM | POA: Insufficient documentation

## 2015-01-14 DIAGNOSIS — G8929 Other chronic pain: Secondary | ICD-10-CM

## 2015-01-14 DIAGNOSIS — J690 Pneumonitis due to inhalation of food and vomit: Secondary | ICD-10-CM

## 2015-01-14 DIAGNOSIS — Z720 Tobacco use: Secondary | ICD-10-CM

## 2015-01-14 DIAGNOSIS — F32A Depression, unspecified: Secondary | ICD-10-CM

## 2015-01-14 DIAGNOSIS — E43 Unspecified severe protein-calorie malnutrition: Secondary | ICD-10-CM

## 2015-01-14 DIAGNOSIS — R4189 Other symptoms and signs involving cognitive functions and awareness: Secondary | ICD-10-CM

## 2015-01-14 DIAGNOSIS — G40909 Epilepsy, unspecified, not intractable, without status epilepticus: Secondary | ICD-10-CM

## 2015-01-14 DIAGNOSIS — F329 Major depressive disorder, single episode, unspecified: Secondary | ICD-10-CM

## 2015-01-14 LAB — CBC WITH DIFFERENTIAL/PLATELET
Basophils Absolute: 0 10*3/uL (ref 0.0–0.1)
Basophils Relative: 0.3 % (ref 0.0–3.0)
Eosinophils Absolute: 0.3 10*3/uL (ref 0.0–0.7)
Eosinophils Relative: 3.7 % (ref 0.0–5.0)
HEMATOCRIT: 41.7 % (ref 39.0–52.0)
HEMOGLOBIN: 13.8 g/dL (ref 13.0–17.0)
LYMPHS PCT: 23.9 % (ref 12.0–46.0)
Lymphs Abs: 2.2 10*3/uL (ref 0.7–4.0)
MCHC: 33.2 g/dL (ref 30.0–36.0)
MCV: 99.1 fl (ref 78.0–100.0)
MONOS PCT: 6.4 % (ref 3.0–12.0)
Monocytes Absolute: 0.6 10*3/uL (ref 0.1–1.0)
NEUTROS ABS: 6.1 10*3/uL (ref 1.4–7.7)
NEUTROS PCT: 65.7 % (ref 43.0–77.0)
Platelets: 194 10*3/uL (ref 150.0–400.0)
RBC: 4.21 Mil/uL — ABNORMAL LOW (ref 4.22–5.81)
RDW: 14.8 % (ref 11.5–15.5)
WBC: 9.3 10*3/uL (ref 4.0–10.5)

## 2015-01-14 LAB — COMPREHENSIVE METABOLIC PANEL
ALT: 10 U/L (ref 0–53)
AST: 17 U/L (ref 0–37)
Albumin: 4.4 g/dL (ref 3.5–5.2)
Alkaline Phosphatase: 57 U/L (ref 39–117)
BUN: 7 mg/dL (ref 6–23)
CO2: 36 meq/L — AB (ref 19–32)
Calcium: 9.8 mg/dL (ref 8.4–10.5)
Chloride: 94 mEq/L — ABNORMAL LOW (ref 96–112)
Creatinine, Ser: 0.66 mg/dL (ref 0.40–1.50)
GFR: 131.97 mL/min (ref >60.00)
Glucose, Bld: 68 mg/dL — ABNORMAL LOW (ref 70–99)
POTASSIUM: 4.4 meq/L (ref 3.5–5.1)
SODIUM: 134 meq/L — AB (ref 135–145)
Total Bilirubin: 0.3 mg/dL (ref 0.2–1.2)
Total Protein: 7.6 g/dL (ref 6.0–8.3)

## 2015-01-14 NOTE — Patient Instructions (Addendum)
Stop valium. Continue celexa and remeron for mood and nerves and appetite.  Pick up oxycodone prescription up front. Call me when you are running low on the medicine for a refill. Take medicine only as needed for moderate to severe pain - try to use sparingly. Return to see me in 2 weeks at alreadly scheduled appointment. Keep using nicotine paches. labwork today.

## 2015-01-14 NOTE — Assessment & Plan Note (Signed)
Continue percocet 10/325mg . Pt picked up Rx for #60 tablets today. Discussed controlled substance use and expectations for continued refills. Discussed importance of taking medication as prescribed and need to keep medication safe. Pt states he will have neighbors who have lock/safe to keep meds at their house. Prescribed QID PRN but recommended he use this sparingly.

## 2015-01-14 NOTE — Assessment & Plan Note (Addendum)
Given history of this along with concern for seizure from withdrawal, again discussed recommended discontinuation of all benzo. Will stop valium

## 2015-01-14 NOTE — Assessment & Plan Note (Signed)
Continue nicotine patch started at nursing home.

## 2015-01-14 NOTE — Assessment & Plan Note (Signed)
Better on celexa/remeron

## 2015-01-14 NOTE — Assessment & Plan Note (Signed)
Significant improvement off benzo.

## 2015-01-14 NOTE — Assessment & Plan Note (Signed)
Seems to be improving. Pt states 15 lb weight gain since released from hospital

## 2015-01-14 NOTE — Progress Notes (Signed)
Pre visit review using our clinic review tool, if applicable. No additional management support is needed unless otherwise documented below in the visit note. 

## 2015-01-14 NOTE — Assessment & Plan Note (Signed)
Consider non-benzo medication like primidone. Await neurology input.

## 2015-01-14 NOTE — Assessment & Plan Note (Addendum)
None since discharged from hospital and off valium Check depakote level along with LFTs and CBC. On med for last 1+month
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
## 2015-01-15 LAB — VALPROIC ACID LEVEL: Valproic Acid Lvl: 122.4 ug/mL — ABNORMAL HIGH (ref 50.0–100.0)

## 2015-01-17 ENCOUNTER — Encounter (HOSPITAL_COMMUNITY): Payer: Self-pay | Admitting: Emergency Medicine

## 2015-01-17 ENCOUNTER — Emergency Department (HOSPITAL_COMMUNITY)
Admission: EM | Admit: 2015-01-17 | Discharge: 2015-01-17 | Disposition: A | Discharge status: Home / Self Care | Payer: Medicare Other | Attending: Emergency Medicine | Admitting: Emergency Medicine

## 2015-01-17 DIAGNOSIS — R519 Headache, unspecified: Secondary | ICD-10-CM

## 2015-01-17 DIAGNOSIS — R51 Headache: Secondary | ICD-10-CM | POA: Diagnosis not present

## 2015-01-17 DIAGNOSIS — Z7982 Long term (current) use of aspirin: Secondary | ICD-10-CM | POA: Insufficient documentation

## 2015-01-17 DIAGNOSIS — F329 Major depressive disorder, single episode, unspecified: Secondary | ICD-10-CM | POA: Insufficient documentation

## 2015-01-17 DIAGNOSIS — G8929 Other chronic pain: Secondary | ICD-10-CM | POA: Diagnosis not present

## 2015-01-17 DIAGNOSIS — G40909 Epilepsy, unspecified, not intractable, without status epilepticus: Secondary | ICD-10-CM | POA: Diagnosis not present

## 2015-01-17 DIAGNOSIS — R0789 Other chest pain: Secondary | ICD-10-CM | POA: Insufficient documentation

## 2015-01-17 DIAGNOSIS — Z8619 Personal history of other infectious and parasitic diseases: Secondary | ICD-10-CM | POA: Insufficient documentation

## 2015-01-17 DIAGNOSIS — Z8739 Personal history of other diseases of the musculoskeletal system and connective tissue: Secondary | ICD-10-CM | POA: Diagnosis not present

## 2015-01-17 DIAGNOSIS — R112 Nausea with vomiting, unspecified: Secondary | ICD-10-CM | POA: Insufficient documentation

## 2015-01-17 DIAGNOSIS — Z720 Tobacco use: Secondary | ICD-10-CM | POA: Diagnosis not present

## 2015-01-17 DIAGNOSIS — Z79899 Other long term (current) drug therapy: Secondary | ICD-10-CM | POA: Insufficient documentation

## 2015-01-17 DIAGNOSIS — J441 Chronic obstructive pulmonary disease with (acute) exacerbation: Secondary | ICD-10-CM | POA: Diagnosis not present

## 2015-01-17 LAB — I-STAT CHEM 8, ED
BUN: 17 mg/dL (ref 6–20)
CALCIUM ION: 1.09 mmol/L — AB (ref 1.12–1.23)
Chloride: 95 mmol/L — ABNORMAL LOW (ref 101–111)
Creatinine, Ser: 0.7 mg/dL (ref 0.61–1.24)
Glucose, Bld: 102 mg/dL — ABNORMAL HIGH (ref 65–99)
HEMATOCRIT: 41 % (ref 39.0–52.0)
Hemoglobin: 13.9 g/dL (ref 13.0–17.0)
POTASSIUM: 4.1 mmol/L (ref 3.5–5.1)
Sodium: 130 mmol/L — ABNORMAL LOW (ref 135–145)
TCO2: 31 mmol/L (ref 0–100)

## 2015-01-17 LAB — I-STAT TROPONIN, ED: TROPONIN I, POC: 0.01 ng/mL (ref 0.00–0.08)

## 2015-01-17 MED ORDER — KETOROLAC TROMETHAMINE 30 MG/ML IJ SOLN
30.0000 mg | Freq: Once | INTRAMUSCULAR | Status: AC
  Administered 2015-01-17: 30 mg via INTRAVENOUS
  Filled 2015-01-17: qty 1

## 2015-01-17 MED ORDER — DEXAMETHASONE SODIUM PHOSPHATE 10 MG/ML IJ SOLN
10.0000 mg | Freq: Once | INTRAMUSCULAR | Status: AC
  Administered 2015-01-17: 10 mg via INTRAVENOUS
  Filled 2015-01-17: qty 1

## 2015-01-17 MED ORDER — METOCLOPRAMIDE HCL 5 MG/ML IJ SOLN
10.0000 mg | Freq: Once | INTRAMUSCULAR | Status: AC
  Administered 2015-01-17: 10 mg via INTRAVENOUS
  Filled 2015-01-17: qty 2

## 2015-01-17 NOTE — Discharge Instructions (Signed)

## 2015-01-17 NOTE — ED Notes (Signed)
NAD at this time. Pt is stable and going home.  
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
## 2015-01-17 NOTE — ED Notes (Signed)
Pt reports headache with n/v since 5am today; pt suspects siezure medication is the cause; pt reports takes depakote and keppra for seizures and last seizure was 1 month ago per pt; pt CAOx4 at this time; VSS

## 2015-01-19 ENCOUNTER — Telehealth: Payer: Self-pay | Admitting: Family Medicine

## 2015-01-19 NOTE — Telephone Encounter (Signed)
I assume this is 750 mg bid (short , not long release)- please make sure this is the short release If so- decrease to 500 mg bid (if they are using the 250 mg pills then 2 pills bid) -- call in 1 mo supply if needed and correct med list  If he is on the DELAYED release-please let me know  I will cc to PCP

## 2015-01-19 NOTE — Telephone Encounter (Signed)
-----   Message from Sabino Donovan, CMA sent at 01/19/2015  9:16 AM EDT ----- He takes 750 mg BID. He was just in the office last week and I went over med list in detail. He has no phone at this time. Will have to call his sister, [name] to relay any messages. But he is currently taking  BID.

## 2015-01-20 ENCOUNTER — Encounter: Payer: Self-pay | Admitting: Family Medicine

## 2015-01-20 MED ORDER — DIVALPROEX SODIUM ER 500 MG PO TB24: 500.0000 mg | ORAL_TABLET | Freq: Two times a day (BID) | ORAL | Status: DC

## 2015-01-20 NOTE — Telephone Encounter (Signed)
Then please decrease to depakote ER 500 bid   #60 no refills  F/u with PCP re: checking another level  Also update if any seizures

## 2015-01-20 NOTE — Telephone Encounter (Signed)
Spoke with Ronald [name] and she confirmed that the Depakote is ER.

## 2015-01-20 NOTE — Telephone Encounter (Signed)
Rx sent in as directed. Med list updated. Message left advising patient's sister and Mychart sent as well.

## 2015-01-20 NOTE — Telephone Encounter (Signed)
Noted. Agree.

## 2015-01-20 NOTE — Telephone Encounter (Signed)
Spoke with patient's sister, [name] and she is going to find out if short acting or long acting and call me back. Will await return call.

## 2015-01-21 ENCOUNTER — Telehealth: Payer: Self-pay | Admitting: Family Medicine

## 2015-01-21 NOTE — Telephone Encounter (Signed)
Message left for to return my call.

## 2015-01-21 NOTE — Telephone Encounter (Signed)
Nurse from [facility]  Needs verification on how much depakote and keppra he is supposed to be taking  cb num [phone]

## 2015-01-25 ENCOUNTER — Encounter: Payer: Self-pay | Admitting: Family Medicine

## 2015-01-26 ENCOUNTER — Ambulatory Visit (INDEPENDENT_AMBULATORY_CARE_PROVIDER_SITE_OTHER): Payer: Medicare Other | Admitting: Neurology

## 2015-01-26 ENCOUNTER — Telehealth: Payer: Self-pay

## 2015-01-26 ENCOUNTER — Encounter: Payer: Self-pay | Admitting: Neurology

## 2015-01-26 VITALS — BP 106/66 | HR 60 | Resp 16 | Ht 71.0 in | Wt 118.0 lb

## 2015-01-26 DIAGNOSIS — Z720 Tobacco use: Secondary | ICD-10-CM | POA: Diagnosis not present

## 2015-01-26 DIAGNOSIS — F32A Depression, unspecified: Secondary | ICD-10-CM

## 2015-01-26 DIAGNOSIS — F418 Other specified anxiety disorders: Secondary | ICD-10-CM

## 2015-01-26 DIAGNOSIS — F419 Anxiety disorder, unspecified: Secondary | ICD-10-CM

## 2015-01-26 DIAGNOSIS — R4189 Other symptoms and signs involving cognitive functions and awareness: Secondary | ICD-10-CM

## 2015-01-26 DIAGNOSIS — G40909 Epilepsy, unspecified, not intractable, without status epilepticus: Secondary | ICD-10-CM

## 2015-01-26 DIAGNOSIS — F329 Major depressive disorder, single episode, unspecified: Secondary | ICD-10-CM

## 2015-01-26 DIAGNOSIS — G894 Chronic pain syndrome: Secondary | ICD-10-CM

## 2015-01-26 NOTE — Telephone Encounter (Signed)
Ronald [name] with [facility] left v/m requesting verbal order for home health for medication management and education 1 x a week for 1 week and 2 x a week for 5 weeks.Please advise.

## 2015-01-26 NOTE — Patient Instructions (Signed)
1.  I think you need 24 hour supervision.  You need somebody to physically watch you take your correct medications.   2.  Continue Depakote ER  twice daily. 3.  Take Keppra  twice daily.  I will prescribe you  pills, so take 2 pills twice daily. 4.  Contact Dr. Timoteo Expose office immediately to discuss other medications 5.  Recommend seeing a psychiatrist. 6.  Check CBC with diff, CMP, Keppra level and Depakote level in 3 months prior to follow up 7.  Follow up in 3 months.

## 2015-01-26 NOTE — Telephone Encounter (Signed)
Nicole advised. 

## 2015-01-26 NOTE — Telephone Encounter (Signed)
Ok to give verbal for this. Thanks. 
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
## 2015-01-26 NOTE — Telephone Encounter (Signed)
plz give verbal ok for this. 

## 2015-01-26 NOTE — Telephone Encounter (Signed)
Left voice mail for a call back

## 2015-01-26 NOTE — Telephone Encounter (Signed)
Left detailed message on voicemail.  

## 2015-01-26 NOTE — Telephone Encounter (Signed)
Ruffin Frederick arie Trammell social worker with [facility] left v/m requesting verbal order for home health social worker visits x 2. Ann spoke with Dr Moises Blood office and pt is willing to go to facility for placement due to concern with home situation.

## 2015-01-27 ENCOUNTER — Ambulatory Visit (INDEPENDENT_AMBULATORY_CARE_PROVIDER_SITE_OTHER): Payer: Medicare Other | Admitting: Family Medicine

## 2015-01-27 ENCOUNTER — Encounter: Payer: Self-pay | Admitting: Family Medicine

## 2015-01-27 ENCOUNTER — Telehealth: Payer: Self-pay | Admitting: Family Medicine

## 2015-01-27 VITALS — BP 110/64 | HR 76 | Temp 97.6°F | Wt 119.5 lb

## 2015-01-27 DIAGNOSIS — F333 Major depressive disorder, recurrent, severe with psychotic symptoms: Secondary | ICD-10-CM | POA: Diagnosis not present

## 2015-01-27 DIAGNOSIS — E43 Unspecified severe protein-calorie malnutrition: Secondary | ICD-10-CM

## 2015-01-27 DIAGNOSIS — F411 Generalized anxiety disorder: Secondary | ICD-10-CM | POA: Insufficient documentation

## 2015-01-27 DIAGNOSIS — G40909 Epilepsy, unspecified, not intractable, without status epilepticus: Secondary | ICD-10-CM

## 2015-01-27 DIAGNOSIS — Z720 Tobacco use: Secondary | ICD-10-CM

## 2015-01-27 DIAGNOSIS — G8929 Other chronic pain: Secondary | ICD-10-CM

## 2015-01-27 DIAGNOSIS — F1320 Sedative, hypnotic or anxiolytic dependence, uncomplicated: Secondary | ICD-10-CM

## 2015-01-27 DIAGNOSIS — R627 Adult failure to thrive: Secondary | ICD-10-CM

## 2015-01-27 DIAGNOSIS — G4089 Other seizures: Secondary | ICD-10-CM | POA: Diagnosis not present

## 2015-01-27 DIAGNOSIS — M549 Dorsalgia, unspecified: Secondary | ICD-10-CM

## 2015-01-27 DIAGNOSIS — G250 Essential tremor: Secondary | ICD-10-CM

## 2015-01-27 LAB — BASIC METABOLIC PANEL
BUN: 8 mg/dL (ref 6–23)
CALCIUM: 9.6 mg/dL (ref 8.4–10.5)
CHLORIDE: 84 meq/L — AB (ref 96–112)
CO2: 34 mEq/L — ABNORMAL HIGH (ref 19–32)
CREATININE: 0.59 mg/dL (ref 0.40–1.50)
GFR: 150.18 mL/min (ref >60.00)
Glucose, Bld: 116 mg/dL — ABNORMAL HIGH (ref 70–99)
Potassium: 4.8 mEq/L (ref 3.5–5.1)
Sodium: 124 mEq/L — ABNORMAL LOW (ref 135–145)

## 2015-01-27 MED ORDER — CYANOCOBALAMIN 1000 MCG PO TABS: 1000.0000 ug | ORAL_TABLET | Freq: Every day | ORAL | Status: DC

## 2015-01-27 MED ORDER — MIRTAZAPINE 7.5 MG PO TABS: 7.5000 mg | ORAL_TABLET | Freq: Every day | ORAL | Status: DC

## 2015-01-27 MED ORDER — OXYCODONE-ACETAMINOPHEN 5-325 MG PO TABS: 1.0000 | ORAL_TABLET | Freq: Three times a day (TID) | ORAL | Status: DC | PRN

## 2015-01-27 MED ORDER — DIVALPROEX SODIUM ER 500 MG PO TB24: 500.0000 mg | ORAL_TABLET | Freq: Two times a day (BID) | ORAL | Status: DC

## 2015-01-27 MED ORDER — LEVETIRACETAM 750 MG PO TABS: 1500.0000 mg | ORAL_TABLET | Freq: Two times a day (BID) | ORAL | Status: DC

## 2015-01-27 MED ORDER — CITALOPRAM HYDROBROMIDE 40 MG PO TABS: 40.0000 mg | ORAL_TABLET | Freq: Every day | ORAL | Status: DC

## 2015-01-27 NOTE — Assessment & Plan Note (Signed)
Deteriorated. Pt thinks he's been taking celexa/remeron - advised ensure he's compliant with meds. Given new psychotic features (hallucinations), did recommend eval by psych. Referral placed today.

## 2015-01-27 NOTE — Progress Notes (Signed)
Pre visit review using our clinic review tool, if applicable. No additional management support is needed unless otherwise documented below in the visit note. 

## 2015-01-27 NOTE — Telephone Encounter (Signed)
In your IN box for completion.  

## 2015-01-27 NOTE — Assessment & Plan Note (Signed)
Weight loss again noted. Ran out of ensure. Advised buy more today. He seems to be doing worse since last visit - conditions were better managed at nursing home.

## 2015-01-27 NOTE — Telephone Encounter (Signed)
Gar Gibbon with [facility] dropped off FL2 that needs to be completed and returned.  Please call [phone] when ready to be picked up. Placing on Kim's desk.

## 2015-01-27 NOTE — Assessment & Plan Note (Signed)
Continue to encourage cessation. Precontemplative. Off nicotine aptch

## 2015-01-27 NOTE — Progress Notes (Signed)
BP 110/64 mmHg  Pulse 76  Temp(Src) 97.6 F (36.4 C) (Oral)  Wt 119 lb 8 oz (54.205 kg)   CC: f/u visit  Subjective:    Patient ID: Ronald Marcus Sr., male    DOB: 1956-03-09, 59 y.o.   MRN: [id number]  HPI: KAIVEN VESTER Sr. is a 59 y.o. male presenting on 01/27/2015 for Follow-up and Labwork   Presents with niece.   Saw Dr Everlena Cooper yesterday, rec ALF. Reviewed his note from yesterday - son has moved out. Still having trouble with med administration and med confusion. Needs to have supervision to take meds. Pt considering move to ALF. HHSW coming out to house to help him navigate transition to higher level of care.  Anxiety and tremors worsening off valium. New auditory and visual hallucinations over last 2 weeks. Has psych nurse coming out to house next week. Denies SI/HI. Endorses worsening depression and anxiety.   When he established with me he told me he was taking percocets 10/325 per prior PCP. I reviewed Dr Fredirick Maudlin records and he was only taking 5/325mg  but he was receiving #120 tab monthly.   Smoking increased to 1 ppd.  Ran out of ensure - trouble affording.  Relevant past medical, surgical, family and social history reviewed and updated as indicated. Interim medical history since our last visit reviewed. Allergies and medications reviewed and updated. Current Outpatient Prescriptions on File Prior to Visit  Medication Sig  . acetaminophen (TYLENOL) 500 MG tablet Take 500 mg by mouth every 6 (six) hours as needed for moderate pain.   Marland Kitchen aspirin EC 81 MG tablet Take 1 tablet (81 mg total) by mouth daily. (Patient taking differently: Take 81 mg by mouth daily. For a-fib)  . feeding supplement, ENSURE COMPLETE, (ENSURE COMPLETE) LIQD Take 237 mLs by mouth 3 (three) times daily between meals. (Patient taking differently: Take 237 mLs by mouth 2 (two) times daily. )  . nicotine (NICODERM CQ - DOSED IN MG/24 HOURS) 14 mg/24hr patch Place 14 mg onto the skin daily.   No current  facility-administered medications on file prior to visit.    Review of Systems Per HPI unless specifically indicated above     Objective:    BP 110/64 mmHg  Pulse 76  Temp(Src) 97.6 F (36.4 C) (Oral)  Wt 119 lb 8 oz (54.205 kg)  Wt Readings from Last 3 Encounters:  01/27/15 119 lb 8 oz (54.205 kg)  01/26/15 118 lb (53.524 kg)  01/14/15 125 lb (56.7 kg)    Physical Exam  Constitutional: He appears well-developed and well-nourished. No distress.  HENT:  Mouth/Throat: Oropharynx is clear and moist. No oropharyngeal exudate.  Cardiovascular: Normal rate, regular rhythm, normal heart sounds and intact distal pulses.   No murmur heard. Pulmonary/Chest: Effort normal and breath sounds normal. No respiratory distress. He has no wheezes. He has no rales.  Coarse throughout  Psychiatric: His speech is normal and behavior is normal. Thought content normal. His mood appears anxious.  nervous  Nursing note and vitals reviewed.  Results for orders placed or performed during the hospital encounter of 01/17/15  I-Stat Chem 8, ED  Result Value Ref Range   Sodium 130 (L) 135 - 145 mmol/L   Potassium 4.1 3.5 - 5.1 mmol/L   Chloride 95 (L) 101 - 111 mmol/L   BUN 17 6 - 20 mg/dL   Creatinine, Ser 4.09 0.61 - 1.24 mg/dL   Glucose, Bld 811 (H) 65 - 99 mg/dL   Calcium,  Ion 1.09 (L) 1.12 - 1.23 mmol/L   TCO2 31 0 - 100 mmol/L   Hemoglobin 13.9 13.0 - 17.0 g/dL   HCT 16.1 09.6 - 04.5 %  I-Stat Troponin, ED (not at [facility])  Result Value Ref Range   Troponin i, poc 0.01 0.00 - 0.08 ng/mL   Comment 3              Assessment & Plan:   Problem List Items Addressed This Visit    Essential tremor    Consider non-benzo like primidone. Off valium, rec not restart benzo given history.      Tobacco abuse    Continue to encourage cessation. Precontemplative. Off nicotine aptch      Chronic back pain    Discussed with patient - he was previously prescribed 5/325 but on establishing care with me  told me he was taking 10/325.  Will decrease dose to 5/325mg  and recommend continue to taper off chronic narcotic - will change from QID PRN to TID PRN. Pt expresses understanding. Pt states pain under adequate control at this time.      Relevant Medications   oxyCODONE-acetaminophen (ROXICET) 5-325 MG per tablet   MDD (major depressive disorder) - Primary    Deteriorated. Pt thinks he's been taking celexa/remeron - advised ensure he's compliant with meds. Given new psychotic features (hallucinations), did recommend eval by psych. Referral placed today.      Relevant Medications   citalopram (CELEXA) 40 MG tablet   mirtazapine (REMERON) 7.5 MG tablet   Other Relevant Orders   Ambulatory referral to Psychiatry   FTT (failure to thrive) in adult    Encouraged investigate local ALFs      Recurrent seizures    Pt remains confused about medications.  Reinforced keppra and depakote ER doses, refilled to pharmacy. Emphasized importance of taking meds correctly and how he needs supervisor for med administration. Did encourage he investigate local ALFs. Check BMP, depakote and keppra levels.      Relevant Medications   levETIRAcetam (KEPPRA) 750 MG tablet   divalproex (DEPAKOTE ER) 500 MG 24 hr tablet   Other Relevant Orders   Basic metabolic panel   Valproic acid level   Levetiracetam level   Protein-calorie malnutrition, severe    Weight loss again noted. Ran out of ensure. Advised buy more today. He seems to be doing worse since last visit - conditions were better managed at nursing home.      Benzodiazepine dependence, continuous    Now off all benzos, recommend he not restart them.      Relevant Orders   Ambulatory referral to Psychiatry   GAD (generalized anxiety disorder)    Discussed first step is to ensure pt taking meds appropriately then will titrate meds as needed.      Relevant Orders   Ambulatory referral to Psychiatry       Follow up plan: Return in about  2 weeks (around 02/10/2015), or as needed, for follow up visit.

## 2015-01-27 NOTE — Addendum Note (Signed)
Addended by: Eustaquio Boyden on: 01/27/2015 10:06 AM   Modules accepted: Level of Service

## 2015-01-27 NOTE — Patient Instructions (Addendum)
We have changed percocets from 10/325 to 5/325 - lower dose. Only take as needed. Start with tylenol .  Look below for new medicine regimen. Restart ensure. Try to cut back on smoking. Make sure you're taking medicines as below on med list (make sure you're taking celexa and remeron (mirtazapine) daily). Pass by Marion's office for referral to psychiatry.

## 2015-01-27 NOTE — Assessment & Plan Note (Signed)
Discussed with patient - he was previously prescribed 5/325 but on establishing care with me told me he was taking 10/325.  Will decrease dose to 5/325mg  and recommend continue to taper off chronic narcotic - will change from QID PRN to TID PRN. Pt expresses understanding. Pt states pain under adequate control at this time.

## 2015-01-27 NOTE — Assessment & Plan Note (Signed)
Now off all benzos, recommend he not restart them.

## 2015-01-27 NOTE — Assessment & Plan Note (Signed)
Consider non-benzo like primidone. Off valium, rec not restart benzo given history.

## 2015-01-27 NOTE — Assessment & Plan Note (Addendum)
Pt remains confused about medications.  Reinforced keppra and depakote ER doses, refilled to pharmacy. Emphasized importance of taking meds correctly and how he needs supervisor for med administration. Did encourage he investigate local ALFs. Check BMP, depakote and keppra levels.

## 2015-01-27 NOTE — Telephone Encounter (Signed)
Spoke with Misty Stanley to confirm what patient was taking. She confirmed that he was taking  of Depakote BID and  of Keppra BID.

## 2015-01-27 NOTE — Assessment & Plan Note (Signed)
Encouraged investigate local ALFs

## 2015-01-27 NOTE — Assessment & Plan Note (Addendum)
Discussed first step is to ensure pt taking meds appropriately then will titrate meds as needed.

## 2015-01-28 LAB — VALPROIC ACID LEVEL: VALPROIC ACID LVL: 96 ug/mL (ref 50.0–100.0)

## 2015-01-28 NOTE — Telephone Encounter (Signed)
Attempted to call Gar Gibbon but was unable to get an answer. Left a vm notifying her that patient's FL2 was ready to be picked up at her earliest convenience.

## 2015-01-28 NOTE — Telephone Encounter (Signed)
Filled and in Kim's box. 

## 2015-01-30 ENCOUNTER — Other Ambulatory Visit: Payer: Self-pay | Admitting: Family Medicine

## 2015-01-30 DIAGNOSIS — E871 Hypo-osmolality and hyponatremia: Secondary | ICD-10-CM

## 2015-01-30 LAB — LEVETIRACETAM LEVEL: KEPPRA (LEVETIRACETAM): 47.9 ug/mL

## 2015-01-30 MED ORDER — CITALOPRAM HYDROBROMIDE 20 MG PO TABS: 20.0000 mg | ORAL_TABLET | Freq: Every day | ORAL | Status: DC

## 2015-02-01 ENCOUNTER — Telehealth: Payer: Self-pay | Admitting: Family Medicine

## 2015-02-01 NOTE — Telephone Encounter (Signed)
Called Melissa back and advised her that patient needs to sign a DPR form in order for information to be given to her. Melissa stated that she will have her aunt to call back to get information since she is on the designated party release.

## 2015-02-01 NOTE — Telephone Encounter (Signed)
Melissa returned Regina's call about patient's lab results.

## 2015-02-03 ENCOUNTER — Other Ambulatory Visit: Payer: Self-pay

## 2015-02-10 ENCOUNTER — Telehealth: Payer: Self-pay | Admitting: Family Medicine

## 2015-02-10 NOTE — Telephone Encounter (Signed)
Patient's sister called to cancel appointment.  Patient told her niece that he doesn't want to come to the appointment and he's going to another doctor.  Patient's sister said she'll call back when she knows what he wants to do.  She said he's been making unwise decisions.

## 2015-02-11 ENCOUNTER — Encounter: Payer: Self-pay | Admitting: *Deleted

## 2015-02-11 ENCOUNTER — Emergency Department
Admission: EM | Admit: 2015-02-11 | Discharge: 2015-02-11 | Disposition: A | Discharge status: Home / Self Care | Payer: Medicare Other | Attending: Emergency Medicine | Admitting: Emergency Medicine

## 2015-02-11 DIAGNOSIS — R569 Unspecified convulsions: Secondary | ICD-10-CM | POA: Insufficient documentation

## 2015-02-11 DIAGNOSIS — F419 Anxiety disorder, unspecified: Secondary | ICD-10-CM | POA: Diagnosis not present

## 2015-02-11 DIAGNOSIS — Z760 Encounter for issue of repeat prescription: Secondary | ICD-10-CM | POA: Insufficient documentation

## 2015-02-11 DIAGNOSIS — Z720 Tobacco use: Secondary | ICD-10-CM | POA: Diagnosis not present

## 2015-02-11 DIAGNOSIS — R64 Cachexia: Secondary | ICD-10-CM | POA: Insufficient documentation

## 2015-02-11 LAB — VALPROIC ACID LEVEL: Valproic Acid Lvl: 62 ug/mL (ref 50.0–100.0)

## 2015-02-11 MED ORDER — DIVALPROEX SODIUM 250 MG PO DR TAB: 250.0000 mg | DELAYED_RELEASE_TABLET | Freq: Two times a day (BID) | ORAL | Status: DC

## 2015-02-11 MED ORDER — DIAZEPAM 5 MG PO TABS
10.0000 mg | ORAL_TABLET | Freq: Once | ORAL | Status: AC
Start: 2015-02-11 — End: 2015-02-11
  Administered 2015-02-11: 10 mg via ORAL
  Filled 2015-02-11: qty 2

## 2015-02-11 MED ORDER — LEVETIRACETAM 750 MG PO TABS: 1500.0000 mg | ORAL_TABLET | Freq: Two times a day (BID) | ORAL | Status: DC

## 2015-02-11 NOTE — ED Notes (Signed)
Pt arrives via [facility] from home. Ambulatory to triage room with a cane. Pt states that he had two seizures last night and one this morning. Pt states that he took his last Depakote and Keppra pill this morning and he does not have the money for three weeks to get it filled. Pt also concerned that he is out of his xanax and percocet tablet since yesterday.

## 2015-02-11 NOTE — ED Notes (Signed)
Called to room that pt was having a seizure. Upon entering room pt had tremble to right hand only and was moaning.  Pt holding eyes closed.  This RN placed BP cuff and pulse ox.  Pt stops trembling and starts to ask "what happened, where am I".  RN informed pt that he rang call bell and stated he was having a seizure.  Pt states "Well, I guess I did".  VSS, pt not post-ictal at this time, no loss of bowel or bladder.

## 2015-02-11 NOTE — Discharge Instructions (Signed)
Epilepsy °Epilepsy is a disorder in which a person has repeated seizures over time. A seizure is a release of abnormal electrical activity in the brain. Seizures can cause a change in attention, behavior, or the ability to remain awake and alert (altered mental status). Seizures often involve uncontrollable shaking (convulsions).  °Most people with epilepsy lead normal lives. However, people with epilepsy are at an increased risk of falls, accidents, and injuries. Therefore, it is important to begin treatment right away. °CAUSES  °Epilepsy has many possible causes. Anything that disturbs the normal pattern of brain cell activity can lead to seizures. This may include:  °· Head injury. °· Birth trauma. °· High fever as a child. °· Stroke. °· Bleeding into or around the brain. °· Certain drugs. °· Prolonged low oxygen, such as what occurs after CPR efforts. °· Abnormal brain development. °· Certain illnesses, such as meningitis, encephalitis (brain infection), malaria, and other infections. °· An imbalance of nerve signaling chemicals (neurotransmitters).   °SIGNS AND SYMPTOMS  °The symptoms of a seizure can vary greatly from one person to another. Right before a seizure, you may have a warning (aura) that a seizure is about to occur. An aura may include the following symptoms: °· Fear or anxiety. °· Nausea. °· Feeling like the room is spinning (vertigo). °· Vision changes, such as seeing flashing lights or spots. °Common symptoms during a seizure include: °· Abnormal sensations, such as an abnormal smell or a bitter taste in the mouth.   °· Sudden, general body stiffness.   °· Convulsions that involve rhythmic jerking of the face, arm, or leg on one or both sides.   °· Sudden change in consciousness.   °¨ Appearing to be awake but not responding.   °¨ Appearing to be asleep but cannot be awakened.   °· Grimacing, chewing, lip smacking, drooling, tongue biting, or loss of bowel or bladder control. °After a seizure,  you may feel sleepy for a while.  °DIAGNOSIS  °Your health care provider will ask about your symptoms and take a medical history. Descriptions from any witnesses to your seizures will be very helpful in the diagnosis. A physical exam, including a detailed neurological exam, is necessary. Various tests may be done, such as:  °· An electroencephalogram (EEG). This is a painless test of your brain waves. In this test, a diagram is created of your brain waves. These diagrams can be interpreted by a specialist. °· An MRI of the brain.   °· A CT scan of the brain.   °· A spinal tap (lumbar puncture, LP). °· Blood tests to check for signs of infection or abnormal blood chemistry. °TREATMENT  °There is no cure for epilepsy, but it is generally treatable. Once epilepsy is diagnosed, it is important to begin treatment as soon as possible. For most people with epilepsy, seizures can be controlled with medicines. The following may also be used: °· A pacemaker for the brain (vagus nerve stimulator) can be used for people with seizures that are not well controlled by medicine. °· Surgery on the brain. °For some people, epilepsy eventually goes away. °HOME CARE INSTRUCTIONS  °· Follow your health care provider's recommendations on driving and safety in normal activities. °· Get enough rest. Lack of sleep can cause seizures. °· Only take over-the-counter or prescription medicines as directed by your health care provider. Take any prescribed medicine exactly as directed. °· Avoid any known triggers of your seizures. °· Keep a seizure diary. Record what you recall about any seizure, especially any possible trigger.   °· Make   sure the people you live and work with know that you are prone to seizures. They should receive instructions on how to help you. In general, a witness to a seizure should:   °¨ Cushion your head and body.   °¨ Turn you on your side.   °¨ Avoid unnecessarily restraining you.   °¨ Not place anything inside your  mouth.   °¨ Call for emergency medical help if there is any question about what has occurred.   °· Follow up with your health care provider as directed. You may need regular blood tests to monitor the levels of your medicine.   °SEEK MEDICAL CARE IF:  °· You develop signs of infection or other illness. This might increase the risk of a seizure.   °· You seem to be having more frequent seizures.   °· Your seizure pattern is changing.   °SEEK IMMEDIATE MEDICAL CARE IF:  °· You have a seizure that does not stop after a few moments.   °· You have a seizure that causes any difficulty in breathing.   °· You have a seizure that results in a very severe headache.   °· You have a seizure that leaves you with the inability to speak or use a part of your body.   °Document Released: 05/29/2005 Document Revised: 03/19/2013 Document Reviewed: 01/08/2013 °[employer]® Patient Information ©2015 [employer]. This information is not intended to replace advice given to you by your health care provider. Make sure you discuss any questions you have with your health care provider. ° °Medication Refill, Emergency Department °We have refilled your medication today as a courtesy to you. It is best for your medical care, however, to take care of getting refills done through your primary caregiver's office. They have your records and can do a better job of follow-up than we can in the emergency department. °On maintenance medications, we often only prescribe enough medications to get you by until you are able to see your regular caregiver. This is a more expensive way to refill medications. °In the future, please plan for refills so that you will not have to use the emergency department for this. °Thank you for your help. Your help allows us to better take care of the daily emergencies that enter our department. °Document Released: 09/15/2003 Document Revised: 08/21/2011 Document Reviewed: 09/05/2013 °[employer]® Patient Information ©2015  [employer]. This information is not intended to replace advice given to you by your health care provider. Make sure you discuss any questions you have with your health care provider. ° °
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
## 2015-02-12 ENCOUNTER — Ambulatory Visit: Payer: Self-pay | Admitting: Family Medicine

## 2015-02-12 NOTE — Telephone Encounter (Signed)
Noted  

## 2015-02-12 NOTE — Telephone Encounter (Signed)
Contacted sister to get he to have pt contact us regarding psychiatric referral.   Patient is going back to Dr. Clarene Duke and no longer wants to receive care here.  Stated that they appreciated the care that pt had gotten here, but he was not making good choices and was going back to his "old ways" and only wanted prescription meds from Dr. Clarene Duke.

## 2015-02-13 ENCOUNTER — Inpatient Hospital Stay (HOSPITAL_COMMUNITY)
Admission: EM | Admit: 2015-02-13 | Discharge: 2015-02-14 | DRG: 101 | Disposition: A | Discharge status: Home / Self Care | Payer: Medicare Other | Attending: Internal Medicine | Admitting: Internal Medicine

## 2015-02-13 ENCOUNTER — Emergency Department (HOSPITAL_COMMUNITY): Payer: Medicare Other

## 2015-02-13 ENCOUNTER — Encounter (HOSPITAL_COMMUNITY): Payer: Self-pay

## 2015-02-13 DIAGNOSIS — J449 Chronic obstructive pulmonary disease, unspecified: Secondary | ICD-10-CM | POA: Diagnosis present

## 2015-02-13 DIAGNOSIS — G40909 Epilepsy, unspecified, not intractable, without status epilepticus: Secondary | ICD-10-CM | POA: Diagnosis not present

## 2015-02-13 DIAGNOSIS — F419 Anxiety disorder, unspecified: Secondary | ICD-10-CM | POA: Diagnosis not present

## 2015-02-13 DIAGNOSIS — F329 Major depressive disorder, single episode, unspecified: Secondary | ICD-10-CM | POA: Diagnosis present

## 2015-02-13 DIAGNOSIS — M549 Dorsalgia, unspecified: Secondary | ICD-10-CM

## 2015-02-13 DIAGNOSIS — F17210 Nicotine dependence, cigarettes, uncomplicated: Secondary | ICD-10-CM | POA: Diagnosis present

## 2015-02-13 DIAGNOSIS — R569 Unspecified convulsions: Secondary | ICD-10-CM | POA: Diagnosis present

## 2015-02-13 DIAGNOSIS — Z9114 Patient's other noncompliance with medication regimen: Secondary | ICD-10-CM | POA: Diagnosis not present

## 2015-02-13 DIAGNOSIS — G8929 Other chronic pain: Secondary | ICD-10-CM | POA: Diagnosis present

## 2015-02-13 DIAGNOSIS — Z981 Arthrodesis status: Secondary | ICD-10-CM

## 2015-02-13 DIAGNOSIS — Z79899 Other long term (current) drug therapy: Secondary | ICD-10-CM | POA: Diagnosis not present

## 2015-02-13 DIAGNOSIS — M545 Low back pain: Secondary | ICD-10-CM | POA: Diagnosis present

## 2015-02-13 DIAGNOSIS — G250 Essential tremor: Secondary | ICD-10-CM | POA: Diagnosis present

## 2015-02-13 DIAGNOSIS — Z7982 Long term (current) use of aspirin: Secondary | ICD-10-CM

## 2015-02-13 LAB — CBC WITH DIFFERENTIAL/PLATELET
BASOS ABS: 0 10*3/uL (ref 0.0–0.1)
BASOS PCT: 0 % (ref 0–1)
EOS ABS: 0.2 10*3/uL (ref 0.0–0.7)
Eosinophils Relative: 3 % (ref 0–5)
HCT: 39.6 % (ref 39.0–52.0)
HEMOGLOBIN: 13.2 g/dL (ref 13.0–17.0)
Lymphocytes Relative: 30 % (ref 12–46)
Lymphs Abs: 2 10*3/uL (ref 0.7–4.0)
MCH: 33.6 pg (ref 26.0–34.0)
MCHC: 33.3 g/dL (ref 30.0–36.0)
MCV: 100.8 fL — ABNORMAL HIGH (ref 78.0–100.0)
MONO ABS: 0.4 10*3/uL (ref 0.1–1.0)
MONOS PCT: 6 % (ref 3–12)
NEUTROS PCT: 61 % (ref 43–77)
Neutro Abs: 4.1 10*3/uL (ref 1.7–7.7)
Platelets: 184 10*3/uL (ref 150–400)
RBC: 3.93 MIL/uL — ABNORMAL LOW (ref 4.22–5.81)
RDW: 14.2 % (ref 11.5–15.5)
WBC: 6.7 10*3/uL (ref 4.0–10.5)

## 2015-02-13 LAB — COMPREHENSIVE METABOLIC PANEL
ALBUMIN: 3.7 g/dL (ref 3.5–5.0)
ALT: 12 U/L — ABNORMAL LOW (ref 17–63)
ANION GAP: 7 (ref 5–15)
AST: 19 U/L (ref 15–41)
Alkaline Phosphatase: 48 U/L (ref 38–126)
BUN: 19 mg/dL (ref 6–20)
CO2: 32 mmol/L (ref 22–32)
Calcium: 9 mg/dL (ref 8.9–10.3)
Chloride: 96 mmol/L — ABNORMAL LOW (ref 101–111)
Creatinine, Ser: 0.62 mg/dL (ref 0.61–1.24)
GFR calc Af Amer: >60 mL/min (ref >60)
GFR calc non Af Amer: >60 mL/min (ref >60)
GLUCOSE: 86 mg/dL (ref 65–99)
POTASSIUM: 4.3 mmol/L (ref 3.5–5.1)
SODIUM: 135 mmol/L (ref 135–145)
Total Bilirubin: 0.2 mg/dL — ABNORMAL LOW (ref 0.3–1.2)
Total Protein: 6.8 g/dL (ref 6.5–8.1)

## 2015-02-13 LAB — MAGNESIUM: MAGNESIUM: 2 mg/dL (ref 1.7–2.4)

## 2015-02-13 LAB — VALPROIC ACID LEVEL: Valproic Acid Lvl: 36 ug/mL — ABNORMAL LOW (ref 50.0–100.0)

## 2015-02-13 LAB — RAPID URINE DRUG SCREEN, HOSP PERFORMED
Amphetamines: NOT DETECTED
BARBITURATES: NOT DETECTED
BENZODIAZEPINES: POSITIVE — AB
COCAINE: NOT DETECTED
Opiates: NOT DETECTED
TETRAHYDROCANNABINOL: NOT DETECTED

## 2015-02-13 LAB — ETHANOL: Alcohol, Ethyl (B): <5 mg/dL (ref <5)

## 2015-02-13 MED ORDER — ACETAMINOPHEN 500 MG PO TABS: 500.0000 mg | ORAL_TABLET | Freq: Four times a day (QID) | ORAL | Status: DC | PRN

## 2015-02-13 MED ORDER — LORAZEPAM 2 MG/ML IJ SOLN
INTRAMUSCULAR | Status: AC
  Filled 2015-02-13: qty 1

## 2015-02-13 MED ORDER — LORAZEPAM 2 MG/ML IJ SOLN
1.0000 mg | Freq: Once | INTRAMUSCULAR | Status: AC
  Administered 2015-02-13: 1 mg via INTRAVENOUS

## 2015-02-13 MED ORDER — SODIUM CHLORIDE 0.9 % IJ SOLN: 3.0000 mL | Freq: Two times a day (BID) | INTRAMUSCULAR | Status: DC

## 2015-02-13 MED ORDER — NICOTINE 14 MG/24HR TD PT24
14.0000 mg | MEDICATED_PATCH | Freq: Every day | TRANSDERMAL | Status: DC
  Administered 2015-02-14: 14 mg via TRANSDERMAL
  Filled 2015-02-13 (×2): qty 1

## 2015-02-13 MED ORDER — LORAZEPAM 1 MG PO TABS: 1.0000 mg | ORAL_TABLET | Freq: Once | ORAL | Status: DC

## 2015-02-13 MED ORDER — SODIUM CHLORIDE 0.9 % IV SOLN
1000.0000 mg | INTRAVENOUS | Status: AC
  Administered 2015-02-13: 1000 mg via INTRAVENOUS
  Filled 2015-02-13: qty 10

## 2015-02-13 MED ORDER — ENOXAPARIN SODIUM 40 MG/0.4ML ~~LOC~~ SOLN
40.0000 mg | SUBCUTANEOUS | Status: DC
  Administered 2015-02-13: 40 mg via SUBCUTANEOUS
  Filled 2015-02-13 (×2): qty 0.4

## 2015-02-13 MED ORDER — ASPIRIN EC 81 MG PO TBEC
81.0000 mg | DELAYED_RELEASE_TABLET | Freq: Every day | ORAL | Status: DC
  Administered 2015-02-14: 81 mg via ORAL
  Filled 2015-02-13: qty 1

## 2015-02-13 MED ORDER — FOLIC ACID 1 MG PO TABS
1.0000 mg | ORAL_TABLET | Freq: Every day | ORAL | Status: DC
  Administered 2015-02-13 – 2015-02-14 (×2): 1 mg via ORAL
  Filled 2015-02-13 (×2): qty 1

## 2015-02-13 MED ORDER — ACETAMINOPHEN 650 MG RE SUPP: 650.0000 mg | Freq: Four times a day (QID) | RECTAL | Status: DC | PRN

## 2015-02-13 MED ORDER — DIVALPROEX SODIUM ER 500 MG PO TB24
750.0000 mg | ORAL_TABLET | Freq: Two times a day (BID) | ORAL | Status: DC
  Administered 2015-02-13 – 2015-02-14 (×2): 750 mg via ORAL
  Filled 2015-02-13 (×3): qty 1

## 2015-02-13 MED ORDER — ENSURE ENLIVE PO LIQD
237.0000 mL | Freq: Three times a day (TID) | ORAL | Status: DC
Start: 2015-02-13 — End: 2015-02-14
  Administered 2015-02-13 – 2015-02-14 (×3): 237 mL via ORAL
  Filled 2015-02-13 (×3): qty 237

## 2015-02-13 MED ORDER — ACETAMINOPHEN 325 MG PO TABS: 650.0000 mg | ORAL_TABLET | Freq: Four times a day (QID) | ORAL | Status: DC | PRN

## 2015-02-13 MED ORDER — SODIUM CHLORIDE 0.9 % IV SOLN: 250.0000 mL | INTRAVENOUS | Status: DC | PRN

## 2015-02-13 MED ORDER — LEVETIRACETAM 750 MG PO TABS
1500.0000 mg | ORAL_TABLET | Freq: Two times a day (BID) | ORAL | Status: DC
  Administered 2015-02-13 – 2015-02-14 (×2): 1500 mg via ORAL
  Filled 2015-02-13 (×3): qty 2

## 2015-02-13 MED ORDER — ALPRAZOLAM 0.25 MG PO TABS
0.2500 mg | ORAL_TABLET | Freq: Two times a day (BID) | ORAL | Status: DC | PRN
  Administered 2015-02-13 – 2015-02-14 (×2): 0.25 mg via ORAL
  Filled 2015-02-13 (×2): qty 1

## 2015-02-13 MED ORDER — ONDANSETRON HCL 4 MG PO TABS: 4.0000 mg | ORAL_TABLET | Freq: Four times a day (QID) | ORAL | Status: DC | PRN

## 2015-02-13 MED ORDER — CITALOPRAM HYDROBROMIDE 20 MG PO TABS
20.0000 mg | ORAL_TABLET | Freq: Every day | ORAL | Status: DC
  Administered 2015-02-13: 20 mg via ORAL
  Filled 2015-02-13 (×2): qty 1

## 2015-02-13 MED ORDER — VITAMIN B-12 1000 MCG PO TABS
1000.0000 ug | ORAL_TABLET | Freq: Every day | ORAL | Status: DC
Start: 2015-02-14 — End: 2015-02-14
  Administered 2015-02-14: 1000 ug via ORAL
  Filled 2015-02-13: qty 1

## 2015-02-13 MED ORDER — OXYCODONE-ACETAMINOPHEN 5-325 MG PO TABS
1.0000 | ORAL_TABLET | Freq: Three times a day (TID) | ORAL | Status: DC | PRN
  Administered 2015-02-14: 1 via ORAL
  Filled 2015-02-13: qty 1

## 2015-02-13 MED ORDER — VALPROATE SODIUM 500 MG/5ML IV SOLN
500.0000 mg | Freq: Once | INTRAVENOUS | Status: AC
  Administered 2015-02-13: 500 mg via INTRAVENOUS
  Filled 2015-02-13: qty 5

## 2015-02-13 MED ORDER — VITAMIN B-1 100 MG PO TABS
100.0000 mg | ORAL_TABLET | Freq: Every day | ORAL | Status: DC
  Administered 2015-02-13 – 2015-02-14 (×2): 100 mg via ORAL
  Filled 2015-02-13 (×2): qty 1

## 2015-02-13 MED ORDER — LORAZEPAM 2 MG/ML IJ SOLN: 1.0000 mg | INTRAMUSCULAR | Status: DC | PRN

## 2015-02-13 MED ORDER — ONDANSETRON HCL 4 MG/2ML IJ SOLN: 4.0000 mg | Freq: Four times a day (QID) | INTRAMUSCULAR | Status: DC | PRN

## 2015-02-13 MED ORDER — SODIUM CHLORIDE 0.9 % IJ SOLN: 3.0000 mL | INTRAMUSCULAR | Status: DC | PRN

## 2015-02-13 MED ORDER — SODIUM CHLORIDE 0.9 % IJ SOLN
3.0000 mL | Freq: Two times a day (BID) | INTRAMUSCULAR | Status: DC
  Administered 2015-02-13 – 2015-02-14 (×2): 3 mL via INTRAVENOUS
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
## 2015-02-13 NOTE — ED Notes (Signed)
Pt arrived via EMS c/o seizure, unwitnessed at home.  Pt had aura with flashing lights and everything went dark, woke up on the floor called EMS.  Pt takes Depakote and Keppra, last seizure was Thursday.
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
## 2015-02-13 NOTE — ED Notes (Signed)
This RN answered the call bell and pt sts he feels as if he is going to have a seizure.  This RN and Duwayne Heck, RN went in to room to see patient.  Pt convulsing, and not responding orally.  O2 saturations and HR remained stable.  MD came to bedside.  Pt became alert and states "where I am?  I see a light".  Pt re-oriented to his location and the reason for being here.  Pt immediately alert and oriented and requesting headache medicine.

## 2015-02-14 DIAGNOSIS — J449 Chronic obstructive pulmonary disease, unspecified: Secondary | ICD-10-CM | POA: Diagnosis not present

## 2015-02-14 DIAGNOSIS — R569 Unspecified convulsions: Secondary | ICD-10-CM | POA: Diagnosis not present

## 2015-02-14 DIAGNOSIS — G40909 Epilepsy, unspecified, not intractable, without status epilepticus: Secondary | ICD-10-CM | POA: Diagnosis not present

## 2015-02-14 LAB — BASIC METABOLIC PANEL
Anion gap: 6 (ref 5–15)
BUN: 16 mg/dL (ref 6–20)
CALCIUM: 8.8 mg/dL — AB (ref 8.9–10.3)
CHLORIDE: 95 mmol/L — AB (ref 101–111)
CO2: 34 mmol/L — ABNORMAL HIGH (ref 22–32)
CREATININE: 0.6 mg/dL — AB (ref 0.61–1.24)
GFR calc Af Amer: >60 mL/min (ref >60)
GFR calc non Af Amer: >60 mL/min (ref >60)
Glucose, Bld: 124 mg/dL — ABNORMAL HIGH (ref 65–99)
Potassium: 4.2 mmol/L (ref 3.5–5.1)
SODIUM: 135 mmol/L (ref 135–145)

## 2015-02-14 LAB — MRSA PCR SCREENING: MRSA BY PCR: NEGATIVE

## 2015-02-14 MED ORDER — DIVALPROEX SODIUM ER 500 MG PO TB24: 500.0000 mg | ORAL_TABLET | Freq: Two times a day (BID) | ORAL | Status: DC

## 2015-02-14 MED ORDER — LEVETIRACETAM 750 MG PO TABS: 1500.0000 mg | ORAL_TABLET | Freq: Two times a day (BID) | ORAL | Status: DC

## 2015-02-14 MED ORDER — OXYCODONE-ACETAMINOPHEN 5-325 MG PO TABS: 1.0000 | ORAL_TABLET | Freq: Three times a day (TID) | ORAL | Status: DC | PRN

## 2015-02-14 MED ORDER — ALPRAZOLAM 0.25 MG PO TABS: 0.2500 mg | ORAL_TABLET | Freq: Two times a day (BID) | ORAL | Status: DC | PRN

## 2015-02-14 NOTE — Progress Notes (Signed)
[facility] PROGRESS NOTE  GEREMY RISTER Sr. WUJ:[id number] DOB: 01-28-1956 DOA: 02/13/2015 PCP: Aida Puffer, MD  Assessment/Plan: 59 y/o male with PMH of COPD, Chronic Tobacco use, DJD, Seizures, Non adherence to medication regimen is admitted with Breakthrough seizures  1. Seizures. Breakthrough seizures. Patient reports not able to take his medication for several days. no new seizures inpatient. Neuro exam is non focal. CT head: no acute findings. Reemphasized medication compliance. We ill ask case management with medication  Assistance      2. COPD. No s/s of exacerbations. Chronic Tobacco use. Recommended to stop smoking   Code Status: full Family Communication: d/w patient, RN (indicate person spoken with, relationship, and if by phone, the number) Disposition Plan: home 24-48 hrs    Consultants:  none  Procedures:  none  Antibiotics:  None  (indicate start date, and stop date if known)  HPI/Subjective: Alert. No pains   Objective: Filed Vitals:   02/14/15 0730  BP: 87/62  Pulse: 64  Temp: 97.6 F (36.4 C)  Resp: 17    Intake/Output Summary (Last 24 hours) at 02/14/15 0817 Last data filed at 02/14/15 0300  Gross per 24 hour  Intake    120 ml  Output    400 ml  Net   -280 ml   Filed Weights   02/14/15 0600  Weight: 50.8 kg (111 lb 15.9 oz)    Exam:   General:  No distress   Cardiovascular: S1,S2. rrr  Respiratory: CTA BL  Abdomen: soft, nt,n dn  Musculoskeletal: no leg edema    Data Reviewed: Basic Metabolic Panel:  Recent Labs Lab 02/13/15 1616 02/13/15 2208 02/14/15 0358  NA 135  --  135  K 4.3  --  4.2  CL 96*  --  95*  CO2 32  --  34*  GLUCOSE 86  --  124*  BUN 19  --  16  CREATININE 0.62  --  0.60*  CALCIUM 9.0  --  8.8*  MG  --  2.0  --    Liver Function Tests:  Recent Labs Lab 02/13/15 1616  AST 19  ALT 12*  ALKPHOS 48  BILITOT 0.2*  PROT 6.8  ALBUMIN 3.7   No results for input(s): LIPASE, AMYLASE in  the last 168 hours. No results for input(s): AMMONIA in the last 168 hours. CBC:  Recent Labs Lab 02/13/15 1616  WBC 6.7  NEUTROABS 4.1  HGB 13.2  HCT 39.6  MCV 100.8*  PLT 184   Cardiac Enzymes: No results for input(s): CKTOTAL, CKMB, CKMBINDEX, TROPONINI in the last 168 hours. BNP (last 3 results) No results for input(s): BNP in the last 8760 hours.  ProBNP (last 3 results) No results for input(s): PROBNP in the last 8760 hours.  CBG: No results for input(s): GLUCAP in the last 168 hours.  Recent Results (from the past 240 hour(s))  MRSA PCR Screening     Status: None   Collection Time: 02/14/15 12:53 AM  Result Value Ref Range Status   MRSA by PCR NEGATIVE NEGATIVE Final    Comment:        The GeneXpert MRSA Assay (FDA approved for NASAL specimens only), is one component of a comprehensive MRSA colonization surveillance program. It is not intended to diagnose MRSA infection nor to guide or monitor treatment for MRSA infections.      Studies: Ct Head Wo Contrast  02/13/2015   CLINICAL DATA:  Seizure.  History of seizures.  EXAM: CT HEAD WITHOUT CONTRAST  TECHNIQUE: Contiguous axial images were obtained from the base of the skull through the vertex without intravenous contrast.  COMPARISON:  Brain MR dated 12/10/2014 and head CT dated 12/07/2014.  FINDINGS: Stable mildly enlarged ventricles and subarachnoid spaces. Otherwise, normal appearing cerebral hemispheres and posterior fossa structures. Normal size and position of the ventricles. No intracranial hemorrhage, mass lesion or CT evidence of acute infarction. Unremarkable bones and included paranasal sinuses.  IMPRESSION: Stable mild diffuse cerebral and cerebellar atrophy. No acute abnormality.   Electronically Signed   By: Beckie Salts M.D.   On: 02/13/2015 16:55    Scheduled Meds: . aspirin EC  81 mg Oral Daily  . citalopram  20 mg Oral QHS  . divalproex  750 mg Oral BID  . enoxaparin (LOVENOX) injection  40  mg Subcutaneous Q24H  . feeding supplement (ENSURE ENLIVE)  237 mL Oral TID BM  . folic acid  1 mg Oral Daily  . levETIRAcetam  1,500 mg Oral BID  . nicotine  14 mg Transdermal Daily  . sodium chloride  3 mL Intravenous Q12H  . sodium chloride  3 mL Intravenous Q12H  . thiamine  100 mg Oral Daily  . cyanocobalamin  1,000 mcg Oral Daily   Continuous Infusions:   Active Problems:   Seizure    Time spent: >35 minutes     Esperanza Sheets  [facility] Pager [phone]. If 7PM-7AM, please contact night-coverage at [email or web address], password [facility] 02/14/2015, 8:17 AM  LOS: 1 day

## 2015-02-14 NOTE — Progress Notes (Signed)
Discharged pt home. IV d/c'd. Tip intact. Discharge instructions given. No questions or concerns at this time.

## 2015-02-14 NOTE — Progress Notes (Signed)
Spoke with pts daughter who says pt is an addict and wants him to go to rehab. Asked pt if he would be willing to talk to a social worker about a rehab and he said "no". MD aware. Attempted to call daughter to let her know we were going to go ahead with the discharge bc pt refused a rehab but received no answer and could not leave a message bc the mailbox was full.

## 2015-02-14 NOTE — Discharge Summary (Addendum)
Physician Discharge Summary  Ronald MANUS Sr. ZOX:[id number] DOB: 07-20-1955 DOA: 02/13/2015  PCP: Aida Puffer, MD  Admit date: 02/13/2015 Discharge date: 02/14/2015  Time spent: >35 minutes  Recommendations for Outpatient Follow-up:  F/u with PCP in 1 week F/u with neurology in 3-4 weeks   Discharge Diagnoses:  Active Problems:   Seizure   Discharge Condition: stable   Diet recommendation: regular   Filed Weights   02/14/15 0600  Weight: 50.8 kg (111 lb 15.9 oz)    History of present illness:  59 y/o male with PMH of COPD, Chronic Tobacco use, DJD, Seizures, Non adherence to medication regimen is admitted with Breakthrough seizures. He reports not taking his meds for several days  -"presented with acute onset of seizure today while he was looking at pictures. He had an aura followed by tonic-clonic seizures and he called EMS when he woke up. Patient does not remember what happened during the episode, but he had an episode of tonic-clonic seizures while in the emergency room with no loss of urine or stools. Patient denies any history of trauma to head. Patient was recently started on Xanax for anxiety by his physician and they were dumped while he was taking his pill during seizures. Patient reports that Xanax helped with his seizures. Patient reports a seizure on Thursday as well when he went to [facility] and was discharged from the ER. Patient reports non compliance with his medications and he buys them every 2 or 3 days due to the price"   Hospital Course:  1. Seizures. Breakthrough seizures. Patient reports not able to take his medication for several days. no new seizures inpatient. Neuro exam is non focal. CT head: no acute findings. His depakote level was low due to nto taking meds. Resume depakote at 500 BID, keppra at 1500 BID. Reemphasized medication compliance. Consulted case management with medication Assistance.  2. COPD. No s/s of exacerbations. Chronic Tobacco  use. Recommended to stop smoking   D/c plans possible today.  Addendum: per his family, patient is abusing prescription opioids. i have stopped his opioids, but we will cont low dose benzo to prevent withdrawals since patient reports taking them daily. He will require long term weaning. Also discuss the rehabilitation if patient is interested   Procedures:  none (i.e. Studies not automatically included, echos, thoracentesis, etc; not x-rays)  Consultations:  none  Discharge Exam: Filed Vitals:   02/14/15 0805  BP: 104/69  Pulse: 67  Temp:   Resp: 15    General: alert. No distress  Cardiovascular: s1,s2 rrr Respiratory: CTA BL  Discharge Instructions  Discharge Instructions    Diet - low sodium heart healthy    Complete by:  As directed      Discharge instructions    Complete by:  As directed   Please follow up with primary care doctor in 1 week     Increase activity slowly    Complete by:  As directed             Medication List    STOP taking these medications        oxyCODONE-acetaminophen 5-325 MG per tablet  Commonly known as:  ROXICET      TAKE these medications        acetaminophen 500 MG tablet  Commonly known as:  TYLENOL  Take 500 mg by mouth every 6 (six) hours as needed for moderate pain.     ALPRAZolam 0.25 MG tablet  Commonly known as:  Prudy Feeler  Take 1 tablet (0.25 mg total) by mouth 2 (two) times daily as needed for anxiety.     aspirin EC 81 MG tablet  Take 1 tablet (81 mg total) by mouth daily.     citalopram 20 MG tablet  Commonly known as:  CELEXA  Take 1 tablet (20 mg total) by mouth at bedtime.     cyanocobalamin 1000 MCG tablet  Take 1 tablet (1,000 mcg total) by mouth daily.     divalproex 500 MG 24 hr tablet  Commonly known as:  DEPAKOTE ER  Take 1 tablet (500 mg total) by mouth 2 (two) times daily.     feeding supplement (ENSURE COMPLETE) Liqd  Take 237 mLs by mouth 3 (three) times daily between meals.     levETIRAcetam  750 MG tablet  Commonly known as:  KEPPRA  Take 2 tablets (1,500 mg total) by mouth 2 (two) times daily.     mirtazapine 7.5 MG tablet  Commonly known as:  REMERON  Take 1 tablet (7.5 mg total) by mouth at bedtime.     nicotine 14 mg/24hr patch  Commonly known as:  NICODERM CQ - dosed in mg/24 hours  Place 14 mg onto the skin daily.       Allergies  Allergen Reactions  . Antihistamines, Diphenhydramine-Type Other (See Comments)    Speeds up heart rate.  . Codeine Nausea And Vomiting       Follow-up Information    Follow up with LITTLE,JAMES, MD In 1 week.   Specialty:  Family Medicine   Contact information:   [address] [place] [address] [phone]        The results of significant diagnostics from this hospitalization (including imaging, microbiology, ancillary and laboratory) are listed below for reference.    Significant Diagnostic Studies: Ct Head Wo Contrast  02/13/2015   CLINICAL DATA:  Seizure.  History of seizures.  EXAM: CT HEAD WITHOUT CONTRAST  TECHNIQUE: Contiguous axial images were obtained from the base of the skull through the vertex without intravenous contrast.  COMPARISON:  Brain MR dated 12/10/2014 and head CT dated 12/07/2014.  FINDINGS: Stable mildly enlarged ventricles and subarachnoid spaces. Otherwise, normal appearing cerebral hemispheres and posterior fossa structures. Normal size and position of the ventricles. No intracranial hemorrhage, mass lesion or CT evidence of acute infarction. Unremarkable bones and included paranasal sinuses.  IMPRESSION: Stable mild diffuse cerebral and cerebellar atrophy. No acute abnormality.   Electronically Signed   By: Beckie Salts M.D.   On: 02/13/2015 16:55    Microbiology: Recent Results (from the past 240 hour(s))  MRSA PCR Screening     Status: None   Collection Time: 02/14/15 12:53 AM  Result Value Ref Range Status   MRSA by PCR NEGATIVE NEGATIVE Final    Comment:        The GeneXpert MRSA Assay  (FDA approved for NASAL specimens only), is one component of a comprehensive MRSA colonization surveillance program. It is not intended to diagnose MRSA infection nor to guide or monitor treatment for MRSA infections.      Labs: Basic Metabolic Panel:  Recent Labs Lab 02/13/15 1616 02/13/15 2208 02/14/15 0358  NA 135  --  135  K 4.3  --  4.2  CL 96*  --  95*  CO2 32  --  34*  GLUCOSE 86  --  124*  BUN 19  --  16  CREATININE 0.62  --  0.60*  CALCIUM 9.0  --  8.8*  MG  --  2.0  --    Liver Function Tests:  Recent Labs Lab 02/13/15 1616  AST 19  ALT 12*  ALKPHOS 48  BILITOT 0.2*  PROT 6.8  ALBUMIN 3.7   No results for input(s): LIPASE, AMYLASE in the last 168 hours. No results for input(s): AMMONIA in the last 168 hours. CBC:  Recent Labs Lab 02/13/15 1616  WBC 6.7  NEUTROABS 4.1  HGB 13.2  HCT 39.6  MCV 100.8*  PLT 184   Cardiac Enzymes: No results for input(s): CKTOTAL, CKMB, CKMBINDEX, TROPONINI in the last 168 hours. BNP: BNP (last 3 results) No results for input(s): BNP in the last 8760 hours.  ProBNP (last 3 results) No results for input(s): PROBNP in the last 8760 hours.  CBG: No results for input(s): GLUCAP in the last 168 hours.     SignedEsperanza Sheets  [facility] 02/14/2015, 10:09 AM

## 2015-02-14 NOTE — Care Management Note (Signed)
Case Management Note  Patient Details  Name: Ronald RAMTHUN Sr. MRN: [id number] Date of Birth: 1956-03-17  Subjective/Objective:        seizures            Action/Plan: Home Health-NCM spoke to pt and he has Depakote and Keppra at home. Pt receives a [employer] month, but he has difficulty with paying for copay. Requesting medication assistance for Xanax and Roxicet. Explained no assistance program to help with copay for controlled substances. Contacted [facility] for resumption of care for [facility] RN/PT.   Expected Discharge Date:  02/15/15               Expected Discharge Plan:  Home w Home Health Services  In-House Referral:     Discharge planning Services  CM Consult  Post Acute Care Choice:  Home Health, Resumption of Svcs/PTA Provider Choice offered to:  Patient  DME Arranged:    DME Agency:     HH Arranged:  RN, PT HH Agency:  [facility]  Status of Service:  Completed, signed off  Medicare Important Message Given:    Date Medicare IM Given:    Medicare IM give by:    Date Additional Medicare IM Given:    Additional Medicare Important Message give by:     If discussed at Long Length of Stay Meetings, dates discussed:    Additional Comments:  Elliot Cousin, RN 02/14/2015, 3:48 PM

## 2015-02-14 NOTE — Progress Notes (Signed)
Paged Case Management about pt needing help with obtaining medications. They are on their way to see him.

## 2015-02-15 LAB — LEVETIRACETAM LEVEL: LEVETIRACETAM: 42.7 ug/mL — AB (ref 10.0–40.0)

## 2015-02-18 ENCOUNTER — Emergency Department (HOSPITAL_COMMUNITY)
Admission: EM | Admit: 2015-02-18 | Discharge: 2015-02-18 | Disposition: A | Discharge status: Home / Self Care | Payer: Medicare Other | Attending: Emergency Medicine | Admitting: Emergency Medicine

## 2015-02-18 ENCOUNTER — Encounter (HOSPITAL_COMMUNITY): Payer: Self-pay

## 2015-02-18 ENCOUNTER — Emergency Department (HOSPITAL_COMMUNITY): Payer: Medicare Other

## 2015-02-18 DIAGNOSIS — Z79899 Other long term (current) drug therapy: Secondary | ICD-10-CM | POA: Insufficient documentation

## 2015-02-18 DIAGNOSIS — F419 Anxiety disorder, unspecified: Secondary | ICD-10-CM | POA: Insufficient documentation

## 2015-02-18 DIAGNOSIS — Z8619 Personal history of other infectious and parasitic diseases: Secondary | ICD-10-CM | POA: Insufficient documentation

## 2015-02-18 DIAGNOSIS — Z8739 Personal history of other diseases of the musculoskeletal system and connective tissue: Secondary | ICD-10-CM | POA: Diagnosis not present

## 2015-02-18 DIAGNOSIS — G8929 Other chronic pain: Secondary | ICD-10-CM | POA: Diagnosis not present

## 2015-02-18 DIAGNOSIS — F329 Major depressive disorder, single episode, unspecified: Secondary | ICD-10-CM | POA: Insufficient documentation

## 2015-02-18 DIAGNOSIS — G40909 Epilepsy, unspecified, not intractable, without status epilepticus: Secondary | ICD-10-CM | POA: Insufficient documentation

## 2015-02-18 DIAGNOSIS — Z720 Tobacco use: Secondary | ICD-10-CM | POA: Insufficient documentation

## 2015-02-18 DIAGNOSIS — R569 Unspecified convulsions: Secondary | ICD-10-CM

## 2015-02-18 DIAGNOSIS — Z7982 Long term (current) use of aspirin: Secondary | ICD-10-CM | POA: Diagnosis not present

## 2015-02-18 DIAGNOSIS — J441 Chronic obstructive pulmonary disease with (acute) exacerbation: Secondary | ICD-10-CM | POA: Diagnosis not present

## 2015-02-18 LAB — CBC
HCT: 40.9 % (ref 39.0–52.0)
Hemoglobin: 13.5 g/dL (ref 13.0–17.0)
MCH: 33.2 pg (ref 26.0–34.0)
MCHC: 33 g/dL (ref 30.0–36.0)
MCV: 100.5 fL — ABNORMAL HIGH (ref 78.0–100.0)
PLATELETS: 212 10*3/uL (ref 150–400)
RBC: 4.07 MIL/uL — ABNORMAL LOW (ref 4.22–5.81)
RDW: 13.9 % (ref 11.5–15.5)
WBC: 8.6 10*3/uL (ref 4.0–10.5)

## 2015-02-18 LAB — BASIC METABOLIC PANEL
Anion gap: 6 (ref 5–15)
BUN: 14 mg/dL (ref 6–20)
CHLORIDE: 96 mmol/L — AB (ref 101–111)
CO2: 33 mmol/L — ABNORMAL HIGH (ref 22–32)
CREATININE: 0.67 mg/dL (ref 0.61–1.24)
Calcium: 9.2 mg/dL (ref 8.9–10.3)
GFR calc Af Amer: >60 mL/min (ref >60)
Glucose, Bld: 81 mg/dL (ref 65–99)
Potassium: 5 mmol/L (ref 3.5–5.1)
SODIUM: 135 mmol/L (ref 135–145)

## 2015-02-18 LAB — CBG MONITORING, ED: GLUCOSE-CAPILLARY: 68 mg/dL (ref 65–99)

## 2015-02-18 LAB — VALPROIC ACID LEVEL: Valproic Acid Lvl: 33 ug/mL — ABNORMAL LOW (ref 50.0–100.0)

## 2015-02-18 MED ORDER — LEVETIRACETAM 750 MG PO TABS
1500.0000 mg | ORAL_TABLET | Freq: Two times a day (BID) | ORAL | Status: DC
  Filled 2015-02-18: qty 2

## 2015-02-18 MED ORDER — DIVALPROEX SODIUM ER 500 MG PO TB24: 500.0000 mg | ORAL_TABLET | Freq: Two times a day (BID) | ORAL | Status: DC

## 2015-02-18 MED ORDER — DIVALPROEX SODIUM ER 500 MG PO TB24
500.0000 mg | ORAL_TABLET | Freq: Two times a day (BID) | ORAL | Status: DC
Start: 2015-02-18 — End: 2015-02-18
  Filled 2015-02-18: qty 1

## 2015-02-18 MED ORDER — LEVETIRACETAM 750 MG PO TABS: 1500.0000 mg | ORAL_TABLET | Freq: Two times a day (BID) | ORAL | Status: DC

## 2015-02-18 NOTE — Discharge Instructions (Signed)

## 2015-02-18 NOTE — ED Notes (Signed)
Patient discharged from [facility] on Sunday for seizures. Patient was inpatient. He presented to his doctor today for follow up from the admission and had a grand mal seizure at the doctor's office. Patient's doctor reports that he gave the patient  valium intravenous, but when EMS arrived there was no iv in place and the patient has bluish bruise to his left wrist where the iv valium was supposedly given.
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
## 2015-02-18 NOTE — ED Notes (Signed)
Patient transported to X-ray 

## 2015-02-18 NOTE — ED Notes (Signed)
Reviewed d/c instructions and medications with patient.  Pt verbalized understanding.  Pt stated he would call his friend for a ride home.

## 2015-02-18 NOTE — ED Notes (Signed)
Pt reports he has been in and out of the [facility] for seizures since last Saturday.  He reports he had two seizures in [place], and was admitted.  He reports another seizure earlier today that occurred while he was at his PCP. Pt has a cough, and reports that it began when he started taking seizure medication.  Pt reports he is almost out of seizure medications, but has had his doses today.

## 2015-02-19 ENCOUNTER — Emergency Department (HOSPITAL_COMMUNITY)
Admission: EM | Admit: 2015-02-19 | Discharge: 2015-02-19 | Disposition: A | Discharge status: Home / Self Care | Payer: Medicare Other | Attending: Emergency Medicine | Admitting: Emergency Medicine

## 2015-02-19 ENCOUNTER — Encounter (HOSPITAL_COMMUNITY): Payer: Self-pay

## 2015-02-19 DIAGNOSIS — F439 Reaction to severe stress, unspecified: Secondary | ICD-10-CM | POA: Insufficient documentation

## 2015-02-19 DIAGNOSIS — F329 Major depressive disorder, single episode, unspecified: Secondary | ICD-10-CM | POA: Insufficient documentation

## 2015-02-19 DIAGNOSIS — Z79899 Other long term (current) drug therapy: Secondary | ICD-10-CM | POA: Insufficient documentation

## 2015-02-19 DIAGNOSIS — Z7982 Long term (current) use of aspirin: Secondary | ICD-10-CM | POA: Insufficient documentation

## 2015-02-19 DIAGNOSIS — Z8619 Personal history of other infectious and parasitic diseases: Secondary | ICD-10-CM | POA: Diagnosis not present

## 2015-02-19 DIAGNOSIS — G40909 Epilepsy, unspecified, not intractable, without status epilepticus: Secondary | ICD-10-CM | POA: Diagnosis not present

## 2015-02-19 DIAGNOSIS — G8929 Other chronic pain: Secondary | ICD-10-CM | POA: Diagnosis not present

## 2015-02-19 DIAGNOSIS — Z720 Tobacco use: Secondary | ICD-10-CM | POA: Diagnosis not present

## 2015-02-19 DIAGNOSIS — Z8739 Personal history of other diseases of the musculoskeletal system and connective tissue: Secondary | ICD-10-CM | POA: Diagnosis not present

## 2015-02-19 DIAGNOSIS — F419 Anxiety disorder, unspecified: Secondary | ICD-10-CM | POA: Insufficient documentation

## 2015-02-19 DIAGNOSIS — Z760 Encounter for issue of repeat prescription: Secondary | ICD-10-CM | POA: Diagnosis present

## 2015-02-19 DIAGNOSIS — R52 Pain, unspecified: Secondary | ICD-10-CM | POA: Diagnosis present

## 2015-02-19 DIAGNOSIS — J441 Chronic obstructive pulmonary disease with (acute) exacerbation: Secondary | ICD-10-CM | POA: Insufficient documentation
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
## 2015-02-19 NOTE — ED Notes (Signed)
Pt was here earlier and couldn't find a ride home, he [place] [employer] [place] ride to [place] and about two hours later arrived back in the ED by EMS stating that he was in pain per EMS, the patient says that the cashier at the [employer] called EMS saying that he had a seizure, which cannot be confirmed. While patient was waiting this time he was called to be triaged and was outside smoking on both occasions.

## 2015-02-19 NOTE — ED Notes (Signed)
Per EMS, pt was picked up at a [employer] close to the ED.  Pt was just seen here tonight, did not have a ride home.  Pt reported to EMS that he was having neck and back pain.  Pt as soon as he arrived in the ED, he reports having a seizure.  Pt is A&Ox 4, and is ambulatory with steady gait.

## 2015-02-19 NOTE — ED Notes (Signed)
Pt called twice from triage with no answer 

## 2015-02-20 ENCOUNTER — Emergency Department
Admission: EM | Admit: 2015-02-20 | Discharge: 2015-02-20 | Disposition: A | Discharge status: Home / Self Care | Payer: Medicare Other | Attending: Emergency Medicine | Admitting: Emergency Medicine

## 2015-02-20 ENCOUNTER — Encounter: Payer: Self-pay | Admitting: *Deleted

## 2015-02-20 DIAGNOSIS — F419 Anxiety disorder, unspecified: Secondary | ICD-10-CM

## 2015-02-20 DIAGNOSIS — G8929 Other chronic pain: Secondary | ICD-10-CM

## 2015-02-20 DIAGNOSIS — F439 Reaction to severe stress, unspecified: Secondary | ICD-10-CM

## 2015-02-20 MED ORDER — ALPRAZOLAM 0.5 MG PO TABS
0.2500 mg | ORAL_TABLET | Freq: Once | ORAL | Status: AC
  Administered 2015-02-20: 0.25 mg via ORAL
  Filled 2015-02-20: qty 1

## 2015-02-20 NOTE — ED Notes (Signed)
Pt states he needs a refill for his pain and anxiety medications "enough to last until Monday". Pt states that he will have more seizures if he does not get his anxiety medication filled.

## 2015-02-20 NOTE — Discharge Instructions (Signed)
Chronic Pain Chronic pain can be defined as pain that is off and on and lasts for 3-6 months or longer. Many things cause chronic pain, which can make it difficult to make a diagnosis. There are many treatment options available for chronic pain. However, finding a treatment that works well for you may require trying various approaches until the right one is found. Many people benefit from a combination of two or more types of treatment to control their pain. SYMPTOMS  Chronic pain can occur anywhere in the body and can range from mild to very severe. Some types of chronic pain include:  Headache.  Low back pain.  Cancer pain.  Arthritis pain.  Neurogenic pain. This is pain resulting from damage to nerves. People with chronic pain may also have other symptoms such as:  Depression.  Anger.  Insomnia.  Anxiety. DIAGNOSIS  Your health care provider will help diagnose your condition over time. In many cases, the initial focus will be on excluding possible conditions that could be causing the pain. Depending on your symptoms, your health care provider may order tests to diagnose your condition. Some of these tests may include:   Blood tests.   CT scan.   MRI.   X-rays.   Ultrasounds.   Nerve conduction studies.  You may need to see a specialist.  TREATMENT  Finding treatment that works well may take time. You may be referred to a pain specialist. He or she may prescribe medicine or therapies, such as:   Mindful meditation or yoga.  Shots (injections) of numbing or pain-relieving medicines into the spine or area of pain.  Local electrical stimulation.  Acupuncture.   Massage therapy.   Aroma, color, light, or sound therapy.   Biofeedback.   Working with a physical therapist to keep from getting stiff.   Regular, gentle exercise.   Cognitive or behavioral therapy.   Group support.  Sometimes, surgery may be recommended.  HOME CARE INSTRUCTIONS    Take all medicines as directed by your health care provider.   Lessen stress in your life by relaxing and doing things such as listening to calming music.   Exercise or be active as directed by your health care provider.   Eat a healthy diet and include things such as vegetables, fruits, fish, and lean meats in your diet.   Keep all follow-up appointments with your health care provider.   Attend a support group with others suffering from chronic pain. SEEK MEDICAL CARE IF:   Your pain gets worse.   You develop a new pain that was not there before.   You cannot tolerate medicines given to you by your health care provider.   You have new symptoms since your last visit with your health care provider.  SEEK IMMEDIATE MEDICAL CARE IF:   You feel weak.   You have decreased sensation or numbness.   You lose control of bowel or bladder function.   Your pain suddenly gets much worse.   You develop shaking.  You develop chills.  You develop confusion.  You develop chest pain.  You develop shortness of breath.  MAKE SURE YOU:  Understand these instructions.  Will watch your condition.  Will get help right away if you are not doing well or get worse. Document Released: 02/18/2002 Document Revised: 01/29/2013 Document Reviewed: 11/22/2012 [facility] Patient Information 2015 [place], [place]. This information is not intended to replace advice given to you by your health care provider. Make sure you discuss any  questions you have with your health care provider.  Stress Stress-related medical problems are becoming increasingly common. The body has a built-in physical response to stressful situations. Faced with pressure, challenge or danger, we need to react quickly. Our bodies release hormones such as cortisol and adrenaline to help do this. These hormones are part of the "fight or flight" response and affect the metabolic rate, heart rate and blood pressure,  resulting in a heightened, stressed state that prepares the body for optimum performance in dealing with a stressful situation. It is likely that early man required these mechanisms to stay alive, but usually modern stresses do not call for this, and the same hormones released in today's world can damage health and reduce coping ability. CAUSES  Pressure to perform at work, at school or in sports.  Threats of physical violence.  Money worries.  Arguments.  Family conflicts.  Divorce or separation from significant other.  Bereavement.  New job or unemployment.  Changes in location.  Alcohol or drug abuse. SOMETIMES, THERE IS NO PARTICULAR REASON FOR DEVELOPING STRESS. Almost all people are at risk of being stressed at some time in their lives. It is important to know that some stress is temporary and some is long term.  Temporary stress will go away when a situation is resolved. Most people can cope with short periods of stress, and it can often be relieved by relaxing, taking a walk or getting any type of exercise, chatting through issues with friends, or having a good night's sleep.  Chronic (long-term, continuous) stress is much harder to deal with. It can be psychologically and emotionally damaging. It can be harmful both for an individual and for friends and family. SYMPTOMS Everyone reacts to stress differently. There are some common effects that help [place] recognize it. In times of extreme stress, people may:  Shake uncontrollably.  Breathe faster and deeper than normal (hyperventilate).  Vomit.  For people with asthma, stress can trigger an attack.  For some people, stress may trigger migraine headaches, ulcers, and body pain. PHYSICAL EFFECTS OF STRESS MAY INCLUDE:  Loss of energy.  Skin problems.  Aches and pains resulting from tense muscles, including neck ache, backache and tension headaches.  Increased pain from arthritis and other conditions.  Irregular  heart beat (palpitations).  Periods of irritability or anger.  Apathy or depression.  Anxiety (feeling uptight or worrying).  Unusual behavior.  Loss of appetite.  Comfort eating.  Lack of concentration.  Loss of, or decreased, sex-drive.  Increased smoking, drinking, or recreational drug use.  For women, missed periods.  Ulcers, joint pain, and muscle pain. Post-traumatic stress is the stress caused by any serious accident, strong emotional damage, or extremely difficult or violent experience such as rape or war. Post-traumatic stress victims can experience mixtures of emotions such as fear, shame, depression, guilt or anger. It may include recurrent memories or images that may be haunting. These feelings can last for weeks, months or even years after the traumatic event that triggered them. Specialized treatment, possibly with medicines and psychological therapies, is available. If stress is causing physical symptoms, severe distress or making it difficult for you to function as normal, it is worth seeing your caregiver. It is important to remember that although stress is a usual part of life, extreme or prolonged stress can lead to other illnesses that will need treatment. It is better to visit a doctor sooner rather than later. Stress has been linked to the development of high blood  pressure and heart disease, as well as insomnia and depression. There is no diagnostic test for stress since everyone reacts to it differently. But a caregiver will be able to spot the physical symptoms, such as:  Headaches.  Shingles.  Ulcers. Emotional distress such as intense worry, low mood or irritability should be detected when the doctor asks pertinent questions to identify any underlying problems that might be the cause. In case there are physical reasons for the symptoms, the doctor may also want to do some tests to exclude certain conditions. If you feel that you are suffering from stress,  try to identify the aspects of your life that are causing it. Sometimes you may not be able to change or avoid them, but even a small change can have a positive ripple effect. A simple lifestyle change can make all the difference. STRATEGIES THAT CAN HELP DEAL WITH STRESS:  Delegating or sharing responsibilities.  Avoiding confrontations.  Learning to be more assertive.  Regular exercise.  Avoid using alcohol or street drugs to cope.  Eating a healthy, balanced diet, rich in fruit and vegetables and proteins.  Finding humor or absurdity in stressful situations.  Never taking on more than you know you can handle comfortably.  Organizing your time better to get as much done as possible.  Talking to friends or family and sharing your thoughts and fears.  Listening to music or relaxation tapes.  Relaxation techniques like deep breathing, meditation, and yoga.  Tensing and then relaxing your muscles, starting at the toes and working up to the head and neck. If you think that you would benefit from help, either in identifying the things that are causing your stress or in learning techniques to help you relax, see a caregiver who is capable of helping you with this. Rather than relying on medications, it is usually better to try and identify the things in your life that are causing stress and try to deal with them. There are many techniques of managing stress including counseling, psychotherapy, aromatherapy, yoga, and exercise. Your caregiver can help you determine what is best for you. Document Released: 08/19/2002 Document Revised: 06/03/2013 Document Reviewed: 07/16/2007 [facility] Patient Information 2015 [place], [place]. This information is not intended to replace advice given to you by your health care provider. Make sure you discuss any questions you have with your health care provider.

## 2015-02-20 NOTE — ED Notes (Signed)
Pt. States he has epilepsy and has seizures.  Pt. States he has had 2 seizures in the past 2 days.  Pt. States he was prescribed Depakote.  Pt. States he does not have medication for anxiety and pain.

## 2015-02-20 NOTE — ED Notes (Signed)
Pt. Going to waiting room to call for ride or call a cab.
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
## 2015-02-21 ENCOUNTER — Observation Stay
Admission: EM | Admit: 2015-02-21 | Discharge: 2015-02-22 | Disposition: A | Discharge status: Home / Self Care | Payer: Medicare Other | Attending: Internal Medicine | Admitting: Internal Medicine

## 2015-02-21 ENCOUNTER — Emergency Department: Payer: Medicare Other

## 2015-02-21 ENCOUNTER — Encounter: Payer: Self-pay | Admitting: Emergency Medicine

## 2015-02-21 DIAGNOSIS — G8929 Other chronic pain: Secondary | ICD-10-CM | POA: Diagnosis not present

## 2015-02-21 DIAGNOSIS — E871 Hypo-osmolality and hyponatremia: Secondary | ICD-10-CM | POA: Insufficient documentation

## 2015-02-21 DIAGNOSIS — M549 Dorsalgia, unspecified: Secondary | ICD-10-CM | POA: Insufficient documentation

## 2015-02-21 DIAGNOSIS — J449 Chronic obstructive pulmonary disease, unspecified: Secondary | ICD-10-CM | POA: Diagnosis not present

## 2015-02-21 DIAGNOSIS — F17210 Nicotine dependence, cigarettes, uncomplicated: Secondary | ICD-10-CM | POA: Insufficient documentation

## 2015-02-21 DIAGNOSIS — Z7982 Long term (current) use of aspirin: Secondary | ICD-10-CM | POA: Insufficient documentation

## 2015-02-21 DIAGNOSIS — F411 Generalized anxiety disorder: Secondary | ICD-10-CM | POA: Insufficient documentation

## 2015-02-21 DIAGNOSIS — G40909 Epilepsy, unspecified, not intractable, without status epilepticus: Secondary | ICD-10-CM | POA: Diagnosis not present

## 2015-02-21 DIAGNOSIS — Z79899 Other long term (current) drug therapy: Secondary | ICD-10-CM | POA: Insufficient documentation

## 2015-02-21 DIAGNOSIS — F329 Major depressive disorder, single episode, unspecified: Secondary | ICD-10-CM | POA: Diagnosis not present

## 2015-02-21 DIAGNOSIS — R569 Unspecified convulsions: Secondary | ICD-10-CM

## 2015-02-21 LAB — COMPREHENSIVE METABOLIC PANEL
ALT: 17 U/L (ref 17–63)
AST: 29 U/L (ref 15–41)
Albumin: 4.2 g/dL (ref 3.5–5.0)
Alkaline Phosphatase: 55 U/L (ref 38–126)
Anion gap: 17 — ABNORMAL HIGH (ref 5–15)
BUN: 19 mg/dL (ref 6–20)
CO2: 25 mmol/L (ref 22–32)
Calcium: 9.7 mg/dL (ref 8.9–10.3)
Chloride: 97 mmol/L — ABNORMAL LOW (ref 101–111)
Creatinine, Ser: 0.72 mg/dL (ref 0.61–1.24)
GFR calc Af Amer: >60 mL/min (ref >60)
GFR calc non Af Amer: >60 mL/min (ref >60)
Glucose, Bld: 109 mg/dL — ABNORMAL HIGH (ref 65–99)
Potassium: 4.1 mmol/L (ref 3.5–5.1)
Sodium: 139 mmol/L (ref 135–145)
Total Bilirubin: 0.8 mg/dL (ref 0.3–1.2)
Total Protein: 7.6 g/dL (ref 6.5–8.1)

## 2015-02-21 LAB — CBC WITH DIFFERENTIAL/PLATELET
Basophils Absolute: 0.1 10*3/uL (ref 0–0.1)
Basophils Relative: 1 %
Eosinophils Absolute: 0.1 10*3/uL (ref 0–0.7)
Eosinophils Relative: 1 %
HCT: 41.3 % (ref 40.0–52.0)
Hemoglobin: 13.7 g/dL (ref 13.0–18.0)
Lymphocytes Relative: 28 %
Lymphs Abs: 4.5 10*3/uL — ABNORMAL HIGH (ref 1.0–3.6)
MCH: 34.3 pg — ABNORMAL HIGH (ref 26.0–34.0)
MCHC: 33.2 g/dL (ref 32.0–36.0)
MCV: 103.4 fL — ABNORMAL HIGH (ref 80.0–100.0)
Monocytes Absolute: 1 10*3/uL (ref 0.2–1.0)
Monocytes Relative: 6 %
Neutro Abs: 10.4 10*3/uL — ABNORMAL HIGH (ref 1.4–6.5)
Neutrophils Relative %: 64 %
Platelets: 265 10*3/uL (ref 150–440)
RBC: 3.99 MIL/uL — ABNORMAL LOW (ref 4.40–5.90)
RDW: 14.8 % — ABNORMAL HIGH (ref 11.5–14.5)
WBC: 16.1 10*3/uL — ABNORMAL HIGH (ref 3.8–10.6)

## 2015-02-21 LAB — TROPONIN I: Troponin I: <0.03 ng/mL (ref <0.031)

## 2015-02-21 MED ORDER — SODIUM CHLORIDE 0.9 % IV SOLN
1000.0000 mg | Freq: Once | INTRAVENOUS | Status: AC
  Administered 2015-02-22: 1000 mg via INTRAVENOUS
  Filled 2015-02-21: qty 10

## 2015-02-21 MED ORDER — VALPROATE SODIUM 500 MG/5ML IV SOLN
500.0000 mg | Freq: Once | INTRAVENOUS | Status: DC
  Filled 2015-02-21: qty 5

## 2015-02-21 MED ORDER — SODIUM CHLORIDE 0.9 % IV BOLUS (SEPSIS): 1000.0000 mL | Freq: Once | INTRAVENOUS | Status: DC
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
## 2015-02-21 NOTE — ED Notes (Signed)
Patient transported to CT 

## 2015-02-21 NOTE — ED Notes (Signed)
Pt comes in via EMS with c/o seizures.  Patient has had 3 seizures today and given 2.5 midazolam in route along with 5 mg albuterol.  Patient is from home, alert to person and place via EMS.  Patient was nonverbal for EMS during transportation.  Wheezing bilateral VS:  111/75, 80 HR, 91 CBG, 91% room air, NSR on EKG.

## 2015-02-22 DIAGNOSIS — G40909 Epilepsy, unspecified, not intractable, without status epilepticus: Secondary | ICD-10-CM

## 2015-02-22 LAB — URINE DRUG SCREEN, QUALITATIVE (ARMC ONLY)
AMPHETAMINES, UR SCREEN: NOT DETECTED
Barbiturates, Ur Screen: NOT DETECTED
Benzodiazepine, Ur Scrn: POSITIVE — AB
Cannabinoid 50 Ng, Ur ~~LOC~~: NOT DETECTED
Cocaine Metabolite,Ur ~~LOC~~: NOT DETECTED
MDMA (ECSTASY) UR SCREEN: NOT DETECTED
METHADONE SCREEN, URINE: NOT DETECTED
OPIATE, UR SCREEN: NOT DETECTED
PHENCYCLIDINE (PCP) UR S: NOT DETECTED
Tricyclic, Ur Screen: NOT DETECTED

## 2015-02-22 LAB — VALPROIC ACID LEVEL: Valproic Acid Lvl: 69 ug/mL (ref 50.0–100.0)

## 2015-02-22 LAB — ETHANOL: Alcohol, Ethyl (B): <5 mg/dL (ref <5)

## 2015-02-22 LAB — TSH: TSH: 3.419 u[IU]/mL (ref 0.350–4.500)

## 2015-02-22 MED ORDER — ACETAMINOPHEN 500 MG PO TABS
1000.0000 mg | ORAL_TABLET | Freq: Once | ORAL | Status: AC
  Administered 2015-02-22: 1000 mg via ORAL
  Filled 2015-02-22: qty 2

## 2015-02-22 NOTE — ED Notes (Signed)
Dr Inocencio Homes in room with patient who is insisting that he needs leave because "I have a full day with appointments."  Patient instructed regarding potential consequences of leaving against medical advice.

## 2015-02-24 LAB — LEVETIRACETAM LEVEL: Levetiracetam Lvl: 4 ug/mL — ABNORMAL LOW (ref 10.0–40.0)

## 2015-03-03 ENCOUNTER — Encounter: Payer: Self-pay | Admitting: Emergency Medicine

## 2015-03-03 ENCOUNTER — Emergency Department
Admission: EM | Admit: 2015-03-03 | Discharge: 2015-03-03 | Disposition: A | Discharge status: Home / Self Care | Payer: Medicare Other | Attending: Emergency Medicine | Admitting: Emergency Medicine

## 2015-03-03 DIAGNOSIS — Z9114 Patient's other noncompliance with medication regimen: Secondary | ICD-10-CM

## 2015-03-03 DIAGNOSIS — Z720 Tobacco use: Secondary | ICD-10-CM | POA: Diagnosis not present

## 2015-03-03 DIAGNOSIS — Z9119 Patient's noncompliance with other medical treatment and regimen: Secondary | ICD-10-CM | POA: Diagnosis not present

## 2015-03-03 DIAGNOSIS — Z7982 Long term (current) use of aspirin: Secondary | ICD-10-CM | POA: Diagnosis not present

## 2015-03-03 DIAGNOSIS — Z79899 Other long term (current) drug therapy: Secondary | ICD-10-CM | POA: Insufficient documentation

## 2015-03-03 DIAGNOSIS — Z760 Encounter for issue of repeat prescription: Secondary | ICD-10-CM | POA: Diagnosis not present

## 2015-03-03 DIAGNOSIS — R569 Unspecified convulsions: Secondary | ICD-10-CM | POA: Diagnosis present

## 2015-03-03 LAB — VALPROIC ACID LEVEL: Valproic Acid Lvl: <10 ug/mL — ABNORMAL LOW (ref 50.0–100.0)

## 2015-03-03 LAB — BASIC METABOLIC PANEL
ANION GAP: 9 (ref 5–15)
BUN: 16 mg/dL (ref 6–20)
CHLORIDE: 93 mmol/L — AB (ref 101–111)
CO2: 30 mmol/L (ref 22–32)
Calcium: 9.6 mg/dL (ref 8.9–10.3)
Creatinine, Ser: 0.65 mg/dL (ref 0.61–1.24)
GFR calc Af Amer: >60 mL/min (ref >60)
GLUCOSE: 104 mg/dL — AB (ref 65–99)
POTASSIUM: 4 mmol/L (ref 3.5–5.1)
Sodium: 132 mmol/L — ABNORMAL LOW (ref 135–145)

## 2015-03-03 LAB — GLUCOSE, CAPILLARY: Glucose-Capillary: 173 mg/dL — ABNORMAL HIGH (ref 65–99)

## 2015-03-03 MED ORDER — ALPRAZOLAM 0.5 MG PO TABS
0.2500 mg | ORAL_TABLET | ORAL | Status: AC
  Administered 2015-03-03: 0.25 mg via ORAL
  Filled 2015-03-03: qty 1

## 2015-03-03 MED ORDER — LEVETIRACETAM 750 MG PO TABS: 1500.0000 mg | ORAL_TABLET | Freq: Two times a day (BID) | ORAL | Status: DC

## 2015-03-03 MED ORDER — DIVALPROEX SODIUM 500 MG PO DR TAB
1000.0000 mg | DELAYED_RELEASE_TABLET | ORAL | Status: AC
  Administered 2015-03-03: 1000 mg via ORAL
  Filled 2015-03-03: qty 2

## 2015-03-03 NOTE — ED Notes (Addendum)
Patient states "all Im looking for is a refill on my prescriptions.  I had a seizure because I got upset that Im out of my medicine.  My appointment with my doctor isn't until October 12."

## 2015-03-03 NOTE — ED Notes (Signed)
Pt to ed from home via [facility] with reports of having a seizure earlier today. Pt states last seizure was about two weeks ago. Pt is not currently taking depakote and just takes as needed.

## 2015-03-03 NOTE — Discharge Instructions (Signed)

## 2015-03-03 NOTE — ED Notes (Signed)
MD at bedside. 
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
## 2015-03-04 ENCOUNTER — Encounter: Payer: Self-pay | Admitting: *Deleted

## 2015-03-04 ENCOUNTER — Emergency Department
Admission: EM | Admit: 2015-03-04 | Discharge: 2015-03-04 | Disposition: A | Discharge status: Home / Self Care | Payer: Medicare Other | Attending: Emergency Medicine | Admitting: Emergency Medicine

## 2015-03-04 DIAGNOSIS — M549 Dorsalgia, unspecified: Secondary | ICD-10-CM | POA: Diagnosis not present

## 2015-03-04 DIAGNOSIS — G8929 Other chronic pain: Secondary | ICD-10-CM | POA: Insufficient documentation

## 2015-03-04 DIAGNOSIS — Z79899 Other long term (current) drug therapy: Secondary | ICD-10-CM | POA: Diagnosis not present

## 2015-03-04 DIAGNOSIS — Z760 Encounter for issue of repeat prescription: Secondary | ICD-10-CM | POA: Insufficient documentation

## 2015-03-04 DIAGNOSIS — Z7982 Long term (current) use of aspirin: Secondary | ICD-10-CM | POA: Insufficient documentation

## 2015-03-04 DIAGNOSIS — Z720 Tobacco use: Secondary | ICD-10-CM | POA: Insufficient documentation

## 2015-03-04 DIAGNOSIS — Z7289 Other problems related to lifestyle: Secondary | ICD-10-CM | POA: Diagnosis not present

## 2015-03-04 NOTE — ED Notes (Signed)
Spoke with pt regarding going to pain management - states he was kicked out and that it didn't work. Pt asking what he can take that is otc - reiterated what the pa had told him which was tylenol, nsaids or benadryl. Also reiterated that pt needs to follow up with his pcp and his neurologist regarding any changes to his medications.

## 2015-03-04 NOTE — ED Notes (Signed)
Pt states [facility] would not give him a refill on his xanax d/t it being too early. He had last refilled his xanax on 02/25/2015 for a 30 day supply and none of the pharmacies will refill his prescriptions. He spoke with his pcp today and he will not refill his prescriptions either.
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
## 2015-03-05 ENCOUNTER — Emergency Department (HOSPITAL_COMMUNITY): Payer: Medicare Other

## 2015-03-05 ENCOUNTER — Emergency Department (HOSPITAL_COMMUNITY)
Admission: EM | Admit: 2015-03-05 | Discharge: 2015-03-05 | Disposition: A | Discharge status: Home / Self Care | Payer: Medicare Other | Attending: Emergency Medicine | Admitting: Emergency Medicine

## 2015-03-05 ENCOUNTER — Encounter (HOSPITAL_COMMUNITY): Payer: Self-pay

## 2015-03-05 DIAGNOSIS — S199XXA Unspecified injury of neck, initial encounter: Secondary | ICD-10-CM | POA: Diagnosis not present

## 2015-03-05 DIAGNOSIS — J449 Chronic obstructive pulmonary disease, unspecified: Secondary | ICD-10-CM | POA: Insufficient documentation

## 2015-03-05 DIAGNOSIS — Z720 Tobacco use: Secondary | ICD-10-CM | POA: Diagnosis not present

## 2015-03-05 DIAGNOSIS — Y92009 Unspecified place in unspecified non-institutional (private) residence as the place of occurrence of the external cause: Secondary | ICD-10-CM

## 2015-03-05 DIAGNOSIS — Z8739 Personal history of other diseases of the musculoskeletal system and connective tissue: Secondary | ICD-10-CM | POA: Insufficient documentation

## 2015-03-05 DIAGNOSIS — F329 Major depressive disorder, single episode, unspecified: Secondary | ICD-10-CM | POA: Insufficient documentation

## 2015-03-05 DIAGNOSIS — Z8619 Personal history of other infectious and parasitic diseases: Secondary | ICD-10-CM | POA: Diagnosis not present

## 2015-03-05 DIAGNOSIS — Z79899 Other long term (current) drug therapy: Secondary | ICD-10-CM | POA: Insufficient documentation

## 2015-03-05 DIAGNOSIS — G8929 Other chronic pain: Secondary | ICD-10-CM | POA: Diagnosis not present

## 2015-03-05 DIAGNOSIS — G40909 Epilepsy, unspecified, not intractable, without status epilepticus: Secondary | ICD-10-CM | POA: Insufficient documentation

## 2015-03-05 DIAGNOSIS — Y998 Other external cause status: Secondary | ICD-10-CM | POA: Diagnosis not present

## 2015-03-05 DIAGNOSIS — F419 Anxiety disorder, unspecified: Secondary | ICD-10-CM | POA: Insufficient documentation

## 2015-03-05 DIAGNOSIS — S3992XA Unspecified injury of lower back, initial encounter: Secondary | ICD-10-CM | POA: Insufficient documentation

## 2015-03-05 DIAGNOSIS — W1839XA Other fall on same level, initial encounter: Secondary | ICD-10-CM | POA: Insufficient documentation

## 2015-03-05 DIAGNOSIS — Y92320 Baseball field as the place of occurrence of the external cause: Secondary | ICD-10-CM | POA: Diagnosis not present

## 2015-03-05 DIAGNOSIS — Y9364 Activity, baseball: Secondary | ICD-10-CM | POA: Diagnosis not present

## 2015-03-05 DIAGNOSIS — E871 Hypo-osmolality and hyponatremia: Secondary | ICD-10-CM

## 2015-03-05 DIAGNOSIS — D7589 Other specified diseases of blood and blood-forming organs: Secondary | ICD-10-CM

## 2015-03-05 DIAGNOSIS — M549 Dorsalgia, unspecified: Secondary | ICD-10-CM

## 2015-03-05 DIAGNOSIS — W19XXXA Unspecified fall, initial encounter: Secondary | ICD-10-CM

## 2015-03-05 LAB — CBC WITH DIFFERENTIAL/PLATELET
BASOS PCT: 0 %
Basophils Absolute: 0 10*3/uL (ref 0.0–0.1)
EOS ABS: 0.1 10*3/uL (ref 0.0–0.7)
EOS PCT: 1 %
HCT: 37.9 % — ABNORMAL LOW (ref 39.0–52.0)
HEMOGLOBIN: 12.9 g/dL — AB (ref 13.0–17.0)
LYMPHS ABS: 2.8 10*3/uL (ref 0.7–4.0)
Lymphocytes Relative: 25 %
MCH: 34.1 pg — AB (ref 26.0–34.0)
MCHC: 34 g/dL (ref 30.0–36.0)
MCV: 100.3 fL — ABNORMAL HIGH (ref 78.0–100.0)
Monocytes Absolute: 0.8 10*3/uL (ref 0.1–1.0)
Monocytes Relative: 7 %
NEUTROS PCT: 67 %
Neutro Abs: 7.4 10*3/uL (ref 1.7–7.7)
PLATELETS: 223 10*3/uL (ref 150–400)
RBC: 3.78 MIL/uL — AB (ref 4.22–5.81)
RDW: 14.1 % (ref 11.5–15.5)
WBC: 11.1 10*3/uL — AB (ref 4.0–10.5)

## 2015-03-05 LAB — URINALYSIS, ROUTINE W REFLEX MICROSCOPIC
BILIRUBIN URINE: NEGATIVE
GLUCOSE, UA: NEGATIVE mg/dL
Hgb urine dipstick: NEGATIVE
KETONES UR: NEGATIVE mg/dL
LEUKOCYTES UA: NEGATIVE
NITRITE: NEGATIVE
PH: 6.5 (ref 5.0–8.0)
PROTEIN: NEGATIVE mg/dL
Specific Gravity, Urine: 1.018 (ref 1.005–1.030)
Urobilinogen, UA: 1 mg/dL (ref 0.0–1.0)

## 2015-03-05 LAB — BASIC METABOLIC PANEL
ANION GAP: 4 — AB (ref 5–15)
BUN: 17 mg/dL (ref 6–20)
CHLORIDE: 92 mmol/L — AB (ref 101–111)
CO2: 33 mmol/L — AB (ref 22–32)
Calcium: 8.8 mg/dL — ABNORMAL LOW (ref 8.9–10.3)
Creatinine, Ser: 0.63 mg/dL (ref 0.61–1.24)
GFR calc Af Amer: >60 mL/min (ref >60)
GLUCOSE: 88 mg/dL (ref 65–99)
POTASSIUM: 4.1 mmol/L (ref 3.5–5.1)
Sodium: 129 mmol/L — ABNORMAL LOW (ref 135–145)

## 2015-03-05 LAB — RAPID URINE DRUG SCREEN, HOSP PERFORMED
Amphetamines: NOT DETECTED
BARBITURATES: NOT DETECTED
Benzodiazepines: NOT DETECTED
Cocaine: NOT DETECTED
Opiates: NOT DETECTED
Tetrahydrocannabinol: NOT DETECTED

## 2015-03-05 LAB — VALPROIC ACID LEVEL: VALPROIC ACID LVL: 11 ug/mL — AB (ref 50.0–100.0)

## 2015-03-05 MED ORDER — NICOTINE 21 MG/24HR TD PT24
21.0000 mg | MEDICATED_PATCH | Freq: Every day | TRANSDERMAL | Status: DC
  Administered 2015-03-05: 21 mg via TRANSDERMAL
  Filled 2015-03-05: qty 1

## 2015-03-05 MED ORDER — DIVALPROEX SODIUM 250 MG PO DR TAB: 500.0000 mg | DELAYED_RELEASE_TABLET | Freq: Once | ORAL | Status: DC

## 2015-03-05 MED ORDER — VALPROIC ACID 250 MG PO CAPS
500.0000 mg | ORAL_CAPSULE | Freq: Once | ORAL | Status: AC
  Administered 2015-03-05: 500 mg via ORAL
  Filled 2015-03-05: qty 2

## 2015-03-05 MED ORDER — LEVETIRACETAM 750 MG PO TABS: 1500.0000 mg | ORAL_TABLET | Freq: Once | ORAL | Status: DC

## 2015-03-05 MED ORDER — DIVALPROEX SODIUM ER 500 MG PO TB24: 500.0000 mg | ORAL_TABLET | Freq: Two times a day (BID) | ORAL | Status: DC

## 2015-03-05 MED ORDER — OXYCODONE-ACETAMINOPHEN 5-325 MG PO TABS
1.0000 | ORAL_TABLET | Freq: Once | ORAL | Status: AC
  Administered 2015-03-05: 1 via ORAL
  Filled 2015-03-05: qty 1

## 2015-03-05 MED ORDER — LEVETIRACETAM 750 MG PO TABS: 1500.0000 mg | ORAL_TABLET | Freq: Two times a day (BID) | ORAL | Status: DC

## 2015-03-05 MED ORDER — ALPRAZOLAM 0.25 MG PO TABS
0.2500 mg | ORAL_TABLET | Freq: Once | ORAL | Status: AC
  Administered 2015-03-05: 0.25 mg via ORAL
  Filled 2015-03-05: qty 1

## 2015-03-05 NOTE — ED Notes (Signed)
This RN took the nicotine patch off the pt's shoulder, and called a cab for the pt.
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
## 2015-03-05 NOTE — Discharge Instructions (Signed)
Make sure to take your seizure medications exactly as prescribed - if you don't, you will have more seizures.  You need to get all of your narcotic prescriptions and benzodiazepine prescriptions from your primary care provider. If dose adjustments have to be made, he will make them.
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
## 2015-03-06 ENCOUNTER — Encounter: Payer: Self-pay | Admitting: Emergency Medicine

## 2015-03-06 ENCOUNTER — Emergency Department
Admission: EM | Admit: 2015-03-06 | Discharge: 2015-03-06 | Disposition: A | Discharge status: Home / Self Care | Payer: Medicare Other | Attending: Student | Admitting: Student

## 2015-03-06 DIAGNOSIS — Z720 Tobacco use: Secondary | ICD-10-CM | POA: Insufficient documentation

## 2015-03-06 DIAGNOSIS — Z7982 Long term (current) use of aspirin: Secondary | ICD-10-CM | POA: Insufficient documentation

## 2015-03-06 DIAGNOSIS — Z79899 Other long term (current) drug therapy: Secondary | ICD-10-CM | POA: Insufficient documentation

## 2015-03-06 DIAGNOSIS — M549 Dorsalgia, unspecified: Secondary | ICD-10-CM | POA: Insufficient documentation

## 2015-03-06 DIAGNOSIS — G8929 Other chronic pain: Secondary | ICD-10-CM | POA: Diagnosis not present

## 2015-03-06 DIAGNOSIS — Z760 Encounter for issue of repeat prescription: Secondary | ICD-10-CM | POA: Diagnosis present

## 2015-03-06 DIAGNOSIS — Z008 Encounter for other general examination: Secondary | ICD-10-CM | POA: Diagnosis not present

## 2015-03-06 MED ORDER — LEVETIRACETAM 750 MG PO TABS: 1500.0000 mg | ORAL_TABLET | Freq: Two times a day (BID) | ORAL | Status: DC

## 2015-03-06 MED ORDER — DIVALPROEX SODIUM 500 MG PO DR TAB
500.0000 mg | DELAYED_RELEASE_TABLET | Freq: Once | ORAL | Status: AC
Start: 2015-03-06 — End: 2015-03-06
  Administered 2015-03-06: 500 mg via ORAL
  Filled 2015-03-06: qty 1

## 2015-03-06 MED ORDER — LEVETIRACETAM 500 MG PO TABS
1500.0000 mg | ORAL_TABLET | Freq: Once | ORAL | Status: AC
  Administered 2015-03-06: 1500 mg via ORAL
  Filled 2015-03-06: qty 3

## 2015-03-06 MED ORDER — ALPRAZOLAM 0.25 MG PO TABS: 0.2500 mg | ORAL_TABLET | Freq: Two times a day (BID) | ORAL | Status: DC | PRN

## 2015-03-06 MED ORDER — DIVALPROEX SODIUM 500 MG PO DR TAB: 500.0000 mg | DELAYED_RELEASE_TABLET | Freq: Two times a day (BID) | ORAL | Status: DC
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
## 2015-03-06 NOTE — Discharge Instructions (Signed)
Medication Refill, Emergency Department °We have refilled your medication today as a courtesy to you. It is best for your medical care, however, to take care of getting refills done through your primary caregiver's office. They have your records and can do a better job of follow-up than we can in the emergency department. °On maintenance medications, we often only prescribe enough medications to get you by until you are able to see your regular caregiver. This is a more expensive way to refill medications. °In the future, please plan for refills so that you will not have to use the emergency department for this. °Thank you for your help. Your help allows us to better take care of the daily emergencies that enter our department. °Document Released: 09/15/2003 Document Revised: 08/21/2011 Document Reviewed: 09/05/2013 °[employer]® Patient Information ©2015 [employer]. This information is not intended to replace advice given to you by your health care provider. Make sure you discuss any questions you have with your health care provider. ° °

## 2015-03-12 ENCOUNTER — Emergency Department
Admission: EM | Admit: 2015-03-12 | Discharge: 2015-03-12 | Disposition: A | Discharge status: Home / Self Care | Payer: Medicare Other | Attending: Emergency Medicine | Admitting: Emergency Medicine

## 2015-03-12 ENCOUNTER — Encounter: Payer: Self-pay | Admitting: *Deleted

## 2015-03-12 DIAGNOSIS — Z760 Encounter for issue of repeat prescription: Secondary | ICD-10-CM | POA: Insufficient documentation

## 2015-03-12 DIAGNOSIS — Z7982 Long term (current) use of aspirin: Secondary | ICD-10-CM | POA: Insufficient documentation

## 2015-03-12 DIAGNOSIS — Z79899 Other long term (current) drug therapy: Secondary | ICD-10-CM | POA: Diagnosis not present

## 2015-03-12 DIAGNOSIS — Z720 Tobacco use: Secondary | ICD-10-CM | POA: Diagnosis not present

## 2015-03-12 MED ORDER — ALPRAZOLAM 0.25 MG PO TABS: 0.2500 mg | ORAL_TABLET | Freq: Two times a day (BID) | ORAL | Status: DC

## 2015-03-12 NOTE — ED Notes (Signed)
Pt seen and assessed by provider, ptt provider notes for details

## 2015-03-12 NOTE — Discharge Instructions (Signed)
Medication Refill, Emergency Department We have refilled your medication today as a courtesy to you. It is best for your medical care, however, to take care of getting refills done through your primary caregiver's office. They have your records and can do a better job of follow-up than we can in the emergency department. On maintenance medications, we often only prescribe enough medications to get you by until you are able to see your regular caregiver. This is a more expensive way to refill medications. In the future, please plan for refills so that you will not have to use the emergency department for this. Thank you for your help. Your help allows [place] to better take care of the daily emergencies that enter our department. Document Released: 09/15/2003 Document Revised: 08/21/2011 Document Reviewed: 09/05/2013 [facility] Patient Information 2015 [place], [place]. This information is not intended to replace advice given to you by your health care provider. Make sure you discuss any questions you have with your health care provider.  You MUST follow-up with Dr. Clarene Duke for any further refills of this chronic medicine. You will NOT be provided with another refill from this emergency department, without proof of theft.
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
## 2015-03-12 NOTE — ED Notes (Signed)
Pt states continues to not have medication filled. States had seizure yesterday. Taking depakote and keppra.

## 2015-03-12 NOTE — ED Notes (Addendum)
Pt states he needs to have his xanax filled because that's what helps his seizures.

## 2015-03-17 ENCOUNTER — Inpatient Hospital Stay
Admission: EM | Admit: 2015-03-17 | Discharge: 2015-04-13 | DRG: 189 | Disposition: E | Payer: Medicare Other | Attending: Internal Medicine | Admitting: Internal Medicine

## 2015-03-17 ENCOUNTER — Other Ambulatory Visit: Payer: Self-pay

## 2015-03-17 ENCOUNTER — Emergency Department: Payer: Medicare Other

## 2015-03-17 ENCOUNTER — Encounter: Payer: Self-pay | Admitting: Internal Medicine

## 2015-03-17 DIAGNOSIS — G8929 Other chronic pain: Secondary | ICD-10-CM | POA: Diagnosis present

## 2015-03-17 DIAGNOSIS — R627 Adult failure to thrive: Secondary | ICD-10-CM | POA: Diagnosis present

## 2015-03-17 DIAGNOSIS — Z833 Family history of diabetes mellitus: Secondary | ICD-10-CM

## 2015-03-17 DIAGNOSIS — R569 Unspecified convulsions: Secondary | ICD-10-CM | POA: Diagnosis present

## 2015-03-17 DIAGNOSIS — J9620 Acute and chronic respiratory failure, unspecified whether with hypoxia or hypercapnia: Secondary | ICD-10-CM | POA: Diagnosis present

## 2015-03-17 DIAGNOSIS — N390 Urinary tract infection, site not specified: Secondary | ICD-10-CM | POA: Diagnosis present

## 2015-03-17 DIAGNOSIS — J690 Pneumonitis due to inhalation of food and vomit: Secondary | ICD-10-CM | POA: Diagnosis present

## 2015-03-17 DIAGNOSIS — Z9119 Patient's noncompliance with other medical treatment and regimen: Secondary | ICD-10-CM | POA: Diagnosis not present

## 2015-03-17 DIAGNOSIS — G40909 Epilepsy, unspecified, not intractable, without status epilepticus: Secondary | ICD-10-CM | POA: Diagnosis present

## 2015-03-17 DIAGNOSIS — J449 Chronic obstructive pulmonary disease, unspecified: Secondary | ICD-10-CM | POA: Diagnosis present

## 2015-03-17 DIAGNOSIS — L89151 Pressure ulcer of sacral region, stage 1: Secondary | ICD-10-CM | POA: Diagnosis present

## 2015-03-17 DIAGNOSIS — G9341 Metabolic encephalopathy: Secondary | ICD-10-CM | POA: Diagnosis present

## 2015-03-17 DIAGNOSIS — Z8249 Family history of ischemic heart disease and other diseases of the circulatory system: Secondary | ICD-10-CM | POA: Diagnosis not present

## 2015-03-17 DIAGNOSIS — E43 Unspecified severe protein-calorie malnutrition: Secondary | ICD-10-CM | POA: Diagnosis present

## 2015-03-17 DIAGNOSIS — R739 Hyperglycemia, unspecified: Secondary | ICD-10-CM | POA: Diagnosis present

## 2015-03-17 DIAGNOSIS — Z515 Encounter for palliative care: Secondary | ICD-10-CM | POA: Diagnosis not present

## 2015-03-17 DIAGNOSIS — F17210 Nicotine dependence, cigarettes, uncomplicated: Secondary | ICD-10-CM | POA: Diagnosis present

## 2015-03-17 DIAGNOSIS — Z681 Body mass index (BMI) 19 or less, adult: Secondary | ICD-10-CM | POA: Diagnosis not present

## 2015-03-17 DIAGNOSIS — L8990 Pressure ulcer of unspecified site, unspecified stage: Secondary | ICD-10-CM | POA: Diagnosis present

## 2015-03-17 DIAGNOSIS — T40605A Adverse effect of unspecified narcotics, initial encounter: Secondary | ICD-10-CM | POA: Diagnosis present

## 2015-03-17 DIAGNOSIS — Z807 Family history of other malignant neoplasms of lymphoid, hematopoietic and related tissues: Secondary | ICD-10-CM | POA: Diagnosis not present

## 2015-03-17 DIAGNOSIS — F1910 Other psychoactive substance abuse, uncomplicated: Secondary | ICD-10-CM | POA: Diagnosis present

## 2015-03-17 DIAGNOSIS — R64 Cachexia: Secondary | ICD-10-CM | POA: Diagnosis present

## 2015-03-17 DIAGNOSIS — Z79899 Other long term (current) drug therapy: Secondary | ICD-10-CM | POA: Diagnosis not present

## 2015-03-17 DIAGNOSIS — Z7289 Other problems related to lifestyle: Secondary | ICD-10-CM

## 2015-03-17 DIAGNOSIS — M549 Dorsalgia, unspecified: Secondary | ICD-10-CM | POA: Diagnosis present

## 2015-03-17 DIAGNOSIS — Z886 Allergy status to analgesic agent status: Secondary | ICD-10-CM | POA: Diagnosis not present

## 2015-03-17 DIAGNOSIS — Z66 Do not resuscitate: Secondary | ICD-10-CM | POA: Diagnosis present

## 2015-03-17 DIAGNOSIS — Z888 Allergy status to other drugs, medicaments and biological substances status: Secondary | ICD-10-CM

## 2015-03-17 DIAGNOSIS — E871 Hypo-osmolality and hyponatremia: Secondary | ICD-10-CM | POA: Diagnosis present

## 2015-03-17 DIAGNOSIS — M1990 Unspecified osteoarthritis, unspecified site: Secondary | ICD-10-CM | POA: Diagnosis present

## 2015-03-17 DIAGNOSIS — R0602 Shortness of breath: Secondary | ICD-10-CM

## 2015-03-17 DIAGNOSIS — T424X5A Adverse effect of benzodiazepines, initial encounter: Secondary | ICD-10-CM | POA: Diagnosis present

## 2015-03-17 DIAGNOSIS — Z7982 Long term (current) use of aspirin: Secondary | ICD-10-CM

## 2015-03-17 DIAGNOSIS — Z981 Arthrodesis status: Secondary | ICD-10-CM

## 2015-03-17 DIAGNOSIS — F329 Major depressive disorder, single episode, unspecified: Secondary | ICD-10-CM | POA: Diagnosis present

## 2015-03-17 DIAGNOSIS — R000 Tachycardia, unspecified: Secondary | ICD-10-CM

## 2015-03-17 DIAGNOSIS — J9621 Acute and chronic respiratory failure with hypoxia: Secondary | ICD-10-CM | POA: Diagnosis not present

## 2015-03-17 LAB — CBC WITH DIFFERENTIAL/PLATELET
BASOS ABS: 0 10*3/uL (ref 0–0.1)
BASOS PCT: 0 %
EOS ABS: 0 10*3/uL (ref 0–0.7)
Eosinophils Relative: 0 %
HEMATOCRIT: 43.9 % (ref 40.0–52.0)
Hemoglobin: 14.6 g/dL (ref 13.0–18.0)
Lymphocytes Relative: 8 %
Lymphs Abs: 0.5 10*3/uL — ABNORMAL LOW (ref 1.0–3.6)
MCH: 33.3 pg (ref 26.0–34.0)
MCHC: 33.2 g/dL (ref 32.0–36.0)
MCV: 100.3 fL — ABNORMAL HIGH (ref 80.0–100.0)
MONO ABS: 1.3 10*3/uL — AB (ref 0.2–1.0)
Monocytes Relative: 20 %
NEUTROS ABS: 4.8 10*3/uL (ref 1.4–6.5)
Neutrophils Relative %: 72 %
PLATELETS: 217 10*3/uL (ref 150–440)
RBC: 4.38 MIL/uL — ABNORMAL LOW (ref 4.40–5.90)
RDW: 14 % (ref 11.5–14.5)
WBC: 6.7 10*3/uL (ref 3.8–10.6)

## 2015-03-17 LAB — COMPREHENSIVE METABOLIC PANEL
ALK PHOS: 80 U/L (ref 38–126)
ALT: 24 U/L (ref 17–63)
AST: 26 U/L (ref 15–41)
Albumin: 4.1 g/dL (ref 3.5–5.0)
Anion gap: 6 (ref 5–15)
BILIRUBIN TOTAL: 0.9 mg/dL (ref 0.3–1.2)
BUN: 20 mg/dL (ref 6–20)
CALCIUM: 9.8 mg/dL (ref 8.9–10.3)
CO2: 36 mmol/L — ABNORMAL HIGH (ref 22–32)
CREATININE: 0.51 mg/dL — AB (ref 0.61–1.24)
Chloride: 89 mmol/L — ABNORMAL LOW (ref 101–111)
GFR calc Af Amer: >60 mL/min (ref >60)
GFR calc non Af Amer: >60 mL/min (ref >60)
Glucose, Bld: 116 mg/dL — ABNORMAL HIGH (ref 65–99)
Potassium: 4.8 mmol/L (ref 3.5–5.1)
SODIUM: 131 mmol/L — AB (ref 135–145)
TOTAL PROTEIN: 8 g/dL (ref 6.5–8.1)

## 2015-03-17 LAB — TROPONIN I

## 2015-03-17 LAB — BRAIN NATRIURETIC PEPTIDE: B NATRIURETIC PEPTIDE 5: 323 pg/mL — AB (ref 0.0–100.0)

## 2015-03-17 LAB — VALPROIC ACID LEVEL: Valproic Acid Lvl: <10 ug/mL — ABNORMAL LOW (ref 50.0–100.0)

## 2015-03-17 MED ORDER — IPRATROPIUM-ALBUTEROL 0.5-2.5 (3) MG/3ML IN SOLN: 3.0000 mL | RESPIRATORY_TRACT | Status: DC | PRN

## 2015-03-17 MED ORDER — CLINDAMYCIN PHOSPHATE 600 MG/50ML IV SOLN
600.0000 mg | Freq: Four times a day (QID) | INTRAVENOUS | Status: DC
  Administered 2015-03-18 – 2015-03-19 (×6): 600 mg via INTRAVENOUS
  Filled 2015-03-17 (×8): qty 50

## 2015-03-17 MED ORDER — LORAZEPAM 2 MG/ML IJ SOLN
1.0000 mg | Freq: Once | INTRAMUSCULAR | Status: AC
  Administered 2015-03-17: 1 mg via INTRAVENOUS
  Filled 2015-03-17: qty 1

## 2015-03-17 MED ORDER — SODIUM CHLORIDE 0.9 % IV SOLN
INTRAVENOUS | Status: DC
  Administered 2015-03-18 (×2): via INTRAVENOUS

## 2015-03-17 MED ORDER — SODIUM CHLORIDE 0.9 % IJ SOLN: 3.0000 mL | Freq: Two times a day (BID) | INTRAMUSCULAR | Status: DC

## 2015-03-17 MED ORDER — CLINDAMYCIN PHOSPHATE 600 MG/50ML IV SOLN
600.0000 mg | Freq: Once | INTRAVENOUS | Status: AC
  Administered 2015-03-17: 600 mg via INTRAVENOUS
  Filled 2015-03-17: qty 50

## 2015-03-17 MED ORDER — SODIUM CHLORIDE 0.9 % IV BOLUS (SEPSIS)
1000.0000 mL | Freq: Once | INTRAVENOUS | Status: AC
  Administered 2015-03-17: 1000 mL via INTRAVENOUS

## 2015-03-17 MED ORDER — HEPARIN SODIUM (PORCINE) 5000 UNIT/ML IJ SOLN
5000.0000 [IU] | Freq: Three times a day (TID) | INTRAMUSCULAR | Status: DC
  Administered 2015-03-18 (×4): 5000 [IU] via SUBCUTANEOUS
  Filled 2015-03-17 (×4): qty 1

## 2015-03-17 MED ORDER — ONDANSETRON HCL 4 MG PO TABS: 4.0000 mg | ORAL_TABLET | Freq: Four times a day (QID) | ORAL | Status: DC | PRN

## 2015-03-17 MED ORDER — ONDANSETRON HCL 4 MG/2ML IJ SOLN: 4.0000 mg | Freq: Four times a day (QID) | INTRAMUSCULAR | Status: DC | PRN

## 2015-03-17 MED ORDER — ACETAMINOPHEN 325 MG PO TABS: 650.0000 mg | ORAL_TABLET | Freq: Four times a day (QID) | ORAL | Status: DC | PRN

## 2015-03-17 MED ORDER — METHYLPREDNISOLONE SODIUM SUCC 125 MG IJ SOLR
60.0000 mg | Freq: Once | INTRAMUSCULAR | Status: AC
  Administered 2015-03-17: 60 mg via INTRAVENOUS
  Filled 2015-03-17: qty 2

## 2015-03-17 MED ORDER — MORPHINE SULFATE (PF) 2 MG/ML IV SOLN
2.0000 mg | INTRAVENOUS | Status: DC | PRN
  Administered 2015-03-18 – 2015-03-19 (×2): 2 mg via INTRAVENOUS
  Filled 2015-03-17 (×2): qty 1

## 2015-03-17 MED ORDER — IPRATROPIUM-ALBUTEROL 0.5-2.5 (3) MG/3ML IN SOLN
3.0000 mL | Freq: Once | RESPIRATORY_TRACT | Status: AC
  Administered 2015-03-17: 3 mL via RESPIRATORY_TRACT
  Filled 2015-03-17: qty 3

## 2015-03-17 MED ORDER — OXYCODONE HCL 5 MG PO TABS
5.0000 mg | ORAL_TABLET | ORAL | Status: DC | PRN
  Administered 2015-03-18: 5 mg via ORAL
  Filled 2015-03-17: qty 1

## 2015-03-17 MED ORDER — ACETAMINOPHEN 650 MG RE SUPP: 650.0000 mg | Freq: Four times a day (QID) | RECTAL | Status: DC | PRN

## 2015-03-17 NOTE — ED Notes (Signed)
Patient with significant oxygen desaturations when speaking. Sats drop to 79% on RA - supplemental oxygen started at 2L/[place] and titrated up to 4L/[place] to achieve a sat of 94%.
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
## 2015-03-17 NOTE — ED Notes (Signed)
Patient presents to ED via EMS from home. Patient reported to have had a seizure tonight. Family also reporting SOB when EMS arrived. Of note, patient out of his medications (Keppra, Xanax) and has been losing weight; ?? failure to thrive. EMS found patient in bed that was soiled with blood and feces; patient cleaned up prior to arrival.
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
## 2015-03-18 DIAGNOSIS — L8990 Pressure ulcer of unspecified site, unspecified stage: Secondary | ICD-10-CM | POA: Diagnosis present

## 2015-03-18 LAB — BASIC METABOLIC PANEL
ANION GAP: 7 (ref 5–15)
BUN: 15 mg/dL (ref 6–20)
CALCIUM: 9.3 mg/dL (ref 8.9–10.3)
CO2: 35 mmol/L — ABNORMAL HIGH (ref 22–32)
CREATININE: 0.36 mg/dL — AB (ref 0.61–1.24)
Chloride: 93 mmol/L — ABNORMAL LOW (ref 101–111)
Glucose, Bld: 106 mg/dL — ABNORMAL HIGH (ref 65–99)
Potassium: 5.1 mmol/L (ref 3.5–5.1)
SODIUM: 135 mmol/L (ref 135–145)

## 2015-03-18 LAB — URINALYSIS COMPLETE WITH MICROSCOPIC (ARMC ONLY)
BILIRUBIN URINE: NEGATIVE
GLUCOSE, UA: NEGATIVE mg/dL
KETONES UR: NEGATIVE mg/dL
NITRITE: NEGATIVE
Protein, ur: 30 mg/dL — AB
SPECIFIC GRAVITY, URINE: 1.023 (ref 1.005–1.030)
Squamous Epithelial / LPF: NONE SEEN
pH: 6 (ref 5.0–8.0)

## 2015-03-18 MED ORDER — CETYLPYRIDINIUM CHLORIDE 0.05 % MT LIQD
7.0000 mL | Freq: Two times a day (BID) | OROMUCOSAL | Status: DC
  Administered 2015-03-19: 7 mL via OROMUCOSAL

## 2015-03-18 MED ORDER — SODIUM CHLORIDE 0.9 % IV SOLN
1500.0000 mg | Freq: Two times a day (BID) | INTRAVENOUS | Status: DC
  Administered 2015-03-18 – 2015-03-20 (×4): 1500 mg via INTRAVENOUS
  Filled 2015-03-18 (×6): qty 15

## 2015-03-18 MED ORDER — LEVETIRACETAM 750 MG PO TABS
1500.0000 mg | ORAL_TABLET | Freq: Two times a day (BID) | ORAL | Status: DC
  Filled 2015-03-18: qty 2

## 2015-03-18 MED ORDER — LEVETIRACETAM 100 MG/ML PO SOLN
1500.0000 mg | Freq: Two times a day (BID) | ORAL | Status: DC
  Filled 2015-03-18 (×4): qty 15

## 2015-03-18 MED ORDER — CITALOPRAM HYDROBROMIDE 20 MG PO TABS: 20.0000 mg | ORAL_TABLET | Freq: Every day | ORAL | Status: DC

## 2015-03-18 MED ORDER — ALPRAZOLAM 0.25 MG PO TABS
0.2500 mg | ORAL_TABLET | Freq: Two times a day (BID) | ORAL | Status: DC
  Administered 2015-03-18: 09:00:00 0.25 mg via ORAL
  Filled 2015-03-18: qty 1

## 2015-03-18 MED ORDER — ASPIRIN EC 81 MG PO TBEC
81.0000 mg | DELAYED_RELEASE_TABLET | Freq: Every day | ORAL | Status: DC
  Administered 2015-03-18: 09:00:00 81 mg via ORAL
  Filled 2015-03-18: qty 1

## 2015-03-18 MED ORDER — VITAMIN B-12 1000 MCG PO TABS
1000.0000 ug | ORAL_TABLET | Freq: Every day | ORAL | Status: DC
  Administered 2015-03-18: 1000 ug via ORAL
  Filled 2015-03-18: qty 1

## 2015-03-18 MED ORDER — CHLORHEXIDINE GLUCONATE 0.12 % MT SOLN
15.0000 mL | Freq: Two times a day (BID) | OROMUCOSAL | Status: DC
  Administered 2015-03-18: 15 mL via OROMUCOSAL

## 2015-03-18 MED ORDER — NICOTINE 14 MG/24HR TD PT24
14.0000 mg | MEDICATED_PATCH | Freq: Every day | TRANSDERMAL | Status: DC
  Administered 2015-03-18 – 2015-03-19 (×2): 14 mg via TRANSDERMAL
  Filled 2015-03-18 (×2): qty 1

## 2015-03-18 MED ORDER — ENSURE COMPLETE PO LIQD: 237.0000 mL | Freq: Three times a day (TID) | ORAL | Status: DC

## 2015-03-18 MED ORDER — INFLUENZA VAC SPLIT QUAD 0.5 ML IM SUSY: 0.5000 mL | PREFILLED_SYRINGE | INTRAMUSCULAR | Status: DC

## 2015-03-18 MED ORDER — ENSURE ENLIVE PO LIQD: 237.0000 mL | Freq: Three times a day (TID) | ORAL | Status: DC

## 2015-03-18 MED ORDER — DIVALPROEX SODIUM ER 500 MG PO TB24
500.0000 mg | ORAL_TABLET | Freq: Two times a day (BID) | ORAL | Status: DC
  Administered 2015-03-18: 10:00:00 500 mg via ORAL
  Filled 2015-03-18 (×3): qty 1

## 2015-03-18 MED ORDER — MIRTAZAPINE 15 MG PO TABS: 7.5000 mg | ORAL_TABLET | Freq: Every day | ORAL | Status: DC
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
## 2015-03-18 NOTE — Progress Notes (Signed)
   03/18/15 1528  Clinical Encounter Type  Visited With Family  Visit Type Initial;Psychological support;Spiritual support  Referral From Nurse  Consult/Referral To Chaplain  Spiritual Encounters  Spiritual Needs Emotional  Son of patient requested to speak to a chaplain.  Expressed having a difficult time dealing with father's health issues.  No family support.  Son told me that his mother died last year and he is currently experiencing painful reminders now that father is in hospital.  Son told me that "he looks just like Mom did before she died." Son also said that he has not been able to get any information from the doctors or nursing staff about his father's condition.  I relayed this concern to patient's nurse and was told that the Dr has been called and is in there now speaking to the son.  Nursing staff relayed to me that they could not tell patient's son anything until the doctor had spoken to him.  Provided pastoral presence, compassion and emotional support.  Will have on call chaplain follow up tonight if possible.  [employer] Jermarion Poffenberger-pager [phone]

## 2015-03-18 NOTE — Progress Notes (Addendum)
Pt daughter, [name], and pt sister, [name], at bedside expressing concerns about pt continuing to not care for himself despite their continued efforts to make sure pt has all daily needs including medications. Both family members state "pt looks worse than they have ever seen him-- it is a significant decline." They both express frustration and exhaustion in trying to care for pt but pt continually refuses all services although they do not believe he can adequately care for him.
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
## 2015-03-18 NOTE — Plan of Care (Signed)
Problem: Discharge Progression Outcomes Goal: Other Discharge Outcomes/Goals Outcome: Not Progressing PRN morphine and oxycodone given for pain back and generalized with good relief VSS Speech saw patient and he was made NPO as he was unable to to manage swallowing and was too lethargic  Patient continues on IV fluids and IV ABX

## 2015-03-18 NOTE — Progress Notes (Signed)
[facility] - [place] [facility]   PATIENT NAME: Ronald [name]    MR#:  [id number]  DATE OF BIRTH:  08-01-1955  SUBJECTIVE:  CHIEF COMPLAINT:   Chief Complaint  Patient presents with  . Seizures  . Shortness of Breath  . Failure To Thrive  reports losing 60 lbs wt loss over last few month, just don't want to eat. Couldn't take keppra pills per nurse. REVIEW OF SYSTEMS:  Review of Systems  Constitutional: Positive for weight loss. Negative for fever, malaise/fatigue and diaphoresis.  HENT: Negative for ear discharge, ear pain, hearing loss, nosebleeds, sore throat and tinnitus.   Eyes: Negative for blurred vision and pain.  Respiratory: Negative for cough, hemoptysis, shortness of breath and wheezing.   Cardiovascular: Negative for chest pain, palpitations, orthopnea and leg swelling.  Gastrointestinal: Negative for heartburn, nausea, vomiting, abdominal pain, diarrhea, constipation and blood in stool.  Genitourinary: Negative for dysuria, urgency and frequency.  Musculoskeletal: Negative for myalgias and back pain.  Skin: Negative for itching and rash.  Neurological: Positive for seizures. Negative for dizziness, tingling, tremors, focal weakness, weakness and headaches.  Psychiatric/Behavioral: Negative for depression. The patient is not nervous/anxious.    DRUG ALLERGIES:   Allergies  Allergen Reactions  . Antihistamines, Diphenhydramine-Type Other (See Comments)    Speeds up heart rate.  . Codeine Nausea And Vomiting   VITALS:  Blood pressure 101/67, pulse 101, temperature 99 F (37.2 C), temperature source Oral, resp. rate 20, height  (1.88 m), weight 48.535 kg (107 lb), SpO2 96 %. PHYSICAL EXAMINATION:  Physical Exam  Constitutional: He is oriented to person, place, and time. He appears malnourished and dehydrated. He appears unhealthy. He appears cachectic.  HENT:  Head: Normocephalic and atraumatic.  Eyes: Conjunctivae and EOM are  normal. Pupils are equal, round, and reactive to light.  Neck: Normal range of motion. Neck supple. No tracheal deviation present. No thyromegaly present.  Cardiovascular: Normal rate, regular rhythm and normal heart sounds.   Pulmonary/Chest: Accessory muscle usage present. Tachypnea noted. He is in respiratory distress. He has decreased breath sounds. He has no wheezes. He has rhonchi. He exhibits no tenderness.  Abdominal: Soft. Bowel sounds are normal. He exhibits no distension. There is no tenderness.  Musculoskeletal: Normal range of motion.  Neurological: He is alert and oriented to person, place, and time. No cranial nerve deficit.  Skin: Skin is warm and dry. No rash noted.  Psychiatric: Mood and affect normal.   LABORATORY PANEL:   CBC  Recent Labs Lab April 02, 2015 2208  WBC 6.7  HGB 14.6  HCT 43.9  PLT 217   ------------------------------------------------------------------------------------------------------------------ Chemistries   Recent Labs Lab 04-02-2015 2208 03/18/15 0443  NA 131* 135  K 4.8 5.1  CL 89* 93*  CO2 36* 35*  GLUCOSE 116* 106*  BUN 20 15  CREATININE 0.51* 0.36*  CALCIUM 9.8 9.3  AST 26  --   ALT 24  --   ALKPHOS 80  --   BILITOT 0.9  --    RADIOLOGY:  Dg Chest Port 1 View  04-02-15   CLINICAL DATA:  Shortness of breath. History of seizure this evening.  EXAM: PORTABLE CHEST 1 VIEW  COMPARISON:  02/21/2015; 02/18/2015  FINDINGS: Grossly unchanged cardiac silhouette and mediastinal contours. The lungs remain hyperexpanded with flattening of the diaphragms and blunting of bilateral costophrenic angles interval development of ill-defined nodular interstitial opacities within the peripheral aspect of the bilateral upper/mid lungs. No definite pleural effusion pneumothorax. No  evidence of edema. No acute osseous abnormalities.  IMPRESSION: 1. Interval development of ill-defined nodular opacities within the peripheral aspects of the bilateral upper  lungs, nonspecific though given history of recent seizure could be indicative of aspiration though note, atypical infection could have a similar appearance. A follow-up chest radiograph in 3 to 4 weeks after treatment is recommended to ensure resolution. 2. Otherwise, similar findings of lung hyperexpansion.   Electronically Signed   By: Simonne Come M.D.   On: 08-Apr-2015 21:07   ASSESSMENT AND PLAN:  59 year old Caucasian gentleman history of seizure disorder, COPD presenting with shortness of breath and seizure  1. Acute on chronic hypoxic respiratory failure: could be due to aspiration pneunmonia, continue abx at this time. 2. Seizure: Known seizure disorder supposedly out of medications because "critters stole them" received Ativan in emergency department, place on seizure precautions, restarted home medications - change keppra to liquid form 3. Hyponatremia: continue IV normal saline follow sodium levels 4. Venous thrombus embolism prophylactic: Heparin subcutaneous     All the records are reviewed and case discussed with Care Management/Social Worker. Management plans discussed with the patient, family and they are in agreement.  CODE STATUS: Full Code  TOTAL TIME (Critical Care) TAKING CARE OF THIS PATIENT: 35 minutes.   More than 50% of the time was spent in counseling/coordination of care: YES (had long d/w son)  POSSIBLE D/C IN 1-2 DAYS, DEPENDING ON CLINICAL CONDITION.   [facility], Kamya Watling M.D on 03/18/2015 at 4:55 PM  Between 7am to 6pm - Pager - [phone]  After 6pm go to [email or web address] - password [email or web address] [facility]  [facility]  Office  [phone]  CC:  Primary care physician; Aida Puffer, MD
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
## 2015-03-18 NOTE — Plan of Care (Signed)
Problem: Discharge Progression Outcomes Goal: Pain controlled with appropriate interventions Outcome: Completed/Met Date Met:  03/18/15 No signs or symptoms of discomfort. Goal: Hemodynamically stable Outcome: Progressing Current vital signs stable. Dr Lavetta Nielsen notified. Order obtained for prn oxygen supplementation.  Goal: Complications resolved/controlled Outcome: Not Progressing Pt newly admitted this am. Very lethargic and frail.  Goal: Tolerating diet Outcome: Not Progressing Malnourished. Weight was 107 lbs upon admission.  Goal: Activity appropriate for discharge plan Outcome: Not Progressing Currently patient lethargic and immobile in bed

## 2015-03-18 NOTE — Care Management (Signed)
Admitted to [facility] with the diagnosis of acute on chronic respiratory failure. Lives alone. Sister is [name] [phone]). Seen multiple physicians in the past, Dr Clarene Duke, Dr. Everlena Cooper (neurology), Dr. Sharen Hones [facility])., and [employer] [employer].   Multiple presentations at [facility], [place], and [address]. History of seizure activity and falls. No home oxygen. [facility] in the past. Lethargic unable to talk to at this time. Gwenette Greet RN MSN Care Management [phone]

## 2015-03-18 NOTE — Care Management Important Message (Signed)
Important Message  Patient Details  Name: TREVINO WYATT MRN: [id number] Date of Birth: [birth year]   Medicare Important Message Given:  Yes-second notification given    Gwenette Greet, RN 03/18/2015, 8:23 AM

## 2015-03-19 DIAGNOSIS — J9620 Acute and chronic respiratory failure, unspecified whether with hypoxia or hypercapnia: Secondary | ICD-10-CM

## 2015-03-19 DIAGNOSIS — J449 Chronic obstructive pulmonary disease, unspecified: Secondary | ICD-10-CM

## 2015-03-19 DIAGNOSIS — Z515 Encounter for palliative care: Secondary | ICD-10-CM

## 2015-03-19 MED ORDER — ACETAMINOPHEN 650 MG RE SUPP: 650.0000 mg | Freq: Four times a day (QID) | RECTAL | Status: AC | PRN

## 2015-03-19 MED ORDER — MORPHINE SULFATE (CONCENTRATE) 10 MG /0.5 ML PO SOLN: 2.6000 mg | ORAL | Status: AC | PRN

## 2015-03-19 MED ORDER — BISACODYL 10 MG RE SUPP: 10.0000 mg | Freq: Every day | RECTAL | Status: DC | PRN

## 2015-03-19 MED ORDER — MORPHINE SULFATE (PF) 4 MG/ML IV SOLN
4.0000 mg | INTRAVENOUS | Status: DC | PRN
  Filled 2015-03-19: qty 1

## 2015-03-19 MED ORDER — BISACODYL 10 MG RE SUPP: 10.0000 mg | Freq: Every day | RECTAL | Status: AC | PRN

## 2015-03-19 MED ORDER — GLYCOPYRROLATE 0.2 MG/ML IJ SOLN
0.4000 mg | Freq: Three times a day (TID) | INTRAMUSCULAR | Status: DC | PRN
  Filled 2015-03-19: qty 2

## 2015-03-19 MED ORDER — MORPHINE SULFATE (PF) 2 MG/ML IV SOLN: 2.0000 mg | INTRAVENOUS | Status: DC | PRN

## 2015-03-19 MED ORDER — LORAZEPAM 2 MG/ML IJ SOLN
0.5000 mg | INTRAMUSCULAR | Status: DC | PRN
  Filled 2015-03-19: qty 1

## 2015-03-19 MED ORDER — LORAZEPAM 2 MG/ML IJ SOLN
0.5000 mg | Freq: Two times a day (BID) | INTRAMUSCULAR | Status: DC
  Administered 2015-03-19 – 2015-03-20 (×3): 0.5 mg via INTRAVENOUS
  Filled 2015-03-19 (×2): qty 1

## 2015-03-19 MED ORDER — LORAZEPAM 0.5 MG PO TABS: 0.5000 mg | ORAL_TABLET | Freq: Three times a day (TID) | ORAL | Status: AC

## 2015-03-19 MED ORDER — PROCHLORPERAZINE 25 MG RE SUPP: 25.0000 mg | Freq: Two times a day (BID) | RECTAL | Status: AC | PRN

## 2015-03-19 MED ORDER — MORPHINE SULFATE (CONCENTRATE) 10 MG /0.5 ML PO SOLN: 5.0000 mg | ORAL | Status: AC | PRN

## 2015-03-19 MED ORDER — MORPHINE SULFATE (PF) 2 MG/ML IV SOLN
2.0000 mg | INTRAVENOUS | Status: DC | PRN
  Administered 2015-03-19 – 2015-03-20 (×2): 2 mg via INTRAVENOUS
  Filled 2015-03-19 (×2): qty 1

## 2015-03-19 MED ORDER — GLYCOPYRROLATE 1 MG PO TABS: 1.0000 mg | ORAL_TABLET | Freq: Three times a day (TID) | ORAL | Status: AC | PRN

## 2015-03-19 MED ORDER — MORPHINE SULFATE (PF) 2 MG/ML IV SOLN: 1.0000 mg | INTRAVENOUS | Status: DC | PRN

## 2015-03-19 MED ORDER — LORAZEPAM 0.5 MG PO TABS: 0.5000 mg | ORAL_TABLET | ORAL | Status: AC | PRN

## 2015-03-19 NOTE — Progress Notes (Signed)
Palliative Care Update  The pt's son was reached and came here with his sister. The two of them are in agreement with local Hospice Home placement on Saturday if such can be arranged.  The son is grieving deeply because he is extremely close to his father.  The other relatives are supportive and grieving as well.    They lost their mother some time ago to Lung Cancer and she died in Hospice Home in [place], [place]. She stayed there for about 2 1/2 mos --but they know their father will not live that long there.  I believe that pts primary disease process is advanced/ end stage COPD plus acute pneumonia.    Family reports that he only wanted to smoke cigarettes and take his Percocets and Xanax tabs lately and would not eat or even try to eat.  He seemed to only think family was good for getting him his RXs from the drug store or bringing him cigarettes which they stopped doing.  Pt has been smoking 1.5 packs of cigarettes per day recently.  He did regain some wt while in the rehab facility --but he checked himself out of there and went right back to his former doctor and got back on his prior drugs (percocets and benzos) --and family says its been all downhill since then.  Pt has started acting erratically recently and they realize he may not have been getting adequate oxygen to his brain (or else might have been retaining CO2) for all this time without them realizing this was affecting his mental processing abilities.    He has very likely had anorexia associated with severe COPD and this combined with the anorexia associated with chronic drug abuse (percocets and valiums and then percocets and xanaxs), created a total loss of appetite and profuond wt loss.  Dysphagia may be present, but even if he could swallow, he doesn't want to put food or liquid into his mouth at all, per his adult children.  They thought it was his wilfullness, but I explained how cigarettes and COPD can take away appetite and how  this can result in calories being burned to just breath instead of maintaining and building muscle. Pt might have an underlying cancer or other process, but it won't change the outcome to learn more about his lungs etc ---and family agrees that more testing would likely only lead to more aggressive interventions that won't help him at this point.   Family understands that pt will give mixed answers over time to the same questions and that they therefore need to make the decisions. They have opted for Hospice Home. After considering the pros and cons of various locations of Hospice Homes (since family is scattered living in different cities near [place]), they chose the [place] location as being most practical.    Much was discussed.  Much will be discussed going forward.   Will see if Hospice Home consult can be arranged for Saturday.  I have spent 4 hours in calling various family members and counseling son and daughter today --and coordinating care today for pts plan of treatment going forward from this point.   I have done the DC medication reconciliation orders.  The attending will need to complete the DC summary.    Suan Halter, MD

## 2015-03-19 NOTE — Progress Notes (Signed)
   03/19/15 1010  Clinical Encounter Type  Visited With Patient not available  Visit Type Follow-up  Attempted to visit with patient or family, but no family present at time of my visit and patient was sleeping. Will try to visit later today if possible. [employer] Ivyrose Hashman-pager [phone]

## 2015-03-19 NOTE — Care Management Important Message (Signed)
Important Message  Patient Details  Name: Ronald [name] MRN: [id number] Date of Birth: [birth year]   Medicare Important Message Given:  Yes-third notification given    Gwenette Greet, RN 03/19/2015, 1:22 PM

## 2015-03-19 NOTE — Plan of Care (Signed)
Problem: Discharge Progression Outcomes Goal: Other Discharge Outcomes/Goals Outcome: Not Progressing Patient was mostly non responsive until mid afternoon At which point he became less lethargic and more verbal  Speech saw patient and placed him on a Dysphagia I diet but patient had no appetite  Patient c/o back pain x1 late in shift relieved by prn morphine 2 mg Family met with Dr Megan Salon and they agreed to Hospice placement tomorrow if available  Patient continues on NS KVO and IV ABX

## 2015-03-19 NOTE — Progress Notes (Signed)
[facility] - [place] at [facility]   PATIENT NAME: Ronald [name]    MR#:  [id number]  DATE OF BIRTH:  1956-03-07  SUBJECTIVE:  CHIEF COMPLAINT:   Chief Complaint  Patient presents with  . Seizures  . Shortness of Breath  . Failure To Thrive  he's barely arousable to loud verbal commands. Falls back sleep easily  REVIEW OF SYSTEMS:  Review of Systems  Constitutional: Positive for weight loss. Negative for fever, malaise/fatigue and diaphoresis.  HENT: Negative for ear discharge, ear pain, hearing loss, nosebleeds, sore throat and tinnitus.   Eyes: Negative for blurred vision and pain.  Respiratory: Negative for cough, hemoptysis, shortness of breath and wheezing.   Cardiovascular: Negative for chest pain, palpitations, orthopnea and leg swelling.  Gastrointestinal: Negative for heartburn, nausea, vomiting, abdominal pain, diarrhea, constipation and blood in stool.  Genitourinary: Negative for dysuria, urgency and frequency.  Musculoskeletal: Negative for myalgias and back pain.  Skin: Negative for itching and rash.  Neurological: Positive for seizures. Negative for dizziness, tingling, tremors, focal weakness, weakness and headaches.  Psychiatric/Behavioral: Negative for depression. The patient is not nervous/anxious.    DRUG ALLERGIES:   Allergies  Allergen Reactions  . Antihistamines, Diphenhydramine-Type Other (See Comments)    Speeds up heart rate.  . Codeine Nausea And Vomiting   VITALS:  Blood pressure 92/59, pulse 94, temperature 97.5 F (36.4 C), temperature source Oral, resp. rate 20, height  (1.88 m), weight 48.535 kg (107 lb), SpO2 100 %. PHYSICAL EXAMINATION:  Physical Exam  Constitutional: He appears malnourished and dehydrated. He appears unhealthy. He appears cachectic.  HENT:  Head: Normocephalic and atraumatic.  Eyes: Conjunctivae and EOM are normal. Pupils are equal, round, and reactive to light.  Neck: Normal range of  motion. Neck supple. No tracheal deviation present. No thyromegaly present.  Cardiovascular: Normal rate, regular rhythm and normal heart sounds.   Pulmonary/Chest: Accessory muscle usage present. Tachypnea noted. He is in respiratory distress. He has decreased breath sounds. He has no wheezes. He has rhonchi. He exhibits no tenderness.  Abdominal: Soft. Bowel sounds are normal. He exhibits no distension. There is no tenderness.  Musculoskeletal: Normal range of motion.  Neurological: No cranial nerve deficit.  He is very lethargic and barely arousable to loud verbal stimuli  Skin: Skin is warm and dry. No rash noted.  Psychiatric:  Difficult to evaluate as He is very lethargic and barely arousable to loud verbal stimuli   LABORATORY PANEL:   CBC  Recent Labs Lab 03/24/2015 2208  WBC 6.7  HGB 14.6  HCT 43.9  PLT 217   ------------------------------------------------------------------------------------------------------------------ Chemistries   Recent Labs Lab 04/07/2015 2208 03/18/15 0443  NA 131* 135  K 4.8 5.1  CL 89* 93*  CO2 36* 35*  GLUCOSE 116* 106*  BUN 20 15  CREATININE 0.51* 0.36*  CALCIUM 9.8 9.3  AST 26  --   ALT 24  --   ALKPHOS 80  --   BILITOT 0.9  --    RADIOLOGY:  No results found. ASSESSMENT AND PLAN:  59 year old Caucasian gentleman history of seizure disorder, COPD presenting with shortness of breath and seizure  1. Acute on chronic hypoxic respiratory failure: could be due to aspiration pneunmonia, Clindamycin day 2, aspiration precaution  2. Seizure: Known seizure disorder supposedly out of medications/ noncompliance. Continue seizure precautions, restarted home medications - change keppra to liquid form. he claimed he could not take tablet form  3. Hyponatremia: resolved with IV hydration  4.  Failure to thrive/weight loss/possible depression: Pt daughter, Ronald [name], and pt sister, Ronald [name], and also his son - expressing concerns about pt  continuing to not care for himself despite their continued efforts to make sure pt has all daily needs including medications. Per family - this is a significant decline from his baseline - likely over time. They express frustration and exhaustion in trying to care for pt but pt continually refuses all services although they do not believe he can adequately care for him.  His sister Ronald [name].  He may be affected from his house as well as rental has not been paid for few months.  5. Acute metabolic encephalopathy: be due to benzodiazepine and narcotics. Will stop morphine and xanax at this time.  6. Depression: on celexa and remeron.  *Venous thrombus embolism prophylactic: Heparin subcutaneous     All the records are reviewed and case discussed with Care Management/Social Worker. Management plans discussed with the patient, family and they are in agreement.  CODE STATUS: Full Code  TOTAL TIME TAKING CARE OF THIS PATIENT: 35 minutes.   More than 50% of the time was spent in counseling/coordination of care: YES (had long d/w son yesterday ,had a long discussion with patient's sister today as I was unable to which the patient's son)  None of them have healthcare power of attorney for patient and they are going to discuss among themselves to decide code status. Palliative care following.   POSSIBLE D/C IN 1-2 DAYS, DEPENDING ON CLINICAL CONDITION.   [facility], Fate Galanti M.D on 03/19/2015 at 1:08 PM  Between 7am to 6pm - Pager - [phone]  After 6pm go to [email or web address] - password [email or web address] [facility]  [place]  Office  [phone]  CC:  Primary care physician; Aida Puffer, MD

## 2015-03-19 NOTE — Plan of Care (Addendum)
Problem: Discharge Progression Outcomes Goal: Other Discharge Outcomes/Goals Outcome: Progressing Plan of care progress to goals: 1. C/o abdominal pain relieved by repositioning throughout the night w/ noted relief.  2. Hemodynamically:   -VSS, afebrile  -IVF infusing & IV Abx given as ordered   -remains lethargic, speech incomprehensible at times 3. NPO, speech to re-evaluate pt today 4. High fall risk, pt remains in bed throughout shift. Very fragile and weak. Pt understands how to use call system for assistance.

## 2015-03-19 NOTE — Progress Notes (Signed)
Speech Language Pathology Treatment: Dysphagia  Patient Details Name: Ronald [name] MRN: [id number] DOB: 11-17-1955 Today's Date: 03/19/2015 Time: 1430-1520 SLP Time Calculation (min) (ACUTE ONLY): 50 min  Assessment / Plan / Recommendation Clinical Impression  Pt awakened this PM and w/ encouragement took a few sips of thin liquids and 2 trials of puree. No immediate, overt s/s of aspiration noted; there was a delayed throat clearing post multiple sips of thin liquids noted to occur x1. Unsure if related to po's, but pt does present w/ increased risk for aspiration sec. to his declined Cognitive status and overall weakness. Noted slight increase in pt's respiratory effort post drinking multiple sips. He is dependent on being fed at this time and requires verbal cues for follow-through w/ task and encouragement to take po's in general. MD consulted (Palliative Care) and agreed w/ initiation of oral diet w/ strict aspiration precautions; meds in Puree and assistance at meals. Rec. Dietician f/u for nutritional supplements as pt does not appear interested in po's at this time. Rec. Giving po's only when pt is fully awake/alert to participate as he is requesting pain meds which could make him drowsy. ST will f/u w/ toleration of diet and pt's status next 1-3 days. (Palliative Care MD has broached the option of Hospice w/ pt.)   HPI Other Pertinent Information: Pt is a 59 y.o. male with a known history of COPD, chronic low back pain, depression/ansiety, ETOH and tobacco abuse, and non-oxygen dependent history of seizure disorder as well as benzodiazepine usage presenting after a seizure. Pt has had multiple visits to the ED in the [facility] seeking medications for seizures and pain. Patient unable to provide information given mental status/medical condition. Per emergency department staff he was sent and a markedly disheveled condition essentially covered in his own feces. He was noted to have a  seizure at home thus brought to the hospital. Upon arrival noted to be desaturating down to 78% on room air. He told the emergency department staff that his family members have been stealing his benzodiazepine's study started to hide them outside of and then they were "taken by some critters". Pt is currently awake and answers to some questions inconsistently; earlier hea had been lethargic/sleepy only arousing briefly to mod-max. verbal and tactile stim like yesterday. Pt responded to verbal cues as he participated in the eval. It has been determined that pt has lost ~60lbs of weight in the last 6 months, however, no reports of dysphagia have been documented the recent contacts that the pt has made w/ the EDs in the [facility] system. Pt has been NPO since yesterday but not asking for foods per report.   Pertinent Vitals Pain Assessment:  (pt stated he "had pain"; NSG giving morphine per MD)  SLP Plan  Goals updated    Recommendations Diet recommendations: Dysphagia 1 (puree);Thin liquid Liquids provided via: Cup;Straw Medication Administration: Crushed with puree Supervision: Staff to assist with self feeding;Full supervision/cueing for compensatory strategies Compensations: Minimize environmental distractions;Slow rate;Small sips/bites (encouragement) Postural Changes and/or Swallow Maneuvers: Seated upright 90 degrees              General recommendations:  (Dietician) Oral Care Recommendations: Oral care QID;Staff/trained caregiver to provide oral care Follow up Recommendations:  (TBD; palliative care ongoing) Plan: Goals updated    GO    Jerilynn Som, MS, CCC-SLP  Watson,Katherine 03/19/2015, 3:39 PM

## 2015-03-19 NOTE — Plan of Care (Signed)
Problem: SLP Dysphagia Goals Goal: Misc Dysphagia Goal Pt will safely tolerate po diet of least restrictive consistency w/ no overt s/s of aspiration noted by Staff/pt/family x3 sessions.    

## 2015-03-19 NOTE — Clinical Documentation Improvement (Signed)
Internal Medicine  Please clarify nutritional status in progress notes and discharge summary:    Document Severity - Severe(third degree), Moderate (second degree), Mild (first degree)  Form - [place] (rarely seen in the [place]asm[place], Other Condition, Unable to Determine  Other condition  Unable to clinically determine  Document any associated diagnoses/conditions   Supporting Information: :   History of Severe Protein Calorie Malnutrition Per H&P: Chief Complaint  Patient presents with  . Seizures  . Shortness of Breath  . Failure To Thrive       10/6 progress note: Physical Exam  Constitutional: He is oriented to person, place, and time. He appears malnourished and dehydrated. He appears unhealthy. He appears cachectic.   RD initial nutrition assessment 10/6  Severe malnutrition in context of chronic illness NUTRITION DIAGNOSIS:  Malnutrition related to chronic illness as evidenced by percent weight loss, energy intake < or equal to 75% for > or equal to 1 month, severe depletion of muscle mass, severe depletion of body fat. Current Nutrition: Pt reports not wanting to eat this morning, awaiting Ensure Food/Nutrition-Related History: Pt reports poor appetite for the past month eating much less than usual. Pt reports liking Ensure and sometimes drinking that when able. Pt c/o difficulty swallowing 'for awhile.' Weight Change: Pt reports 60lbs weight loss for the past month, per [facility] 12lbs weight loss in the past 2 months (10% weight loss in 2 months)  Height:  6'2"  Weight 107lbs  BMI:  13.73  INTERVENTION:   Meals and Snacks: Cater to patient preferences, will follow SLP recommendations.  Medical Food Supplement Therapy: pt ordered Ensure Enlive po TID, each supplement provides 350 kcal and 20 grams of protein. Will also order Magic Cup BID as well as pt reports liking ice cream.   Please exercise your independent, professional judgment when  responding. A specific answer is not anticipated or expected.   Thank You, Harless Litten Health Information Management  [phone]

## 2015-03-22 LAB — CULTURE, BLOOD (ROUTINE X 2)
CULTURE: NO GROWTH
CULTURE: NO GROWTH

## 2015-04-13 NOTE — Plan of Care (Signed)
Problem: Discharge Progression Outcomes Goal: Other Discharge Outcomes/Goals Outcome: Not Progressing Patient is a DNR,  continues slow decline per report and was to be transferred to Hospice  Patient had no c/o pain, was slightly restless in am but showed no signs of discomfort  IV fluids infusing and IV Keppra given as scheduled MD notified concerning patient decline Patients family (sister was notified) sister said she was calling brother to let home know and they were coming by  Patient was pronounced deceased at 17:20 by Dr Hoyle Sauer, nursing supervisor notified

## 2015-04-13 NOTE — Progress Notes (Signed)
Patient is DO NOT RESUSCITATE and passed away. RN has called for pronouncement  During my examination patient is not arousable to verbal commands or sternal rub, pupils are not reacting to light ,with no breathing movements , no cardiac sounds pulses not palpable.. Patient was pronounced dead at 5:20 PM Family was notified by the RN
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
## 2015-04-13 NOTE — Care Management Note (Signed)
Case Management Note  Patient Details  Name: Ronald [name] MRN: [id number] Date of Birth: [birth year]  Subjective/Objective:    [facility] but family has not gone to the facility as requested to sign Hospice conset paperwork. Laurin at [facility] is awaiting the arrival of Ronald [name] family before she can officially accept Ronald [name].           Action/Plan:   Expected Discharge Date:                  Expected Discharge Plan:     In-House Referral:     Discharge planning Services     Post Acute Care Choice:    Choice offered to:     DME Arranged:    DME Agency:     HH Arranged:    HH Agency:     Status of Service:     Medicare Important Message Given:  Yes-third notification given Date Medicare IM Given:    Medicare IM give by:    Date Additional Medicare IM Given:    Additional Medicare Important Message give by:     If discussed at Long Length of Stay Meetings, dates discussed:    Additional Comments:  Gretna Bergin A, RN 04/12/2015, 2:03 PM

## 2015-04-13 NOTE — Progress Notes (Signed)
Dr Hoyle Sauer notified on patient decline as per Dr Eddie North request, she is coming up to see patient

## 2015-04-13 NOTE — Progress Notes (Signed)
Clinical Child psychotherapist (CSW) contacted patient's son Vencent to see if he had found a ride to the hospice home to sign consent forms. Per son he has not been able to get in touch with any family. CSW contacted patient's sister [name]. Per [name] they will figure out how to get the son to the hospice home to sign consents. Per [name] she does not feel comfortable signing the consent forms and wants his children to do it. [name] told CSW to call patient's daughter [name]. CSW attempted to call patient's daughter however she did not answer and her voicemail was full. CSW called patient's son back and told him [name] answered the phone. Per son he will call [name] again for a ride. CSW will continue to follow and assist as needed.   Jetta Lout, LCSWA [phone]

## 2015-04-13 NOTE — Care Management Note (Signed)
Case Management Note  Patient Details  Name: Ronald [name] MRN: [id number] Date of Birth: [birth year]  Subjective/Objective:      Dr Lovina Reach note, Discharge order to Hospice facility, H&P, Discharge Summary, Demographics sheet, all faxed to [facility].               Action/Plan:   Expected Discharge Date:                  Expected Discharge Plan:     In-House Referral:     Discharge planning Services     Post Acute Care Choice:    Choice offered to:     DME Arranged:    DME Agency:     HH Arranged:    HH Agency:     Status of Service:     Medicare Important Message Given:  Yes-third notification given Date Medicare IM Given:    Medicare IM give by:    Date Additional Medicare IM Given:    Additional Medicare Important Message give by:     If discussed at Long Length of Stay Meetings, dates discussed:    Additional Comments:  Kollyn Lingafelter A, RN 03/19/2015, 9:59 AM

## 2015-04-13 NOTE — Progress Notes (Signed)
Clinical Social Worker (CSW) contacted patient's son Geneva to see if he made progress with getting a ride. Per son his friend gets off at 4 pm and will take him to the hospice home today to complete paper work. CSW contacted Lauren at [facility] and made her aware of above. Per Lauren she will call CSW when son arrives. If son arrives after 4:30 then Leotis Shames will call 1 C nurses station. CSW will continue to follow and assist as needed.   Jetta Lout, LCSWA [phone]

## 2015-04-13 NOTE — Plan of Care (Signed)
Problem: Discharge Progression Outcomes Goal: Other Discharge Outcomes/Goals Plan of care progress to goal: VSS-planning discharge to hospice home today-lethargic most of shift-incontinent of urine

## 2015-04-13 NOTE — Progress Notes (Signed)
Called Dr Elpidio Anis and notified him of patient decline and he stated to call Hospitalist on call (Dr Hoyle Sauer)

## 2015-04-13 NOTE — Progress Notes (Signed)
Speech Therapy Note: reviewed chart notes; consulted NSG. Pt has not eaten but a bite or two and a sip of liquid it seems since yesterday when diet order was placed. Pt continually reports "pain" and requests medications. Family has met w/ Palliative Care MD and agreed upon Hospice Home discharge for end of life care for pt. ST will be available for any further education as needed. Continue to rec. Aspiration precautions. NSG updated and agreed.

## 2015-04-13 NOTE — Progress Notes (Signed)
Plan is for patient to D/C to [place]/ [facility]. Per [facility] RN patient can transfer today to hospice home once family signs consent forms. Clinical Child psychotherapist (CSW) prepared D/C packet including DNR and faxed D/C Summary and facesheet to [employer]. CSW contacted patient's son Darl. Per son he will call his aunt and they will go to the hospice facility today to sign paper work. CSW is waiting on call from Lauren at [facility] to give approval to call for EMS transport. CSW will continue to follow and assist as needed.   Jetta Lout, LCSWA [phone]

## 2015-04-13 DEATH — deceased

## 2015-04-30 ENCOUNTER — Ambulatory Visit: Payer: Self-pay | Admitting: Neurology

## 2016-07-06 IMAGING — CT CT CERVICAL SPINE W/O CM
4 of 6 series · 13 of 35 positions shown, 15 images · non-contrast
Comparison: CT head 02/21/2015, CT cervical spine 12/07/2014

CLINICAL DATA: Multiple on witnessed seizures yesterday, neck pain
[DATE], generalized body aches, was at a baseball game and went numb
all over, fell to ground due to seizures, smoker, COPD

EXAM:
CT HEAD WITHOUT CONTRAST
CT CERVICAL SPINE WITHOUT CONTRAST
TECHNIQUE: Multidetector CT imaging of the head and cervical spine was
performed following the standard protocol without intravenous
contrast. Multiplanar CT image reconstructions of the cervical spine
were also generated.

[Series 6: c_spine 2.0 st · axial · 0.44mm/px · z∈[-206,-132]mm · 2 of 111 slices shown]
[im 37/111  bone]
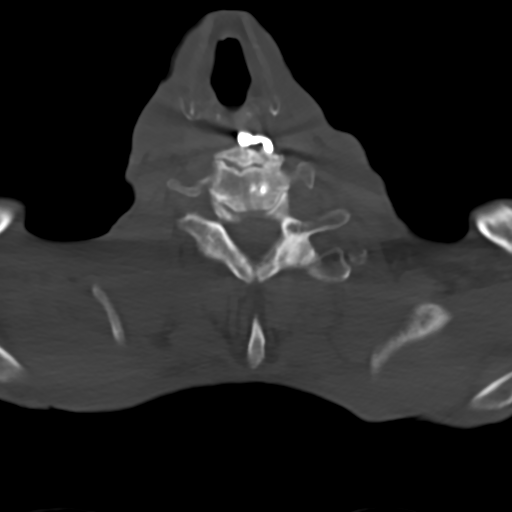
[im 74/111  bone]
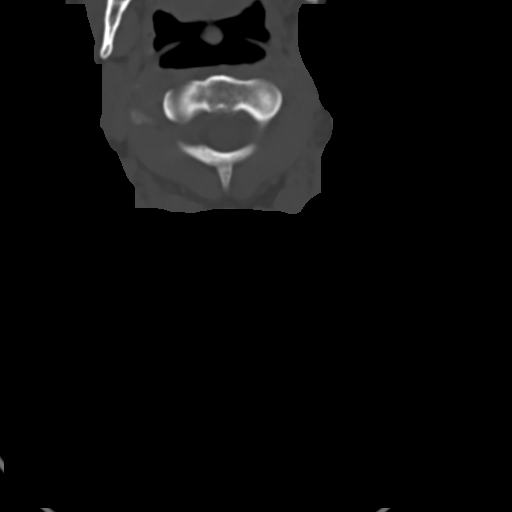

[Series 10: c_spine 2.0 sag bone · sagittal · 0.46mm/px · 5 of 63 slices shown, 6 images (1 of 2)]
[im 21/63  bone]
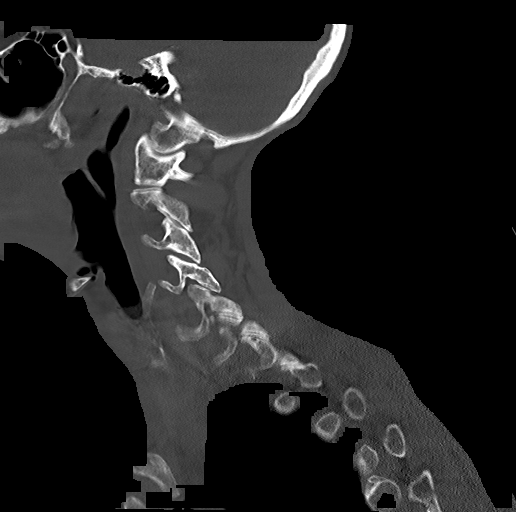
[im 26/63  bone]
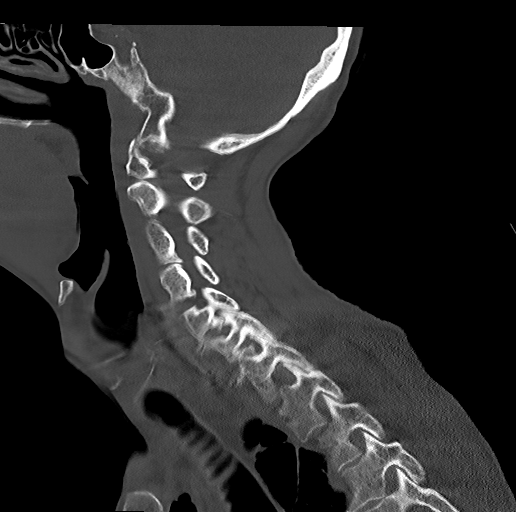
[im 32/63  soft-tissue]
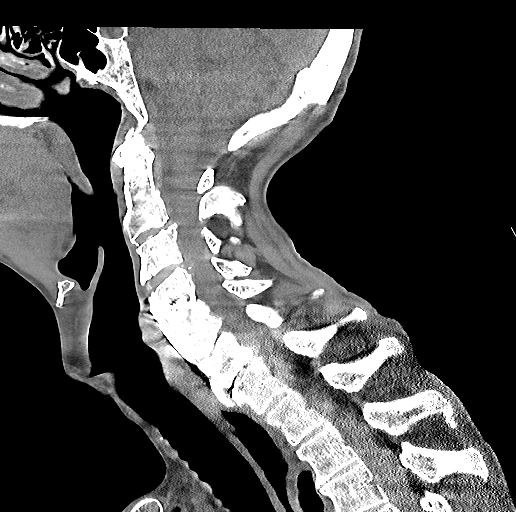
[im 32/63  bone]
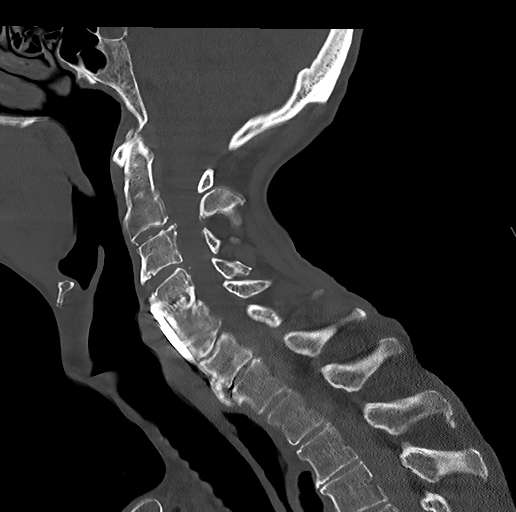
[im 37/63  bone]
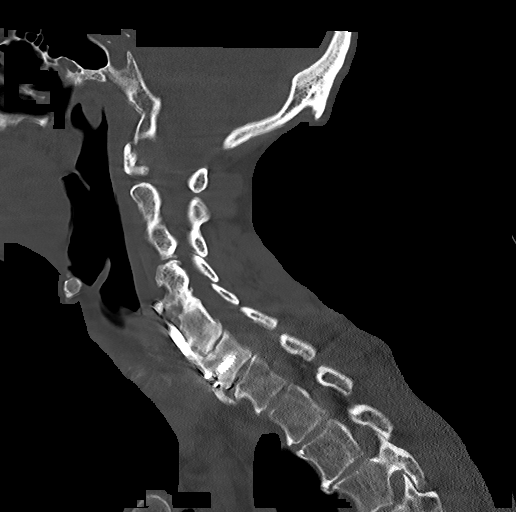
[im 42/63  bone]
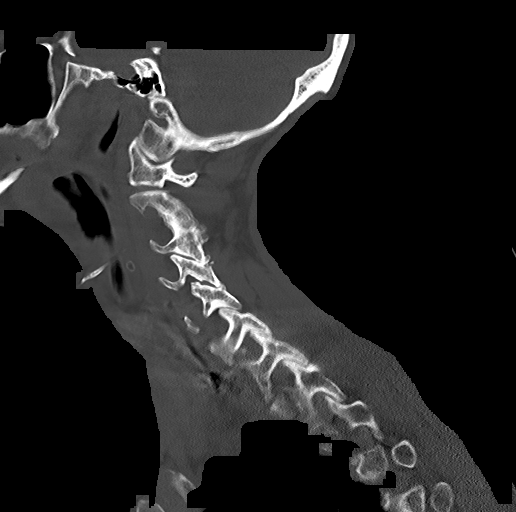

[Series 11: c_spine 2.0 sag bone · coronal · 0.37mm/px · 3 of 57 slices shown (2 of 2)]
[im 12/57  bone]
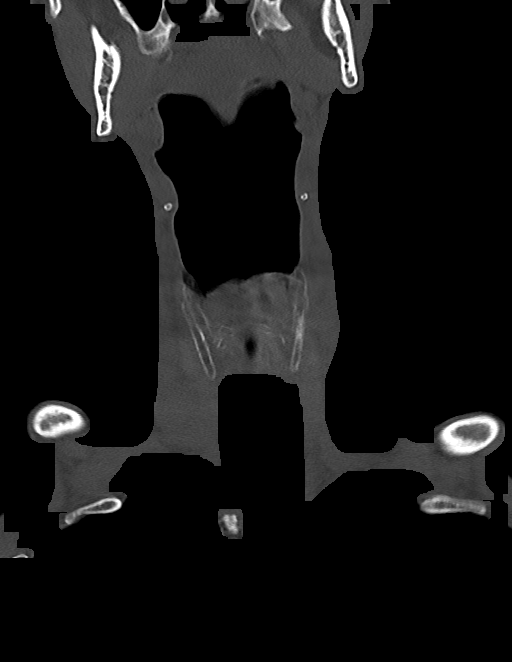
[im 23/57  bone]
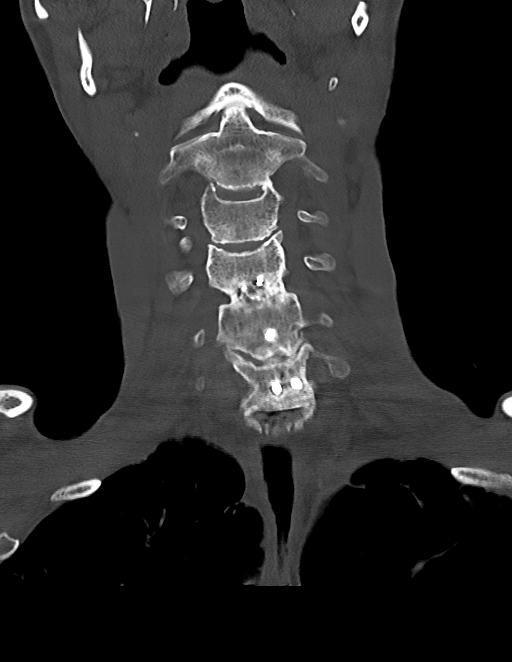
[im 34/57  bone]
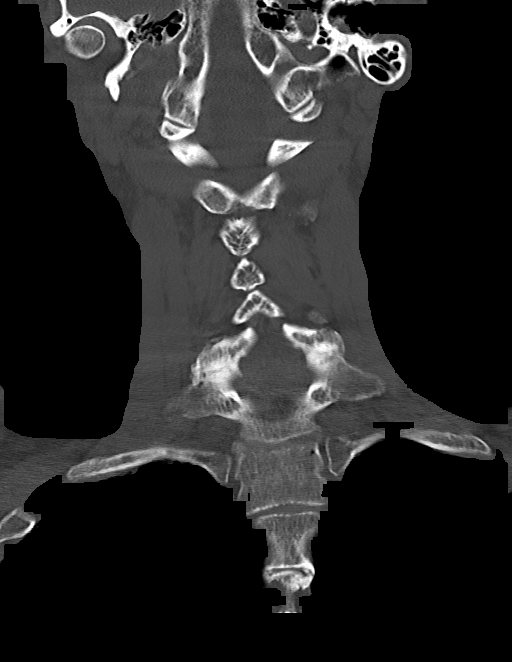

[Series 12: othogonals · axial · 0.35mm/px · z∈[-272,-160]mm · 3 of 125 slices shown, 4 images]
[im 32/125  soft-tissue]
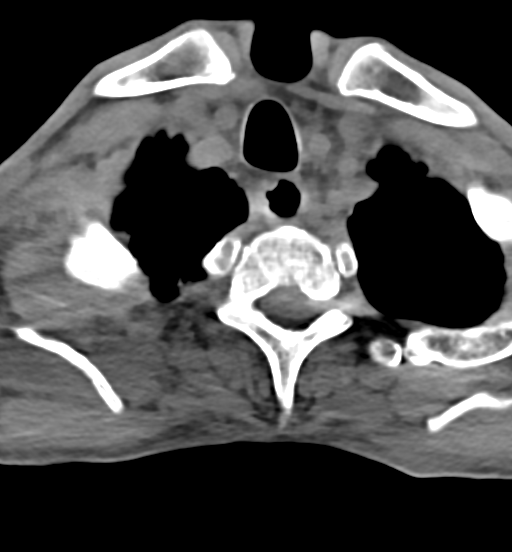
[im 32/125  bone]
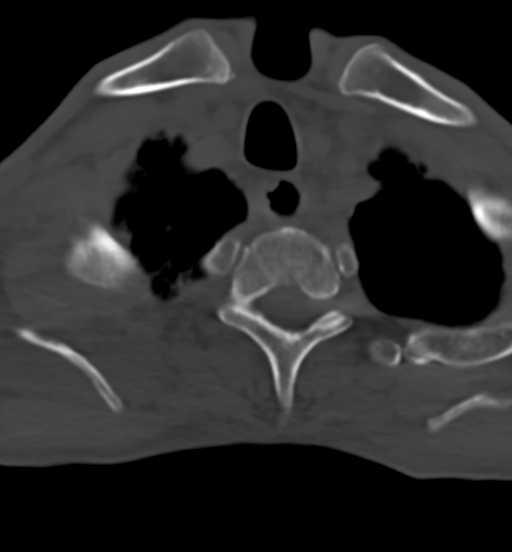
[im 63/125  bone]
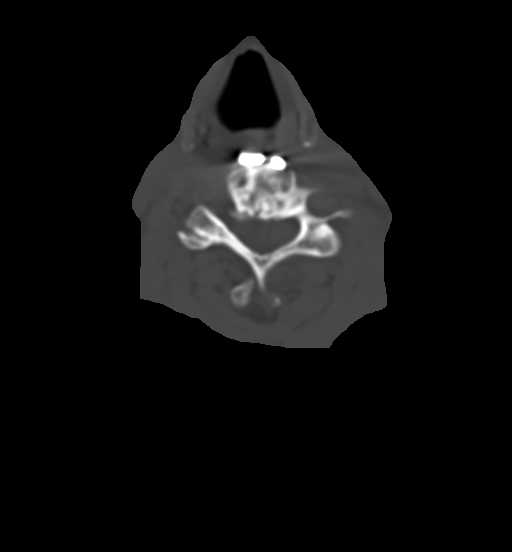
[im 94/125  bone]
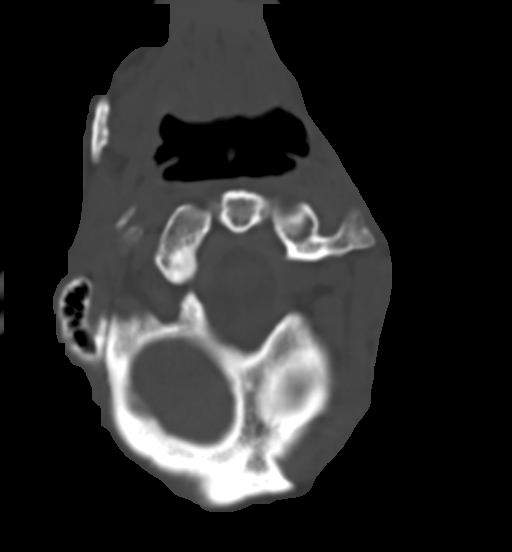

[13 of 35 positions shown; findings below may reference images not displayed]

FINDINGS: CT HEAD FINDINGS

Minimal atrophy.

Normal ventricular morphology.

No midline shift or mass effect.

Normal appearance of brain parenchyma.

No intracranial hemorrhage, mass lesion, or evidence acute
infarction.

No extra-axial fluid collections.

Sinuses clear and bones unremarkable.

CT CERVICAL SPINE FINDINGS

Extensive atherosclerotic calcifications.

Anterior plate and screws at C4-C6 with bony fusion at C4-C5 and
absent bony fusion at C5-C6.

Scattered motion artifacts.

Prevertebral soft tissues normal thickness.

Multilevel disc space narrowing and endplate spur formation greatest
at C6-C7.

Scattered facet degenerative changes.

Vertebral body heights maintained without fracture or subluxation.

Visualized skullbase intact.

Severe emphysematous changes and mild parenchymal lung scarring at
lung apices.
IMPRESSION: No acute intracranial abnormalities.

Prior anterior fusion C4-C6 with absent bony union at C5-C6.

Scattered degenerative disc and facet disease changes.

No acute cervical spine abnormalities.

Severe COPD changes at lung apices.
